# Patient Record
Sex: Female | Born: 1975 | Race: Black or African American | Hispanic: No | Smoking: Former smoker
Health system: Southern US, Community
[De-identification: ages and names within clinical notes are randomized; demographics above are authoritative.]

## PROBLEM LIST (undated history)

## (undated) DIAGNOSIS — D649 Anemia, unspecified: Secondary | ICD-10-CM

## (undated) DIAGNOSIS — F17200 Nicotine dependence, unspecified, uncomplicated: Secondary | ICD-10-CM

## (undated) DIAGNOSIS — E559 Vitamin D deficiency, unspecified: Principal | ICD-10-CM

## (undated) DIAGNOSIS — I10 Essential (primary) hypertension: Secondary | ICD-10-CM

## (undated) DIAGNOSIS — B009 Herpesviral infection, unspecified: Secondary | ICD-10-CM

## (undated) DIAGNOSIS — B379 Candidiasis, unspecified: Secondary | ICD-10-CM

## (undated) DIAGNOSIS — N898 Other specified noninflammatory disorders of vagina: Secondary | ICD-10-CM

## (undated) DIAGNOSIS — K429 Umbilical hernia without obstruction or gangrene: Secondary | ICD-10-CM

## (undated) DIAGNOSIS — Z716 Tobacco abuse counseling: Secondary | ICD-10-CM

## (undated) DIAGNOSIS — F32A Depression, unspecified: Secondary | ICD-10-CM

## (undated) DIAGNOSIS — N760 Acute vaginitis: Secondary | ICD-10-CM

## (undated) DIAGNOSIS — B9689 Other specified bacterial agents as the cause of diseases classified elsewhere: Secondary | ICD-10-CM

## (undated) HISTORY — DX: Herpesviral infection, unspecified: B00.9

## (undated) HISTORY — DX: Candidiasis, unspecified: B37.9

## (undated) HISTORY — DX: Nicotine dependence, unspecified, uncomplicated: F17.200

## (undated) HISTORY — DX: Vitamin D deficiency, unspecified: E55.9

## (undated) HISTORY — DX: Acute vaginitis: N76.0

## (undated) HISTORY — DX: Umbilical hernia without obstruction or gangrene: K42.9

## (undated) HISTORY — DX: Anemia, unspecified: D64.9

## (undated) HISTORY — DX: Tobacco abuse counseling: Z71.6

## (undated) HISTORY — PX: TUBAL LIGATION: SHX77

## (undated) HISTORY — DX: Other specified noninflammatory disorders of vagina: N89.8

## (undated) HISTORY — DX: Other specified bacterial agents as the cause of diseases classified elsewhere: B96.89

---

## 2000-08-24 ENCOUNTER — Emergency Department (HOSPITAL_COMMUNITY): Admission: EM | Admit: 2000-08-24 | Discharge: 2000-08-24 | Payer: Self-pay | Admitting: Emergency Medicine

## 2000-08-24 ENCOUNTER — Encounter: Payer: Self-pay | Admitting: *Deleted

## 2000-10-30 ENCOUNTER — Emergency Department (HOSPITAL_COMMUNITY): Admission: EM | Admit: 2000-10-30 | Discharge: 2000-10-30 | Payer: Self-pay | Admitting: *Deleted

## 2001-05-17 ENCOUNTER — Emergency Department (HOSPITAL_COMMUNITY): Admission: EM | Admit: 2001-05-17 | Discharge: 2001-05-17 | Payer: Self-pay | Admitting: *Deleted

## 2001-09-21 ENCOUNTER — Emergency Department (HOSPITAL_COMMUNITY): Admission: EM | Admit: 2001-09-21 | Discharge: 2001-09-21 | Payer: Self-pay | Admitting: Emergency Medicine

## 2002-02-15 ENCOUNTER — Encounter: Payer: Self-pay | Admitting: Internal Medicine

## 2002-02-15 ENCOUNTER — Ambulatory Visit (HOSPITAL_COMMUNITY): Admission: RE | Admit: 2002-02-15 | Discharge: 2002-02-15 | Payer: Self-pay | Admitting: Internal Medicine

## 2002-03-16 ENCOUNTER — Emergency Department (HOSPITAL_COMMUNITY): Admission: EM | Admit: 2002-03-16 | Discharge: 2002-03-16 | Payer: Self-pay | Admitting: Emergency Medicine

## 2002-03-16 ENCOUNTER — Encounter: Payer: Self-pay | Admitting: Emergency Medicine

## 2002-09-21 ENCOUNTER — Emergency Department (HOSPITAL_COMMUNITY): Admission: EM | Admit: 2002-09-21 | Discharge: 2002-09-21 | Payer: Self-pay | Admitting: Emergency Medicine

## 2002-10-05 ENCOUNTER — Emergency Department (HOSPITAL_COMMUNITY): Admission: EM | Admit: 2002-10-05 | Discharge: 2002-10-05 | Payer: Self-pay | Admitting: Emergency Medicine

## 2003-01-14 ENCOUNTER — Emergency Department (HOSPITAL_COMMUNITY): Admission: EM | Admit: 2003-01-14 | Discharge: 2003-01-14 | Payer: Self-pay | Admitting: Emergency Medicine

## 2003-11-15 ENCOUNTER — Inpatient Hospital Stay (HOSPITAL_COMMUNITY): Admission: AD | Admit: 2003-11-15 | Discharge: 2003-11-15 | Payer: Self-pay | Admitting: *Deleted

## 2004-04-26 ENCOUNTER — Emergency Department (HOSPITAL_COMMUNITY): Admission: EM | Admit: 2004-04-26 | Discharge: 2004-04-26 | Payer: Self-pay | Admitting: *Deleted

## 2004-05-24 ENCOUNTER — Emergency Department (HOSPITAL_COMMUNITY): Admission: EM | Admit: 2004-05-24 | Discharge: 2004-05-24 | Payer: Self-pay | Admitting: Emergency Medicine

## 2004-07-14 ENCOUNTER — Emergency Department (HOSPITAL_COMMUNITY): Admission: EM | Admit: 2004-07-14 | Discharge: 2004-07-14 | Payer: Self-pay | Admitting: Emergency Medicine

## 2004-11-20 ENCOUNTER — Emergency Department (HOSPITAL_COMMUNITY): Admission: EM | Admit: 2004-11-20 | Discharge: 2004-11-20 | Payer: Self-pay | Admitting: Emergency Medicine

## 2005-05-04 ENCOUNTER — Ambulatory Visit: Payer: Self-pay | Admitting: Obstetrics and Gynecology

## 2005-05-04 ENCOUNTER — Inpatient Hospital Stay (HOSPITAL_COMMUNITY): Admission: AD | Admit: 2005-05-04 | Discharge: 2005-05-08 | Payer: Self-pay | Admitting: Obstetrics and Gynecology

## 2005-05-05 ENCOUNTER — Encounter (INDEPENDENT_AMBULATORY_CARE_PROVIDER_SITE_OTHER): Payer: Self-pay | Admitting: *Deleted

## 2005-05-14 ENCOUNTER — Observation Stay (HOSPITAL_COMMUNITY): Admission: AD | Admit: 2005-05-14 | Discharge: 2005-05-15 | Payer: Self-pay | Admitting: Obstetrics and Gynecology

## 2006-01-16 ENCOUNTER — Emergency Department (HOSPITAL_COMMUNITY): Admission: EM | Admit: 2006-01-16 | Discharge: 2006-01-16 | Payer: Self-pay | Admitting: Emergency Medicine

## 2006-01-23 ENCOUNTER — Emergency Department (HOSPITAL_COMMUNITY): Admission: EM | Admit: 2006-01-23 | Discharge: 2006-01-23 | Payer: Self-pay | Admitting: Emergency Medicine

## 2006-09-16 ENCOUNTER — Emergency Department (HOSPITAL_COMMUNITY): Admission: EM | Admit: 2006-09-16 | Discharge: 2006-09-16 | Payer: Self-pay | Admitting: Emergency Medicine

## 2007-02-24 ENCOUNTER — Encounter (INDEPENDENT_AMBULATORY_CARE_PROVIDER_SITE_OTHER): Payer: Self-pay | Admitting: Unknown Physician Specialty

## 2007-02-24 ENCOUNTER — Other Ambulatory Visit: Admission: RE | Admit: 2007-02-24 | Discharge: 2007-02-24 | Payer: Self-pay | Admitting: Unknown Physician Specialty

## 2007-03-26 ENCOUNTER — Emergency Department (HOSPITAL_COMMUNITY): Admission: EM | Admit: 2007-03-26 | Discharge: 2007-03-26 | Payer: Self-pay | Admitting: Emergency Medicine

## 2008-06-27 ENCOUNTER — Emergency Department (HOSPITAL_COMMUNITY): Admission: EM | Admit: 2008-06-27 | Discharge: 2008-06-28 | Payer: Self-pay | Admitting: Emergency Medicine

## 2008-06-28 ENCOUNTER — Ambulatory Visit (HOSPITAL_COMMUNITY): Admission: RE | Admit: 2008-06-28 | Discharge: 2008-06-28 | Payer: Self-pay | Admitting: Emergency Medicine

## 2009-03-26 ENCOUNTER — Emergency Department (HOSPITAL_COMMUNITY): Admission: EM | Admit: 2009-03-26 | Discharge: 2009-03-26 | Payer: Self-pay | Admitting: Emergency Medicine

## 2009-08-29 ENCOUNTER — Emergency Department (HOSPITAL_COMMUNITY): Admission: EM | Admit: 2009-08-29 | Discharge: 2009-08-29 | Payer: Self-pay | Admitting: Emergency Medicine

## 2009-12-10 ENCOUNTER — Emergency Department (HOSPITAL_COMMUNITY)
Admission: EM | Admit: 2009-12-10 | Discharge: 2009-12-10 | Payer: Self-pay | Source: Home / Self Care | Admitting: Emergency Medicine

## 2010-01-15 ENCOUNTER — Other Ambulatory Visit
Admission: RE | Admit: 2010-01-15 | Discharge: 2010-01-15 | Payer: Self-pay | Source: Home / Self Care | Admitting: Obstetrics and Gynecology

## 2010-07-05 LAB — DIFFERENTIAL
Basophils Absolute: 0 10*3/uL (ref 0.0–0.1)
Eosinophils Absolute: 0.1 10*3/uL (ref 0.0–0.7)
Lymphs Abs: 2.2 10*3/uL (ref 0.7–4.0)
Monocytes Absolute: 0.6 10*3/uL (ref 0.1–1.0)
Monocytes Relative: 9 % (ref 3–12)

## 2010-07-05 LAB — CBC
MCV: 83.2 fL (ref 78.0–100.0)
Platelets: 172 10*3/uL (ref 150–400)
RDW: 13.9 % (ref 11.5–15.5)
WBC: 6.2 10*3/uL (ref 4.0–10.5)

## 2010-07-05 LAB — URINALYSIS, ROUTINE W REFLEX MICROSCOPIC
Glucose, UA: NEGATIVE mg/dL
Leukocytes, UA: NEGATIVE
Specific Gravity, Urine: >1.03 — ABNORMAL HIGH (ref 1.005–1.030)
pH: 6 (ref 5.0–8.0)

## 2010-07-05 LAB — BASIC METABOLIC PANEL
BUN: 9 mg/dL (ref 6–23)
Chloride: 103 mEq/L (ref 96–112)
Creatinine, Ser: 0.46 mg/dL (ref 0.4–1.2)

## 2010-07-05 LAB — URINE MICROSCOPIC-ADD ON

## 2010-07-10 LAB — URINE CULTURE

## 2010-07-10 LAB — URINALYSIS, ROUTINE W REFLEX MICROSCOPIC
Glucose, UA: NEGATIVE mg/dL
Specific Gravity, Urine: 1.015 (ref 1.005–1.030)
pH: 6.5 (ref 5.0–8.0)

## 2010-07-10 LAB — URINE MICROSCOPIC-ADD ON

## 2010-07-10 LAB — WET PREP, GENITAL: Yeast Wet Prep HPF POC: NONE SEEN

## 2010-07-26 ENCOUNTER — Encounter (HOSPITAL_COMMUNITY)
Admission: RE | Admit: 2010-07-26 | Discharge: 2010-07-26 | Disposition: A | Discharge status: Home / Self Care | Payer: Medicaid Other | Source: Ambulatory Visit | Attending: Obstetrics and Gynecology | Admitting: Obstetrics and Gynecology

## 2010-07-26 LAB — CBC
Hemoglobin: 11.1 g/dL — ABNORMAL LOW (ref 12.0–15.0)
MCH: 26.2 pg (ref 26.0–34.0)
MCV: 81.4 fL (ref 78.0–100.0)
RBC: 4.24 MIL/uL (ref 3.87–5.11)
WBC: 5.5 10*3/uL (ref 4.0–10.5)

## 2010-07-26 LAB — SURGICAL PCR SCREEN
MRSA, PCR: NEGATIVE
Staphylococcus aureus: NEGATIVE

## 2010-07-26 LAB — URINALYSIS, ROUTINE W REFLEX MICROSCOPIC
Bilirubin Urine: NEGATIVE
Glucose, UA: NEGATIVE mg/dL
Ketones, ur: NEGATIVE mg/dL
Specific Gravity, Urine: 1.015 (ref 1.005–1.030)
pH: 6 (ref 5.0–8.0)

## 2010-07-26 LAB — URINE MICROSCOPIC-ADD ON

## 2010-08-01 ENCOUNTER — Inpatient Hospital Stay (HOSPITAL_COMMUNITY)
Admission: RE | Admit: 2010-08-01 | Discharge: 2010-08-04 | DRG: 766 | Disposition: A | Discharge status: Home / Self Care | Payer: Medicaid Other | Source: Ambulatory Visit | Attending: Obstetrics and Gynecology | Admitting: Obstetrics and Gynecology

## 2010-08-01 DIAGNOSIS — Z01818 Encounter for other preprocedural examination: Secondary | ICD-10-CM

## 2010-08-01 DIAGNOSIS — Z01812 Encounter for preprocedural laboratory examination: Secondary | ICD-10-CM

## 2010-08-01 DIAGNOSIS — O34219 Maternal care for unspecified type scar from previous cesarean delivery: Principal | ICD-10-CM | POA: Diagnosis present

## 2010-08-01 DIAGNOSIS — Z302 Encounter for sterilization: Secondary | ICD-10-CM

## 2010-08-02 LAB — PREGNANCY, URINE: Preg Test, Ur: NEGATIVE

## 2010-08-02 LAB — URINALYSIS, ROUTINE W REFLEX MICROSCOPIC
Bilirubin Urine: NEGATIVE
Nitrite: NEGATIVE
Specific Gravity, Urine: 1.025 (ref 1.005–1.030)
Urobilinogen, UA: 0.2 mg/dL (ref 0.0–1.0)
pH: 6 (ref 5.0–8.0)

## 2010-08-02 LAB — CBC
MCH: 26.3 pg (ref 26.0–34.0)
MCV: 81.9 fL (ref 78.0–100.0)
Platelets: 155 10*3/uL (ref 150–400)
RBC: 4.03 MIL/uL (ref 3.87–5.11)
RDW: 15.5 % (ref 11.5–15.5)
WBC: 6.9 10*3/uL (ref 4.0–10.5)

## 2010-08-03 LAB — RH IMMUNE GLOB WKUP(>/=20WKS)(NOT WOMEN'S HOSP)

## 2010-08-03 NOTE — Op Note (Signed)
[name], [name]             ACCOUNT NO.:  [id number]  MEDICAL RECORD NO.:  [id number]           PATIENT TYPE:  I  LOCATION:  9107                          [facility]ILITY:  [facility]  PHYSICIAN:  Tilda Burrow, M.D. DATE OF BIRTH:  [birth year]  DATE OF PROCEDURE:  08/01/2010 DATE OF DISCHARGE:                              OPERATIVE REPORT   PREOPERATIVE DIAGNOSES:  Pregnancy atm39 weeks, prior cesarean section x1, desiring repeat cesarean section, elective permanent tubal sterilization.  POSTOPERATIVE DIAGNOSES:  Pregnancy atm39 weeks, prior cesarean section x1, desiring repeat cesarean section, elective permanent tubal sterilization.  PROCEDURE:  Repeat low-transverse cervical cesarean section, bilateral sterilization by Filshie clip placement.  SURGEON:  Tilda Burrow, MD, Orvan Falconer, MD  ANESTHESIA:  Spinal.  COMPLICATIONS:  None.  FINDINGS:  8 pounds 5 ounces female infant, Apgars 9 and 9.  INDICATIONS:  A 35 year old female with prior vaginal delivery of small infant and then a cesarean section for her second infant, scheduled for repeat C-section, tubal ligation.  The patient has a very short body habitus and protuberant abdominal wall, as I felt that vaginal delivery would be the technically challenging and not desired by the patient.  DETAILS OF PROCEDURE:  The patient was taken to the operating room, prepped and draped.  Spinal anesthesia introduced.  Time-out conducted, and procedure confirmed by all involved parties.  Ancef 1 g IV was administered preprocedure.  Transverse lower abdominal incision was repeated excising the old cicatrix.  The fascia was opened in the midline.  There was underlying rectus diastase present, and peritoneum was closed over the abdominal cavity.  The peritoneum was opened transversely to stay away from the bladder.  Fascia was dissected off the underlying rectus muscles to give a 12-cm opening to deliver the infant.  Uterine incision  was transverse, fetal vertex rotated into the incision, delivered with fundal pressure, and cord clamped and infant passed to waiting pediatrician.  Cord blood samples were obtained and placenta delivered in response to Crede uterine massage.  There was a marginal cord insertion.  Membranes were extracted and in toto.  Uterus was closed with single layer of running locking 0 Vicryl.  Abdomen was confirmed as being hemostatic.  Tubal sterilization was then performed.  Each tube was identified to its fimbriated end with a midsegment portion of tube captured in a Filshie clip with good positioning confirmed visually.  Anterior peritoneum was closed in a midline vertical position using 2-0 Vicryl.  The rectus muscles were then pulled into the midline using interrupted 0 chromic x2, then 0 Vicryl used to close the fascia in a running fashion.  Figure-of-eight suture of 0 Vicryl was placed in the midline due to a slight separation in the fascial incision.  This was thought due to the scarring encountered on way in.  Subcu tissues were released inferiorly so that 2-0 Vicryl sutures pulled the subcu fatty tissues into reasonable approximation and then subcuticular 4-0 Vicryl on the [place] needle was used to close the skin incision with good cosmetic results.  Pressure dressing will be applied. The patient will go to the recovery room in good condition.  Tilda Burrow, M.D.     JVF/MEDQ  D:  08/01/2010  T:  08/02/2010  Job:  [id number]  cc:   [facility]  [facility]  Electronically Signed by Christin Bach M.D. on 08/03/2010 02:42:04 PM

## 2010-08-03 NOTE — H&P (Signed)
  [name], [name]             ACCOUNT NO.:  [id number]  MEDICAL RECORD NO.:  [id number]           PATIENT TYPE:  LOCATION:                                 FACILITY:  PHYSICIAN:  Tilda Burrow, M.D. DATE OF BIRTH:  11/10/1975  DATE OF ADMISSION:  08/01/2010 DATE OF DISCHARGE:                             HISTORY & PHYSICAL   ADMITTING DIAGNOSES:  Pregnancy [redacted] weeks gestation, prior cesarean section x1 desiring repeat cesarean section, and tubal sterilization.  HISTORY OF PRESENT ILLNESS:  This 35 year old female gravida 3, para 2-0- 0-2, LMP unsure with ultrasound EDC of August 08, 2010 based on 7-week ultrasound and confirmed at 19 weeks 3 days and at 29 weeks is admitted at [redacted] weeks gestation for repeat cesarean section and tubal ligation. Tubal sterilization forms have been signed in our office, June 25, 2010 and confirmed again in prior discussions with the patient.  She declines consideration of trial of labor and will be scheduled for repeat C- section on 3:30 p.m. on August 01, 2010.  PAST MEDICAL HISTORY:  Benign.  PAST SURGICAL HISTORY:  Cesarean section for breech infant in 2007 at [facility] notable for postpartum hypertension.  In 2001, she had a vaginal delivery after 15-hour labor, delivering 6 pounds 3 ounces infant female by vaginal delivery.  ALLERGIES:  None known.  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  FAMILY HISTORY:  Hypertension.  PRENATAL LABORATORY DATA:  Blood type AB negative, receiving RhoGAM April 07, 2010, rubella immune present, hemoglobin 12, hematocrit 38, platelets 200,000 then rechecked at 171,000 thousand.  Hepatitis, HIV, RPR, GC and Chlamydia all negative.  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  MSAFP was declined and 2- hour glucose tolerance test normal at 75, 111, and 97.  She had a female infant, desires circumcision postpartum through [facility],  desires permanent sterilization.  PHYSICAL EXAMINATION:  GENERAL:  Healthy, short-statured African American female. VITAL SIGNS:  Height 4 feet 11 inches and weight 197.4. HEENT:  Pupils equal, round, and reactive. NECK:  Supple. CARDIOVASCULAR:  Unremarkable. ABDOMEN:  Gravid uterus, very protuberant anterior abdominal wall, 37 cm fundal height, and estimated fetal weight 6 pounds. Cervix:  Long, closed, high. EXTREMITIES:  2+ edema without cyanosis or clubbing.  PLAN:  Repeat C-section and tubal ligation, August 01, 2010 at 3:30 p.m.     Tilda Burrow, M.D.     JVF/MEDQ  D:  07/30/2010  T:  07/31/2010  Job:  [id number]  cc:   [facility]  Electronically Signed by Christin Bach M.D. on 08/03/2010 02:41:57 PM

## 2010-08-08 NOTE — Discharge Summary (Signed)
  [name], [name]             ACCOUNT NO.:  [id number]  MEDICAL RECORD NO.:  [id number]           PATIENT TYPE:  I  LOCATION:  9107                          [facility]  [facility]  PHYSICIAN:  Tilda Burrow, M.D. DATE OF BIRTH:  15-Mar-1976  DATE OF ADMISSION:  08/01/2010 DATE OF DISCHARGE:                              DISCHARGE SUMMARY   REASON FOR ADMISSION:  Repeat C-section and tubal ligation.  PROCEDURES PRENATAL:  None.  PROCEDURES INTRAPARTUM:  Low cervical transverse cesarean section, tubal ligation.  PROCEDURES POSTPARTUM:  None.  COMPLICATIONS:  Operative and postpartum none.  DISCHARGE DIAGNOSIS:  Term pregnancy, delivered.  DISCHARGE INFORMATION:  Discharge on August 04, 2010.  ACTIVITY:  No heavy lifting.  DIET:  Routine.  MEDICATIONS:  None.  STATUS:  Well.  INSTRUCTIONS:  Routine.  Discharge to home.  Follow up in 6 weeks in [facility].  C-section was without complications and delivered a viable female with Apgar as of 9 and 9 with a stable postoperative CBC, and stable vital signs.    ______________________________ Edd Arbour, MD   ______________________________ Tilda Burrow, M.D.   JO/MEDQ  D:  08/04/2010  T:  08/04/2010  Job:  [id number]  Electronically Signed by Edd Arbour MD on 08/07/2010 09:50:21 PM Electronically Signed by Christin Bach M.D. on 08/08/2010 04:31:51 PM

## 2010-09-07 NOTE — H&P (Signed)
NAME:  [name], AZIZI NO.:  [id number]   MEDICAL RECORD NO.:  [id number]          PATIENT TYPE:  INP   LOCATION:  A428                          FACILITY:  [facility]   PHYSICIAN:  Tilda Burrow, M.D. DATE OF BIRTH:  [birth year]   DATE OF ADMISSION:  DATE OF DISCHARGE:  LH                                HISTORY & PHYSICAL   ADMITTING DIAGNOSIS:  Pregnancy-induced hypertension.   HISTORY:  This 35 year old female, gravida 1, para 1, now 11 days status  post cesarean section to [facility] for breech presentation with  membrane rupture, is admitted at this time for management of postpartum  preeclampsia.  She presents to our office complaining of headache since she  woke up this morning.  Denies right upper quadrant pain.  She also had an  odor from the incision, which is actually why she came in.  On physical  exam, her weight is 158.  Blood pressure 190/110 (rechecked after sitting  for some time at 176/110 to 112).  Urine is bloody, but shows trace protein,  2+ blood, 2+ leukocytes.  Reflexes show 3+ reflexes without clonus.  She has  no right upper quadrant pain.   Given the absence of any idea of how her blood pressures have been recently,  we are admitting her for treatment of her presumed preeclampsia.   LABORATORY DATA:  Liver function tests and cath urinalysis will be  performed.   She will be begun on labetalol 100 mg b.i.d. with the addition of Lasix if  necessary.  I anticipate a brief hospitalization and conversion to  outpatient therapy with blood pressures improved.   PAST MEDICAL HISTORY:  Benign.  Specifically, blood pressure is not  mentioned as elevated at recent delivery.   SURGICAL HISTORY:  Cesarean section at [facility] 11 days ago.   PHYSICAL EXAMINATION:  VITAL SIGNS:  Weight 158.  Blood pressure 190/110  (rechecked at 176/112).  HEENT:  Pupils equally round and reactive.  Not particularly edematous.  NECK:  Supple.  CHEST:   Clear to auscultation.  ABDOMEN:  No right upper quadrant pain on abdominal exam.  Lower abdominal  laxity traps moisture over the incision, which has a large flap where the  skin edge is separated, and the superior edge of the incision has developed  approximately a 2 cm long flap protruding over the skin edge of the lower  portion of the incision.  PELVIC:  External genitalia is normal.  Pelvic exam specifically denied.  EXTREMITIES:  Not edematous past 1+, but have 4+ reflexes without clonus.   PLAN:  Admit.  Magnesium sulfate therapy overnight.  Convert to outpatient  management once labetalol has taken effect.   ADDENDUM:  The patient is bottle feeding.      Tilda Burrow, M.D.  Electronically Signed     JVF/MEDQ  D:  05/14/2005  T:  05/14/2005  Job:  [id number]

## 2010-09-07 NOTE — Discharge Summary (Signed)
[name], [name] NO.:  [id number]   MEDICAL RECORD NO.:  [id number]          PATIENT TYPE:  INP   LOCATION:  9125                          [facility]ILITY:  [facility]   PHYSICIAN:  Phil D. Okey Dupre, M.D.     DATE OF BIRTH:  11/09/1975   DATE OF ADMISSION:  05/04/2005  DATE OF DISCHARGE:  05/08/2005                                 DISCHARGE SUMMARY   DISCHARGE DIAGNOSES:  Intrauterine pregnancy at 33-2/7 weeks, delivered by  low transverse cesarean section due to spontaneous onset of labor in  footling breech positioning performed on May 05, 2005.   DISCHARGE MEDICATIONS:  1.  Colace 100 mg one tablet p.o. daily as stool softener.  2.  Ibuprofen 600 mg one tablet q.6h. p.r.n. pain.  3.  Percocet 5/325 one tablet p.r.n. q.6h. p.r.n. for severe pain.   BRIEF HOSPITAL COURSE:  [name] is a 35 year old gravida 2, para 1-0-  0-1 who presented with premature rupture of membranes on May 04, 2005 to  the MAU with a dilated cervix of 2 cm, ruptured membranes and with a  footling breech presentation.  It was decided to take the patient back for a  low transverse cesarean section to be performed by Dr. Okey Dupre and Dr.  Irving Burton.  Please see the dictated operative note on May 04, 2005.  Patient had estimated blood loss of 500 mL.  Baby weighed 5 pounds 6 ounces,  viable female in footling breech presentation.  Cesarean section was carried  out under spinal anesthesia.  The patient will be bottle feeding.  The  Apgars were 7 and 9.  Placenta was removed manually.  Patient was taken to  the PACU in stable condition and was then transferred out to mother/baby  floor.  Patient did well status post delivery without complications.  Blood  pressures were stable.  Blood type was AB-.  Antibody was negative.  Baby  had a positive antibody and was given RhoGAM status post discharge.  Patient's RPR was nonreactive.  She was rubella immune.  Her hepatitis B  surface antigen was  negative.  HIV was nonreactive.  Her postpartum  hemoglobin was 9.7 on May 06, 2005.  The patient will be using Depo  Provera 150 mg IM x1 prior to discharge.  Her staples will be removed prior  to discharge.  She will follow up with Dr. Emelda Fear at [facility]  outside of [facility] in [place] for her six-week postpartum  check.  Patient is stable now, ready for discharge.      Barth Kirks, M.D.    ______________________________  Javier Glazier. Okey Dupre, M.D.    MB/MEDQ  D:  05/08/2005  T:  05/08/2005  Job:  [id number]   cc:   Tilda Burrow, M.D.  Fax: [phone]

## 2010-09-07 NOTE — Discharge Summary (Signed)
NAME:  [name], FULTS NO.:  [id number]   MEDICAL RECORD NO.:  [id number]          PATIENT TYPE:  OIB   LOCATION:  A415                          FACILITY:  [facility]   PHYSICIAN:  Lazaro Arms, M.D.   DATE OF BIRTH:  [birth year]   DATE OF ADMISSION:  05/04/2005  DATE OF DISCHARGE:  LH                                 DISCHARGE SUMMARY   ADMISSION AND TRANSFER SUMMARY:  [name] is a 35 year old African-American  female gravida 2, para 1 with estimated date of delivery of June 20, 2005 by  a sure last menstrual period and confirmatory 6-week ultrasound currently at  32 and 2/[redacted] weeks gestation who presented to labor and delivery tonight  complaining of spontaneous rupture of membranes at 1900.  When she presented  to labor and delivery, she was brought up with a towel because she was  leaking so much.  She is Nitrazine positive.  A sterile speculum exam here  in labor and delivery confirms gross ruptured membranes.  An ultrasound also  reveals the baby to be in a breech presentation.  It appears to be  incomplete breech.  On cervical exam, she was not prolapsing the foot, but  small parts could be felt so I think she is incomplete with no leg prolapse.  She was not reassessed a second time to try to diminish cervical exams.  Group B strep culture is done of the vagina and perineum.  She was started  on ampicillin after the culture was obtained.  I called [facility] and  talked with Dr. Okey Dupre, and she is being transferred because of pediatric  possible need for respiratory support in a neonatal ICU.   PAST MEDICAL HISTORY:  Negative.   PAST SURGICAL HISTORY:  Negative.   ALLERGIES:  None.   MEDICATIONS:  Prenatal vitamins.   PAST OBSTETRIC HISTORY:  She had a vaginal delivery back in 2001 of 6 pound  13 ounce at 40 weeks.  No pregnancy complications.  Her blood type is AB  negative.  Antibody screen is negative.  UDS was negative.  Hepatitis B was  negative.   HIV was non reactive.  Serology was non reactive both at the  beginning of pregnancy and at 28 weeks.  Her Pap was normal.  GC and  chlamydia were negative.  She declined an AFP test.  Her Glucola was normal.   Abdominal exam reveals a rectus diastasis.  She is in __________ breech  presentation which is confirmed by ultrasound, and she is found to be in  incomplete breech with no prolapse of the foot through the cervix.   IMPRESSION:  1.  Intrauterine pregnancy at 40 and 2/[redacted] weeks gestation.  2.  Premature ruptured membranes.  3.  Mild uterine activity status post terbutaline times 1.   PLAN:  The patient is being readied for transfer to [facility].  She  has had a group B strep culture obtained.  Her ampicillin is hanging.  She  understands that she will require cesarean section for delivery because of  the presentation of the baby.  She also understands the reason for being  transferred to [facility] and agrees.      Lazaro Arms, M.D.  Electronically Signed     LHE/MEDQ  D:  05/04/2005  T:  05/04/2005  Job:  [id number]

## 2010-09-07 NOTE — Op Note (Signed)
NAMEJAMERIAH, TROTTI NO.:  [id number]   MEDICAL RECORD NO.:  [id number]          PATIENT TYPE:  INP   LOCATION:  9125                          [facility]ILITY:  [facility]   PHYSICIAN:  Phil D. Okey Dupre, M.D.     DATE OF BIRTH:  27-Dec-1975   DATE OF PROCEDURE:  05/05/2005  DATE OF DISCHARGE:                                 OPERATIVE REPORT   PROCEDURE:  Low transverse C-section.   PREOPERATIVE DIAGNOSIS:  33-2/7 weeks' gestation, breech presentation with  premature ruptured membranes.   SURGEON:  Dr. Okey Dupre   FIRST ASSISTANT:  Angeline Slim, M.D.   ANESTHESIA:  Was spinal.   ESTIMATED BLOOD LOSS:  500 mL.   SPECIMENS:  Placenta to pathology.   FINDINGS:  Pecola Leisure was female weighing 5 pounds 6 ounces.   DESCRIPTION OF PROCEDURE:  Under satisfactory spinal anesthesia with the  patient in dorsolithotomy position. A Foley catheter in urinary bladder, the  abdomen was entered by a transverse semilunar incision situated 3 cm above  the symphysis pubis extending for a total of 14 cm. Abdomen was entered by  layers. On entering the peritoneal cavity visceroperitoneum and the anterior  surface were opened transversely by sharp dissection. Bladder pushed away  the lower uterine segment which was entered by sharp and blunt dissection  and from a single footling presentation with a LSA, the baby was delivered  by breech extraction. The airway suctioned with bulb suction. Cord doubly  clamped and divided, baby handed pediatrician. Samples taken from the cord  for analysis. Placenta manually removed. The uterus explored and closed with  continuous running locked 0 Vicryl in an atraumatic needle. There was a  bleeder in the area of the left lateral angle of the incision which was  controlled with a figure-of-eight of the same material. Fascia was then  closed with continuous running locked 0 Vicryl on an atraumatic needle.  Subcutaneous bleeders controlled with hot cautery. Dry sterile  dressing was  applied. Each layer was irrigated on exit. The patient transferred to  recovery room in satisfactory condition with 500 mL blood loss. Tape,  instrument, sponge and needle count reported correct at the end of the  procedure.           ______________________________  Javier Glazier. Okey Dupre, M.D.     PDR/MEDQ  D:  05/05/2005  T:  05/06/2005  Job:  [id number]

## 2010-10-15 ENCOUNTER — Emergency Department (HOSPITAL_COMMUNITY)
Admission: EM | Admit: 2010-10-15 | Discharge: 2010-10-16 | Disposition: A | Discharge status: Home / Self Care | Payer: Medicaid Other | Attending: Emergency Medicine | Admitting: Emergency Medicine

## 2010-10-15 DIAGNOSIS — I10 Essential (primary) hypertension: Secondary | ICD-10-CM | POA: Insufficient documentation

## 2011-01-28 LAB — STREP A DNA PROBE

## 2011-01-28 LAB — RAPID STREP SCREEN (MED CTR MEBANE ONLY): Streptococcus, Group A Screen (Direct): NEGATIVE

## 2011-02-08 ENCOUNTER — Emergency Department (HOSPITAL_COMMUNITY)
Admission: EM | Admit: 2011-02-08 | Discharge: 2011-02-08 | Disposition: A | Discharge status: Home / Self Care | Payer: Medicaid Other | Attending: Emergency Medicine | Admitting: Emergency Medicine

## 2011-02-08 ENCOUNTER — Encounter: Payer: Self-pay | Admitting: Emergency Medicine

## 2011-02-08 DIAGNOSIS — M545 Low back pain, unspecified: Secondary | ICD-10-CM | POA: Insufficient documentation

## 2011-02-08 DIAGNOSIS — M549 Dorsalgia, unspecified: Secondary | ICD-10-CM

## 2011-02-08 DIAGNOSIS — I10 Essential (primary) hypertension: Secondary | ICD-10-CM | POA: Insufficient documentation

## 2011-02-08 DIAGNOSIS — M546 Pain in thoracic spine: Secondary | ICD-10-CM | POA: Insufficient documentation

## 2011-02-08 DIAGNOSIS — Y9269 Other specified industrial and construction area as the place of occurrence of the external cause: Secondary | ICD-10-CM | POA: Insufficient documentation

## 2011-02-08 DIAGNOSIS — X500XXA Overexertion from strenuous movement or load, initial encounter: Secondary | ICD-10-CM | POA: Insufficient documentation

## 2011-02-08 HISTORY — DX: Essential (primary) hypertension: I10

## 2011-02-08 LAB — URINALYSIS, ROUTINE W REFLEX MICROSCOPIC
Leukocytes, UA: NEGATIVE
Nitrite: NEGATIVE
Specific Gravity, Urine: 1.015 (ref 1.005–1.030)
pH: 7 (ref 5.0–8.0)

## 2011-02-08 LAB — POCT PREGNANCY, URINE: Preg Test, Ur: NEGATIVE

## 2011-02-08 MED ORDER — HYDROCODONE-ACETAMINOPHEN 5-325 MG PO TABS
1.0000 | ORAL_TABLET | Freq: Once | ORAL | Status: AC
  Administered 2011-02-08: 1 via ORAL
  Filled 2011-02-08: qty 1

## 2011-02-08 MED ORDER — CYCLOBENZAPRINE HCL 10 MG PO TABS: 10.0000 mg | ORAL_TABLET | Freq: Three times a day (TID) | ORAL | Status: AC | PRN

## 2011-02-08 MED ORDER — CYCLOBENZAPRINE HCL 10 MG PO TABS
10.0000 mg | ORAL_TABLET | Freq: Once | ORAL | Status: AC
  Administered 2011-02-08: 10 mg via ORAL
  Filled 2011-02-08 (×2): qty 1

## 2011-02-08 MED ORDER — HYDROCODONE-ACETAMINOPHEN 5-325 MG PO TABS: ORAL_TABLET | ORAL | Status: AC

## 2011-02-08 NOTE — ED Notes (Signed)
Pt c/o mid back pain since yesterday.

## 2011-02-08 NOTE — ED Provider Notes (Signed)
History     CSN: [id number] Arrival date & time: 02/08/2011  4:02 PM   First MD Initiated Contact with Patient 02/08/11 1617      Chief Complaint  Patient presents with  . Back Pain    (Consider location/radiation/quality/duration/timing/severity/associated sxs/prior treatment) HPI Comments: Patient c/o pain to her middle and lower back since yesterday.  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  Thinks she may have injured her back at work.  She denies pain or numbness to her legs, chest or abdomen.  Pain is reproduced with movement, bending over or twisting.  She also denies incontinence or dysuria.    Patient is a 35 y.o. female presenting with back pain. The history is provided by the patient.  Back Pain  This is a new problem. The current episode started yesterday. The problem occurs constantly. The problem has not changed since onset.The pain is associated with lifting heavy objects and twisting. The pain is present in the lumbar spine and thoracic spine. The quality of the pain is described as aching. The pain does not radiate. The pain is moderate. The symptoms are aggravated by bending, twisting and certain positions. The pain is the same all the time. Pertinent negatives include no chest pain, no fever, no numbness, no abdominal pain, no abdominal swelling, no bowel incontinence, no perianal numbness, no bladder incontinence, no dysuria, no pelvic pain, no leg pain, no paresis, no tingling and no weakness. She has tried nothing for the symptoms. The treatment provided no relief.    Past Medical History  Diagnosis Date  . Hypertension     Past Surgical History  Procedure Date  . Cesarean section     History reviewed. No pertinent family history.  History  Substance Use Topics  . Smoking status: Current Everyday Smoker  . Smokeless tobacco: Not on file  . Alcohol Use: No    OB History    Grav Para Term Preterm Abortions TAB SAB Ect Mult  Living                  Review of Systems  Constitutional: Negative for fever, chills and fatigue.  HENT: Negative for sore throat, trouble swallowing, neck pain and neck stiffness.   Respiratory: Negative for cough, shortness of breath and wheezing.   Cardiovascular: Negative for chest pain and palpitations.  Gastrointestinal: Negative for nausea, vomiting, abdominal pain, blood in stool and bowel incontinence.  Genitourinary: Negative for bladder incontinence, dysuria, hematuria, flank pain, vaginal bleeding, vaginal discharge, difficulty urinating, vaginal pain and pelvic pain.  Musculoskeletal: Positive for back pain. Negative for myalgias and arthralgias.  Skin: Negative for rash.  Neurological: Negative for dizziness, tingling, weakness and numbness.  Hematological: Does not bruise/bleed easily.    Allergies  Review of patient's allergies indicates no known allergies.  Home Medications   Current Outpatient Rx  Name Route Sig Dispense Refill  . ACYCLOVIR 400 MG PO TABS Oral Take 400 mg by mouth 3 (three) times daily.      Marland Kitchen HYDROCHLOROTHIAZIDE 12.5 MG PO CAPS Oral Take 12.5 mg by mouth daily.      . CYCLOBENZAPRINE HCL 10 MG PO TABS Oral Take 1 tablet (10 mg total) by mouth 3 (three) times daily as needed for muscle spasms. 21 tablet 0  . HYDROCODONE-ACETAMINOPHEN 5-325 MG PO TABS  Take one-two tabs po q 4-6 hrs prn pain 16 tablet 0    BP 118/69  Pulse 84  Temp(Src) 98.7  F (37.1 C) (Oral)  Resp 20  Ht 4\' 11"  (1.499 m)  Wt 140 lb (63.504 kg)  BMI 28.28 kg/m2  SpO2 100%  LMP 01/24/2011  Physical Exam  Nursing note and vitals reviewed. Constitutional: She is oriented to person, place, and time. She appears well-developed and well-nourished. No distress.  HENT:  Head: Normocephalic and atraumatic.  Mouth/Throat: Oropharynx is clear and moist.  Neck: Normal range of motion. Neck supple.  Cardiovascular: Normal rate, regular rhythm and normal heart sounds.     Pulmonary/Chest: Effort normal and breath sounds normal. No respiratory distress. She exhibits no tenderness.  Abdominal: Soft. She exhibits no distension. There is no tenderness.  Musculoskeletal: Normal range of motion. She exhibits no tenderness.       Lumbar back: She exhibits tenderness. She exhibits normal range of motion, no bony tenderness, no swelling, no edema, no deformity, no laceration, no spasm and normal pulse.       No CVA tenderness  Lymphadenopathy:    She has no cervical adenopathy.  Neurological: She is alert and oriented to person, place, and time. No cranial nerve deficit or sensory deficit. She exhibits normal muscle tone. Coordination and gait normal.  Reflex Scores:      Patellar reflexes are 2+ on the right side and 2+ on the left side.      Achilles reflexes are 2+ on the right side and 2+ on the left side. Skin: Skin is warm and dry.    ED Course  Procedures (including critical care time)  Results for orders placed during the hospital encounter of 02/08/11  URINALYSIS, ROUTINE W REFLEX MICROSCOPIC      Component Value Range   Color, Urine YELLOW  YELLOW    Appearance CLEAR  CLEAR    Specific Gravity, Urine 1.015  1.005 - 1.030    pH 7.0  5.0 - 8.0    Glucose, UA NEGATIVE  NEGATIVE (mg/dL)   Hgb urine dipstick NEGATIVE  NEGATIVE    Bilirubin Urine NEGATIVE  NEGATIVE    Ketones, ur NEGATIVE  NEGATIVE (mg/dL)   Protein, ur NEGATIVE  NEGATIVE (mg/dL)   Urobilinogen, UA 1.0  0.0 - 1.0 (mg/dL)   Nitrite NEGATIVE  NEGATIVE    Leukocytes, UA NEGATIVE  NEGATIVE   POCT PREGNANCY, URINE      Component Value Range   Preg Test, Ur NEGATIVE        1. Back pain    Patient is ambulatory, no focal neuro deficits on exam.  Non-toxic appearing.  ttp of the paraspinal muscles of the thoracolumbar region.     MDM      Pt feels improved after observation and/or treatment in ED.   Patient / Family / Caregiver understand and agree with initial ED impression and  plan with expectations set for ED visit.  I have advised pt to return to ED for any worsening sx's.  She agrees to care plan and verbalized understanding    Felis Quillin L. [place], [place] 02/08/11 1829

## 2011-02-09 NOTE — ED Notes (Signed)
Pt returned to er today, stating that her employer did not have light restrictions and that she felt fine to go back to work on her normal duty, spoke with Dr. Doylene Canard who advised that if pt felt fine to go back to work to write new work note allowing pt to return to work with normal work duties. New work note written and given to pt

## 2011-02-09 NOTE — ED Provider Notes (Signed)
Medical screening examination/treatment/procedure(s) were performed by non-physician practitioner and as supervising physician I was immediately available for consultation/collaboration.  Graysen Depaula, MD 02/09/11 0021 

## 2011-10-11 ENCOUNTER — Emergency Department (HOSPITAL_COMMUNITY)
Admission: EM | Admit: 2011-10-11 | Discharge: 2011-10-12 | Disposition: A | Discharge status: Home / Self Care | Payer: Medicaid Other | Attending: Emergency Medicine | Admitting: Emergency Medicine

## 2011-10-11 ENCOUNTER — Encounter (HOSPITAL_COMMUNITY): Payer: Self-pay | Admitting: *Deleted

## 2011-10-11 DIAGNOSIS — I10 Essential (primary) hypertension: Secondary | ICD-10-CM | POA: Insufficient documentation

## 2011-10-11 DIAGNOSIS — L723 Sebaceous cyst: Secondary | ICD-10-CM | POA: Insufficient documentation

## 2011-10-11 DIAGNOSIS — F17200 Nicotine dependence, unspecified, uncomplicated: Secondary | ICD-10-CM | POA: Insufficient documentation

## 2011-10-11 DIAGNOSIS — L729 Follicular cyst of the skin and subcutaneous tissue, unspecified: Secondary | ICD-10-CM

## 2011-10-11 NOTE — ED Notes (Signed)
Pt reporting pain in right groin area.  States it is non-painful.  Pedal pulse palpable.

## 2011-10-12 NOTE — ED Notes (Signed)
Pt alert & oriented x4, stable gait. Pt given discharge instructions, paperwork. Patient instructed to stop at the registration desk to finish any additional paperwork. pt verbalized understanding. Pt left department w/ no further questions.  

## 2011-10-12 NOTE — ED Provider Notes (Signed)
Medical screening examination/treatment/procedure(s) were performed by non-physician practitioner and as supervising physician I was immediately available for consultation/collaboration.   Belva Koziel M Conlin Brahm, DO 10/12/11 0249 

## 2011-10-12 NOTE — ED Provider Notes (Signed)
History     CSN: [id number]  Arrival date & time 10/11/11  2329   First MD Initiated Contact with Patient 10/12/11 0003      No chief complaint on file.   (Consider location/radiation/quality/duration/timing/severity/associated sxs/prior treatment) HPI Comments: Pt states she noted a "knot" on the right side tonight. No previous problem. No pain or drainage. No fever. Pt states she needed to have this checked out tonight because it was causing a great deal of fear.  The history is provided by the patient.    Past Medical History  Diagnosis Date  . Hypertension     Past Surgical History  Procedure Date  . Cesarean section     History reviewed. No pertinent family history.  History  Substance Use Topics  . Smoking status: Current Everyday Smoker -- 0.5 packs/day  . Smokeless tobacco: Not on file  . Alcohol Use: No    OB History    Grav Para Term Preterm Abortions TAB SAB Ect Mult Living                  Review of Systems  Constitutional: Negative for activity change.       All ROS Neg except as noted in HPI  HENT: Negative for nosebleeds and neck pain.   Eyes: Negative for photophobia and discharge.  Respiratory: Negative for cough, shortness of breath and wheezing.   Cardiovascular: Negative for chest pain and palpitations.  Gastrointestinal: Negative for abdominal pain and blood in stool.  Genitourinary: Negative for dysuria, frequency and hematuria.  Musculoskeletal: Negative for back pain and arthralgias.  Skin: Negative.   Neurological: Negative for dizziness, seizures and speech difficulty.  Psychiatric/Behavioral: Negative for hallucinations and confusion.    Allergies  Review of patient's allergies indicates no known allergies.  Home Medications   Current Outpatient Rx  Name Route Sig Dispense Refill  . ACYCLOVIR 400 MG PO TABS Oral Take 400 mg by mouth 3 (three) times daily.      Marland Kitchen HYDROCHLOROTHIAZIDE 12.5 MG PO CAPS Oral Take 12.5 mg by mouth  daily.        BP 120/84  Pulse 83  Temp 98.3 F (36.8 C) (Oral)  Resp 18  Ht 4\' 11"  (1.499 m)  Wt 130 lb (58.968 kg)  BMI 26.26 kg/m2  SpO2 100%  LMP 10/04/2011  Physical Exam  Nursing note and vitals reviewed. Constitutional: She is oriented to person, place, and time. She appears well-developed and well-nourished.  Non-toxic appearance.  HENT:  Head: Normocephalic.  Right Ear: Tympanic membrane and external ear normal.  Left Ear: Tympanic membrane and external ear normal.  Eyes: EOM and lids are normal. Pupils are equal, round, and reactive to light.  Neck: Normal range of motion. Neck supple. Carotid bruit is not present.  Cardiovascular: Normal rate, regular rhythm, normal heart sounds, intact distal pulses and normal pulses.   Pulmonary/Chest: Breath sounds normal. No respiratory distress.  Abdominal: Soft. Bowel sounds are normal. There is no tenderness. There is no guarding.  Genitourinary:       Less than 1cm cyst of the right inguinal area. Freely movable and non tender. No other cyst noted. Chaperone present during exam.  Musculoskeletal: Normal range of motion.  Lymphadenopathy:       Head (right side): No submandibular adenopathy present.       Head (left side): No submandibular adenopathy present.    She has no cervical adenopathy.  Neurological: She is alert and oriented to person, place, and time.  She has normal strength. No cranial nerve deficit or sensory deficit.  Skin: Skin is warm and dry.  Psychiatric: Her speech is normal. Her mood appears anxious.    ED Course  Procedures (including critical care time)  Labs Reviewed - No data to display No results found.   1. Cyst of skin       MDM  I have reviewed nursing notes, vital signs, and all appropriate lab and imaging results for this patient. Pt reassured that she has a cyst of the soft tissue. She is to see a surgeon if the cyst becomes larger or causes pain.       Kathie Dike,  [place] 10/12/11 [phone]

## 2011-10-12 NOTE — Discharge Instructions (Signed)
You have a cyst under the skin. No need for surgical intervention at this time. See Dr Lovell Sheehan (surgeon) if the cyst becomes larger, or causes pain.

## 2011-10-12 NOTE — ED Notes (Signed)
Pt reports knot to right groin. States she felt it today.

## 2012-07-10 ENCOUNTER — Ambulatory Visit: Payer: Self-pay | Admitting: Obstetrics & Gynecology

## 2012-07-14 ENCOUNTER — Ambulatory Visit: Payer: Self-pay | Admitting: Obstetrics & Gynecology

## 2012-09-03 ENCOUNTER — Other Ambulatory Visit: Payer: Self-pay | Admitting: Obstetrics and Gynecology

## 2012-09-15 ENCOUNTER — Encounter: Payer: Self-pay | Admitting: *Deleted

## 2012-09-16 ENCOUNTER — Other Ambulatory Visit: Payer: Self-pay | Admitting: Obstetrics and Gynecology

## 2012-09-28 ENCOUNTER — Other Ambulatory Visit (HOSPITAL_COMMUNITY)
Admission: RE | Admit: 2012-09-28 | Discharge: 2012-09-28 | Disposition: A | Discharge status: Home / Self Care | Payer: Medicaid Other | Source: Ambulatory Visit | Attending: Obstetrics and Gynecology | Admitting: Obstetrics and Gynecology

## 2012-09-28 ENCOUNTER — Ambulatory Visit (INDEPENDENT_AMBULATORY_CARE_PROVIDER_SITE_OTHER): Payer: PRIVATE HEALTH INSURANCE | Admitting: Obstetrics and Gynecology

## 2012-09-28 ENCOUNTER — Encounter: Payer: Self-pay | Admitting: Obstetrics and Gynecology

## 2012-09-28 VITALS — BP 122/78 | Ht 59.0 in | Wt 128.0 lb

## 2012-09-28 DIAGNOSIS — Z01419 Encounter for gynecological examination (general) (routine) without abnormal findings: Secondary | ICD-10-CM

## 2012-09-28 DIAGNOSIS — Z1151 Encounter for screening for human papillomavirus (HPV): Secondary | ICD-10-CM | POA: Insufficient documentation

## 2012-09-28 DIAGNOSIS — B009 Herpesviral infection, unspecified: Secondary | ICD-10-CM | POA: Insufficient documentation

## 2012-09-28 MED ORDER — ACYCLOVIR 400 MG PO TABS: 400.0000 mg | ORAL_TABLET | Freq: Every day | ORAL | Status: DC

## 2012-09-28 NOTE — Progress Notes (Signed)
Patient ID: [name], female   DOB: [birth year], 37 y.o.   MRN: [id number] Patient here today for her yearly exam, LMP 09/18/2012 Assessment:  Normal Gyn Exam  hx HSV -II infection remote Plan:  1. Mammogram 2. pap smear done, next pap due 5 yr 3. return annually or prn 4,   {t desires to treat episodically for HSV-II not chronic supression Subjective:  [name] is a 37 y.o. female G2P1100 who presents for annual exam.  The patient has complaints today of [place] -- ice   The following portions of the patient's history were reviewed and updated as appropriate: allergies, current medications, past family history, past medical history, past social history, past surgical history and problem list.  Review of Systems Pertinent items are noted in HPI.  Objective:  BP 122/78  Ht 4\' 11"  (1.499 m)  Wt 128 lb (58.06 kg)  BMI 25.84 kg/m2  LMP 09/18/2012  BMI: Body mass index is 25.84 kg/(m^2). General Appearance: Alert, appropriate appearance for age. No acute distress HEENT: Grossly normal Neck / Thyroid:  Cardiovascular: RRR; normal S1, S2, no murmur Lungs: CTA bilaterally Back: No CVAT Breast Exam: No dimpling, nipple retraction or discharge. No masses or nodes. and No masses or nodes.No dimpling, nipple retraction or discharge. Gastrointestinal: Soft, non-tender, no masses or organomegaly Pelvic Exam: Vulva and vagina appear normal. Bimanual exam reveals normal uterus and adnexa. Rectovaginal: not indicated Lymphatic Exam: Non-palpable nodes in neck, clavicular, axillary, or inguinal regions Skin: no rash or abnormalities Neurologic: Normal gait and speech, no tremor  Psychiatric: Alert and oriented, appropriate affect.  Urinalysis:normal  Christin Bach. MD Pgr [phone] 3:49 PM

## 2012-09-28 NOTE — Patient Instructions (Signed)
Herpes Simplex Herpes simplex is generally classified as Type 1 or Type 2. Type 1 is generally the type that is responsible for cold sores. Type 2 is generally associated with sexually transmitted diseases. We now know that most of the thoughts on these viruses are inaccurate. We find that HSV1 is also present genitally and HSV2 can be present orally, but this will vary in different locations of the world. Herpes simplex is usually detected by doing a culture. Blood tests are also available for this virus; however, the accuracy is often not as good.  PREPARATION FOR TEST No preparation or fasting is necessary. NORMAL FINDINGS  No virus present  No HSV antigens or antibodies present Ranges for normal findings may vary among different laboratories and hospitals. You should always check with your doctor after having lab work or other tests done to discuss the meaning of your test results and whether your values are considered within normal limits. MEANING OF TEST  Your caregiver will go over the test results with you and discuss the importance and meaning of your results, as well as treatment options and the need for additional tests if necessary. OBTAINING THE TEST RESULTS  It is your responsibility to obtain your test results. Ask the lab or department performing the test when and how you will get your results. Document Released: 05/11/2004 Document Revised: 07/01/2011 Document Reviewed: 03/19/2008 [employer] Patient Information 2014 [employer].  

## 2012-10-09 ENCOUNTER — Telehealth: Payer: Self-pay | Admitting: *Deleted

## 2012-10-09 NOTE — Telephone Encounter (Signed)
Spoke with pt. Pap results are normal and HPV was not detected. Pt aware. JSY

## 2012-12-29 ENCOUNTER — Telehealth: Payer: Self-pay | Admitting: Adult Health

## 2012-12-29 MED ORDER — FLUCONAZOLE 150 MG PO TABS: 150.0000 mg | ORAL_TABLET | Freq: Once | ORAL | Status: DC

## 2012-12-29 NOTE — Telephone Encounter (Signed)
Spoke with pt. Has a yeast infection. Symptoms started yest. Can you call in Diflucan? Uses [facility] in [place]. Thanks!!!!

## 2012-12-29 NOTE — Telephone Encounter (Signed)
Pt aware Diflucan e prescribed. JSY

## 2013-02-25 ENCOUNTER — Other Ambulatory Visit: Payer: Self-pay

## 2013-07-09 ENCOUNTER — Telehealth: Payer: Self-pay | Admitting: *Deleted

## 2013-07-09 ENCOUNTER — Telehealth: Payer: Self-pay

## 2013-07-09 MED ORDER — FLUCONAZOLE 150 MG PO TABS: 150.0000 mg | ORAL_TABLET | Freq: Once | ORAL | Status: DC

## 2013-07-09 NOTE — Telephone Encounter (Signed)
Left message letting pt know Diflucan Rx has been sent to pharmacy. JSY

## 2013-07-09 NOTE — Telephone Encounter (Signed)
Pt having symptoms of a yeast infection. Can you order med? Uses [facility] in [place]. Thanks!!

## 2013-07-09 NOTE — Telephone Encounter (Signed)
Left message [facility] pt [facility] [place] JSY

## 2013-11-04 ENCOUNTER — Other Ambulatory Visit: Payer: Medicaid Other | Admitting: Obstetrics and Gynecology

## 2013-11-15 ENCOUNTER — Ambulatory Visit (INDEPENDENT_AMBULATORY_CARE_PROVIDER_SITE_OTHER): Payer: BC Managed Care – PPO | Admitting: Obstetrics and Gynecology

## 2013-11-15 ENCOUNTER — Encounter: Payer: Self-pay | Admitting: Obstetrics and Gynecology

## 2013-11-15 VITALS — BP 120/90 | Ht 59.0 in | Wt 130.0 lb

## 2013-11-15 DIAGNOSIS — Z01419 Encounter for gynecological examination (general) (routine) without abnormal findings: Secondary | ICD-10-CM | POA: Insufficient documentation

## 2013-11-15 LAB — CBC
HEMATOCRIT: 28.3 % — AB (ref 36.0–46.0)
HEMOGLOBIN: 8.2 g/dL — AB (ref 12.0–15.0)
MCH: 18.9 pg — AB (ref 26.0–34.0)
MCHC: 29 g/dL — AB (ref 30.0–36.0)
MCV: 65.1 fL — AB (ref 78.0–100.0)
Platelets: 291 10*3/uL (ref 150–400)
RBC: 4.35 MIL/uL (ref 3.87–5.11)
RDW: 22 % — AB (ref 11.5–15.5)
WBC: 6.3 10*3/uL (ref 4.0–10.5)

## 2013-11-15 NOTE — Progress Notes (Signed)
This chart was scribed by Leone PayorSonum Patel, Medical Scribe, for Dr. Christin BachJohn Vernon Maish on 11/15/13 at 11:35 AM. This chart was reviewed by Dr. Christin BachJohn Jaelynne [name] for accuracy.  Assessment:  Annual Gyn Exam   Plan:  1. pap smear  NOT done, next pap due 2 years  2. return annually or prn 3    Annual mammogram advised Subjective:  [name] is a 38 y.o. female G2P1100 who presents for annual exam. Patient's last menstrual period was 11/01/2013. The patient has no complaints today.   The following portions of the patient's history were reviewed and updated as appropriate: allergies, current medications, past family history, past medical history, past social history, past surgical history and problem list. Past Medical History  Diagnosis Date  . Hypertension   . HSV (herpes simplex virus) infection     Past Surgical History  Procedure Laterality Date  . Cesarean section      Current outpatient prescriptions:acyclovir (ZOVIRAX) 400 MG tablet, Take 1 tablet (400 mg total) by mouth 5 (five) times daily., Disp: 25 tablet, Rfl: 3;  fluconazole (DIFLUCAN) 150 MG tablet, Take 1 tablet (150 mg total) by mouth once. Take the second tablet 3 days after the first one., Disp: 2 tablet, Rfl: 0 fluconazole (DIFLUCAN) 150 MG tablet, Take 1 tablet (150 mg total) by mouth once. Take the second tablet 3 days after the first one., Disp: 2 tablet, Rfl: 0  Review of Systems Constitutional: negative Gastrointestinal: negative Genitourinary: negative   Objective:  BP 120/90  Ht 4\' 11"  (1.499 m)  Wt 130 lb (58.968 kg)  BMI 26.24 kg/m2  LMP 11/01/2013   BMI: Body mass index is 26.24 kg/(m^2).  General Appearance: Alert, appropriate appearance for age. No acute distress HEENT: Grossly normal Neck / Thyroid:  Cardiovascular: RRR; normal S1, S2, no murmur Lungs: CTA bilaterally Back: No CVAT Breast Exam: No dimpling, nipple retraction or discharge. No masses or nodes., Normal to inspection, Normal breast tissue  bilaterally and No masses or nodes.No dimpling, nipple retraction or discharge. Gastrointestinal: Soft, non-tender, no masses or organomegaly Pelvic Exam: External genitalia: normal general appearance Vaginal: normal mucosa without prolapse or lesions and normal without tenderness, induration or masses Cervix: normal appearance Adnexa: normal bimanual exam Uterus: normal single, nontender Rectovaginal: not indicated Lymphatic Exam: Non-palpable nodes in neck, clavicular, axillary, or inguinal regions Skin: no rash or abnormalities Neurologic: Normal gait and speech, no tremor  Psychiatric: Alert and oriented, appropriate affect.  Urinalysis:Not done  Christin BachJohn Alissa Pharr. MD Pgr [phone] 11:34 AM

## 2013-11-15 NOTE — Patient Instructions (Signed)
Please sign up for my chart so you can review your labs

## 2013-11-16 LAB — BASIC METABOLIC PANEL
BUN: 9 mg/dL (ref 6–23)
CALCIUM: 8.9 mg/dL (ref 8.4–10.5)
CHLORIDE: 104 meq/L (ref 96–112)
CO2: 25 meq/L (ref 19–32)
CREATININE: 0.61 mg/dL (ref 0.50–1.10)
Glucose, Bld: 85 mg/dL (ref 70–99)
Potassium: 4 mEq/L (ref 3.5–5.3)
SODIUM: 136 meq/L (ref 135–145)

## 2013-11-16 LAB — LIPID PANEL
Cholesterol: 139 mg/dL (ref 0–200)
HDL: 58 mg/dL (ref >39)
LDL CALC: 70 mg/dL (ref 0–99)
TRIGLYCERIDES: 53 mg/dL (ref <150)
Total CHOL/HDL Ratio: 2.4 Ratio
VLDL: 11 mg/dL (ref 0–40)

## 2013-12-18 ENCOUNTER — Other Ambulatory Visit: Payer: Self-pay | Admitting: Obstetrics & Gynecology

## 2013-12-20 ENCOUNTER — Other Ambulatory Visit: Payer: Self-pay | Admitting: Obstetrics & Gynecology

## 2014-02-10 ENCOUNTER — Ambulatory Visit: Payer: BC Managed Care – PPO | Admitting: Adult Health

## 2014-02-21 ENCOUNTER — Encounter: Payer: Self-pay | Admitting: Obstetrics and Gynecology

## 2014-04-18 ENCOUNTER — Telehealth: Payer: Self-pay | Admitting: *Deleted

## 2014-04-18 NOTE — Telephone Encounter (Signed)
Pt c/o vaginal itching, requesting Diflucan to be e-scribed.

## 2014-04-20 NOTE — Telephone Encounter (Signed)
Offered appt.  Was just tx'd for BV and did not have yest identified at the time of that exam.

## 2014-05-23 ENCOUNTER — Ambulatory Visit: Payer: Self-pay | Admitting: Adult Health

## 2014-05-30 ENCOUNTER — Encounter: Payer: Self-pay | Admitting: Adult Health

## 2014-05-30 ENCOUNTER — Ambulatory Visit (INDEPENDENT_AMBULATORY_CARE_PROVIDER_SITE_OTHER): Payer: BC Managed Care – PPO | Admitting: Adult Health

## 2014-05-30 VITALS — BP 120/70 | Ht 59.0 in | Wt 134.5 lb

## 2014-05-30 DIAGNOSIS — Z139 Encounter for screening, unspecified: Secondary | ICD-10-CM

## 2014-05-30 DIAGNOSIS — N898 Other specified noninflammatory disorders of vagina: Secondary | ICD-10-CM | POA: Insufficient documentation

## 2014-05-30 DIAGNOSIS — B379 Candidiasis, unspecified: Secondary | ICD-10-CM | POA: Insufficient documentation

## 2014-05-30 HISTORY — DX: Candidiasis, unspecified: B37.9

## 2014-05-30 HISTORY — DX: Other specified noninflammatory disorders of vagina: N89.8

## 2014-05-30 LAB — POCT WET PREP (WET MOUNT)

## 2014-05-30 MED ORDER — FLUCONAZOLE 150 MG PO TABS: ORAL_TABLET | ORAL | Status: DC

## 2014-05-30 NOTE — Patient Instructions (Signed)
Monilial Vaginitis Vaginitis in a soreness, swelling and redness (inflammation) of the vagina and vulva. Monilial vaginitis is not a sexually transmitted infection. CAUSES  Yeast vaginitis is caused by yeast (candida) that is normally found in your vagina. With a yeast infection, the candida has overgrown in number to a point that upsets the chemical balance. SYMPTOMS   White, thick vaginal discharge.  Swelling, itching, redness and irritation of the vagina and possibly the lips of the vagina (vulva).  Burning or painful urination.  Painful intercourse. DIAGNOSIS  Things that may contribute to monilial vaginitis are:  Postmenopausal and virginal states.  Pregnancy.  Infections.  Being tired, sick or stressed, especially if you had monilial vaginitis in the past.  Diabetes. Good control will help lower the chance.  Birth control pills.  Tight fitting garments.  Using bubble bath, feminine sprays, douches or deodorant tampons.  Taking certain medications that kill germs (antibiotics).  Sporadic recurrence can occur if you become ill. TREATMENT  Your caregiver will give you medication.  There are several kinds of anti monilial vaginal creams and suppositories specific for monilial vaginitis. For recurrent yeast infections, use a suppository or cream in the vagina 2 times a week, or as directed.  Anti-monilial or steroid cream for the itching or irritation of the vulva may also be used. Get your caregiver's permission.  Painting the vagina with methylene blue solution may help if the monilial cream does not work.  Eating yogurt may help prevent monilial vaginitis. HOME CARE INSTRUCTIONS   Finish all medication as prescribed.  Do not have sex until treatment is completed or after your caregiver tells you it is okay.  Take warm sitz baths.  Do not douche.  Do not use tampons, especially scented ones.  Wear cotton underwear.  Avoid tight pants and panty  hose.  Tell your sexual partner that you have a yeast infection. They should go to their caregiver if they have symptoms such as mild rash or itching.  Your sexual partner should be treated as well if your infection is difficult to eliminate.  Practice safer sex. Use condoms.  Some vaginal medications cause latex condoms to fail. Vaginal medications that harm condoms are:  Cleocin cream.  Butoconazole (Femstat).  Terconazole (Terazol) vaginal suppository.  Miconazole (Monistat) (may be purchased over the counter). SEEK MEDICAL CARE IF:   You have a temperature by mouth above 102 F (38.9 C).  The infection is getting worse after 2 days of treatment.  The infection is not getting better after 3 days of treatment.  You develop blisters in or around your vagina.  You develop vaginal bleeding, and it is not your menstrual period.  You have pain when you urinate.  You develop intestinal problems.  You have pain with sexual intercourse. Document Released: 01/16/2005 Document Revised: 07/01/2011 Document Reviewed: 09/30/2008 [facility] Patient Information 2015 [place] [place]. This information is not intended to replace advice given to you by your health care provider. Make sure you discuss any questions you have with your health care provider.will call when labs back  Try diflucan

## 2014-05-30 NOTE — Progress Notes (Signed)
Subjective:     Patient ID: [name], female   DOB: [birth year], 39 y.o.   MRN: [id number]  HPI Chales Abrahamsorilynne is a 39 year old black female in complaining of while milky vaginal discharge and itches before period, no new soaps, uses Dove, no new sex partners, does not wear thongs or douche.She says she is craving ice.  Review of Systems  Patient denies any headaches, hearing loss, fatigue, blurred vision, shortness of breath, chest pain, abdominal pain, problems with bowel movements, urination, or intercourse. No joint pain or mood swings.See HPI for positives.  Reviewed past medical,surgical, social and family history. Reviewed medications and allergies.     Objective:   Physical Exam BP 120/70 mmHg  Ht 4\' 11"  (1.499 m)  Wt 134 lb 8 oz (61.009 kg)  BMI 27.15 kg/m2  LMP 05/19/2014 Skin warm and dry.Pelvic: external genitalia is normal in appearance no lesions, vagina: white creamy discharge without odor,urethra has no lesions or masses noted, cervix:smooth and bulbous, uterus: normal size, shape and contour, non tender, no masses felt, adnexa: no masses or tenderness noted. Bladder is non tender and no masses felt. Wet prep: + for yeast Discussed checking labs and she agrees, to make sure blood sugar normal and that she is not anemic.    Assessment:     Vaginal discharge Yeast     Plan:     Rx diflucan 150 mg #2 take 1 now and 1 in 3 days and 1 before period, 6 refills Check CBC,CMP,TSH and lipids Follow up prn Review handout on yeast

## 2014-05-31 ENCOUNTER — Encounter: Payer: Self-pay | Admitting: Adult Health

## 2014-05-31 ENCOUNTER — Telehealth: Payer: Self-pay | Admitting: Adult Health

## 2014-05-31 DIAGNOSIS — D649 Anemia, unspecified: Secondary | ICD-10-CM | POA: Insufficient documentation

## 2014-05-31 DIAGNOSIS — D509 Iron deficiency anemia, unspecified: Secondary | ICD-10-CM

## 2014-05-31 HISTORY — DX: Anemia, unspecified: D64.9

## 2014-05-31 LAB — COMPREHENSIVE METABOLIC PANEL
A/G RATIO: 1.7 (ref 1.1–2.5)
ALK PHOS: 48 IU/L (ref 39–117)
ALT: 15 IU/L (ref 0–32)
AST: 18 IU/L (ref 0–40)
Albumin: 4.3 g/dL (ref 3.5–5.5)
BUN / CREAT RATIO: 13 (ref 8–20)
BUN: 8 mg/dL (ref 6–20)
Bilirubin Total: 0.2 mg/dL (ref 0.0–1.2)
CALCIUM: 9 mg/dL (ref 8.7–10.2)
CO2: 22 mmol/L (ref 18–29)
CREATININE: 0.64 mg/dL (ref 0.57–1.00)
Chloride: 104 mmol/L (ref 97–108)
GFR calc Af Amer: 132 mL/min/{1.73_m2} (ref >59)
GFR, EST NON AFRICAN AMERICAN: 114 mL/min/{1.73_m2} (ref >59)
GLOBULIN, TOTAL: 2.5 g/dL (ref 1.5–4.5)
Glucose: 85 mg/dL (ref 65–99)
POTASSIUM: 3.9 mmol/L (ref 3.5–5.2)
SODIUM: 143 mmol/L (ref 134–144)
Total Protein: 6.8 g/dL (ref 6.0–8.5)

## 2014-05-31 LAB — LIPID PANEL
CHOLESTEROL TOTAL: 149 mg/dL (ref 100–199)
Chol/HDL Ratio: 2.5 ratio units (ref 0.0–4.4)
HDL: 59 mg/dL (ref >39)
LDL CALC: 79 mg/dL (ref 0–99)
TRIGLYCERIDES: 54 mg/dL (ref 0–149)
VLDL Cholesterol Cal: 11 mg/dL (ref 5–40)

## 2014-05-31 LAB — CBC
HEMATOCRIT: 29.5 % — AB (ref 34.0–46.6)
Hemoglobin: 8.6 g/dL — ABNORMAL LOW (ref 11.1–15.9)
MCH: 20.1 pg — ABNORMAL LOW (ref 26.6–33.0)
MCHC: 29.2 g/dL — AB (ref 31.5–35.7)
MCV: 69 fL — ABNORMAL LOW (ref 79–97)
PLATELETS: 331 10*3/uL (ref 150–379)
RBC: 4.28 x10E6/uL (ref 3.77–5.28)
RDW: 20.1 % — AB (ref 12.3–15.4)
WBC: 5.1 10*3/uL (ref 3.4–10.8)

## 2014-05-31 LAB — TSH: TSH: 0.659 u[IU]/mL (ref 0.450–4.500)

## 2014-05-31 MED ORDER — MEGESTROL ACETATE 40 MG PO TABS: 40.0000 mg | ORAL_TABLET | Freq: Every day | ORAL | Status: DC

## 2014-05-31 MED ORDER — FERRAPLUS 90 90-1 MG PO TABS: ORAL_TABLET | ORAL | Status: DC

## 2014-05-31 NOTE — Telephone Encounter (Signed)
Pt aware of labs and that HGB 8.6 with MCV 69, will Rx ferraplus 90 1 bid and megace 40 mg 1 daily to stop period, she says periods are 4-5 days and first 2 days heavy, has had tubal, will return 3/9 at 9:30 am and will recheck CBC then, she is aware that this is why she is craving ice, she is anemic.Discussed with Dr Despina HiddenEure.

## 2014-06-29 ENCOUNTER — Ambulatory Visit: Payer: BC Managed Care – PPO | Admitting: Adult Health

## 2014-06-29 ENCOUNTER — Telehealth: Payer: Self-pay | Admitting: *Deleted

## 2014-06-29 NOTE — Telephone Encounter (Signed)
keep taking megace till after gets CBC next week

## 2014-06-29 NOTE — Telephone Encounter (Signed)
Spoke with pt. Pt is on Megace 1 daily. She has 3 pills left. She is not having any bleeding on med. Pt wonders does she continue med? She had to reschedule appt to Tuesday. Please advise. Thanks!! JSY

## 2014-07-05 ENCOUNTER — Ambulatory Visit (INDEPENDENT_AMBULATORY_CARE_PROVIDER_SITE_OTHER): Payer: BLUE CROSS/BLUE SHIELD | Admitting: Adult Health

## 2014-07-05 ENCOUNTER — Encounter: Payer: Self-pay | Admitting: Adult Health

## 2014-07-05 VITALS — BP 128/80 | HR 64 | Ht 59.0 in | Wt 137.8 lb

## 2014-07-05 DIAGNOSIS — D509 Iron deficiency anemia, unspecified: Secondary | ICD-10-CM | POA: Diagnosis not present

## 2014-07-05 NOTE — Progress Notes (Signed)
Subjective:     Patient ID: [name], female   DOB: 03/19/1976, 39 y.o.   MRN: [id number]  HPI [name] is a 39 year old black female in for follow up of starting megace and iron supplement.She says she has more energy and feels better with no ice cravings now.  Review of Systems  - fatigue, - ice cravings, +feels better with more energy,   Reviewed past medical,surgical, social and family history. Reviewed medications and allergies.     Objective:   Physical Exam BP 128/80 mmHg  Pulse 64  Ht 4\' 11"  (1.499 m)  Wt 137 lb 12 oz (62.483 kg)  BMI 27.81 kg/m2  LMP 06/06/2014  Skin warm and dry.  Lungs: clear to ausculation bilaterally. Cardiovascular: regular rate and rhythm.Discussed ablation and IUD and depo as options to control periods.    Assessment:     Anemia     Plan:    Check CBC Continue iron supplement and megace Review handout on ablation   Review handout on anemia Follow up in 6 weeks for ROS and CBC

## 2014-07-05 NOTE — Patient Instructions (Signed)
Iron Deficiency Anemia Anemia is a condition in which there are less red blood cells or hemoglobin in the blood than normal. Hemoglobin is the part of red blood cells that carries oxygen. Iron deficiency anemia is anemia caused by too little iron. It is the most common type of anemia. It may leave you tired and short of breath. CAUSES   Lack of iron in the diet.  Poor absorption of iron, as seen with intestinal disorders.  Intestinal bleeding.  Heavy periods. SIGNS AND SYMPTOMS  Mild anemia may not be noticeable. Symptoms may include:  Fatigue.  Headache.  Pale skin.  Weakness.  Tiredness.  Shortness of breath.  Dizziness.  Cold hands and feet.  Fast or irregular heartbeat. DIAGNOSIS  Diagnosis requires a thorough evaluation and physical exam by your health care provider. Blood tests are generally used to confirm iron deficiency anemia. Additional tests may be done to find the underlying cause of your anemia. These may include:  Testing for blood in the stool (fecal occult blood test).  A procedure to see inside the colon and rectum (colonoscopy).  A procedure to see inside the esophagus and stomach (endoscopy). TREATMENT  Iron deficiency anemia is treated by correcting the cause of the deficiency. Treatment may involve:  Adding iron-rich foods to your diet.  Taking iron supplements. Pregnant or breastfeeding women need to take extra iron because their normal diet usually does not provide the required amount.  Taking vitamins. Vitamin C improves the absorption of iron. Your health care provider may recommend that you take your iron tablets with a glass of orange juice or vitamin C supplement.  Medicines to make heavy menstrual flow lighter.  Surgery. HOME CARE INSTRUCTIONS   Take iron as directed by your health care provider.  If you cannot tolerate taking iron supplements by mouth, talk to your health care provider about taking them through a vein  (intravenously) or an injection into a muscle.  For the best iron absorption, iron supplements should be taken on an empty stomach. If you cannot tolerate them on an empty stomach, you may need to take them with food.  Do not drink milk or take antacids at the same time as your iron supplements. Milk and antacids may interfere with the absorption of iron.  Iron supplements can cause constipation. Make sure to include fiber in your diet to prevent constipation. A stool softener may also be recommended.  Take vitamins as directed by your health care provider.  Eat a diet rich in iron. Foods high in iron include liver, lean beef, whole-grain bread, eggs, dried fruit, and dark green leafy vegetables. SEEK IMMEDIATE MEDICAL CARE IF:   You faint. If this happens, do not drive. Call your local emergency services (911 in [place] if no other help is available.  You have chest pain.  You feel nauseous or vomit.  You have severe or increased shortness of breath with activity.  You feel weak.  You have a rapid heartbeat.  You have unexplained sweating.  You become light-headed when getting up from a chair or bed. MAKE SURE YOU:   Understand these instructions.  Will watch your condition.  Will get help right away if you are not doing well or get worse. Document Released: 04/05/2000 Document Revised: 04/13/2013 Document Reviewed: 12/14/2012 [facility] Patient Information 2015 [place], [place]. This information is not intended to replace advice given to you by your health care provider. Make sure you discuss any questions you have with your health care provider.  Take iron and megace  Follow up in 6 weeks

## 2014-07-06 ENCOUNTER — Telehealth: Payer: Self-pay | Admitting: Adult Health

## 2014-07-06 LAB — CBC
HCT: 40 % (ref 34.0–46.6)
Hemoglobin: 12.2 g/dL (ref 11.1–15.9)
MCH: 23.4 pg — AB (ref 26.6–33.0)
MCHC: 30.5 g/dL — ABNORMAL LOW (ref 31.5–35.7)
MCV: 77 fL — AB (ref 79–97)
PLATELETS: 204 10*3/uL (ref 150–379)
RBC: 5.21 x10E6/uL (ref 3.77–5.28)
RDW: 26.7 % — AB (ref 12.3–15.4)
WBC: 5.1 10*3/uL (ref 3.4–10.8)

## 2014-07-06 NOTE — Telephone Encounter (Signed)
Left message to call.

## 2014-07-06 NOTE — Telephone Encounter (Signed)
Pt aware HGB 12.2 and HCT 40 which is much better was 8.6/29.5, keep taking meds and follow up as scheduled.

## 2014-07-19 ENCOUNTER — Telehealth: Payer: Self-pay | Admitting: Adult Health

## 2014-07-19 NOTE — Telephone Encounter (Signed)
Left message to try 3 megace daily for 5 days then go back to 1 daily

## 2014-07-19 NOTE — Telephone Encounter (Signed)
Spoke with pt. Pt has been on Megace x 2 months. It has controlled the bleeding up until recently. She started spotting 1 week ago. Pt wants to talk to you about this. Please call [phone] 1st. If she don't answer, pt asks that you try [phone]. Thanks!! JSY

## 2014-07-27 ENCOUNTER — Telehealth: Payer: Self-pay | Admitting: Adult Health

## 2014-07-27 NOTE — Telephone Encounter (Signed)
Spoke with pt. Pt has been spotting. She is on Megace. JAG advised to take 3 tabs x 5 days. She has done that and is still spotting. What do you advise? Thanks!! JSY

## 2014-07-27 NOTE — Telephone Encounter (Signed)
Spoke with pt letting her know she could try Depo or see JAG sooner. Pt states she don't have insurance now and [facility]  I advised she could call them and let them know whats going on and see if they can see her sooner. Pt voiced understanding. JSY

## 2014-07-27 NOTE — Telephone Encounter (Signed)
I spoke with pt. She smokes about half a pack a day. Thanks!! JSY

## 2014-08-16 ENCOUNTER — Ambulatory Visit: Payer: BLUE CROSS/BLUE SHIELD | Admitting: Adult Health

## 2014-08-16 ENCOUNTER — Encounter: Payer: Self-pay | Admitting: Adult Health

## 2014-08-22 ENCOUNTER — Telehealth: Payer: Self-pay | Admitting: Adult Health

## 2014-08-22 NOTE — Telephone Encounter (Signed)
Has no insurance and is bleeding, was seen at health dept and placed on OCs, has appt with them tomorrow, keep that  appt

## 2015-02-06 ENCOUNTER — Encounter (HOSPITAL_COMMUNITY): Payer: Self-pay | Admitting: Emergency Medicine

## 2015-02-06 ENCOUNTER — Emergency Department (HOSPITAL_COMMUNITY)
Admission: EM | Admit: 2015-02-06 | Discharge: 2015-02-06 | Disposition: A | Discharge status: Home / Self Care | Payer: Self-pay | Attending: Emergency Medicine | Admitting: Emergency Medicine

## 2015-02-06 DIAGNOSIS — D649 Anemia, unspecified: Secondary | ICD-10-CM | POA: Insufficient documentation

## 2015-02-06 DIAGNOSIS — I10 Essential (primary) hypertension: Secondary | ICD-10-CM | POA: Insufficient documentation

## 2015-02-06 DIAGNOSIS — Z3202 Encounter for pregnancy test, result negative: Secondary | ICD-10-CM | POA: Insufficient documentation

## 2015-02-06 DIAGNOSIS — N760 Acute vaginitis: Secondary | ICD-10-CM | POA: Insufficient documentation

## 2015-02-06 DIAGNOSIS — Z720 Tobacco use: Secondary | ICD-10-CM | POA: Insufficient documentation

## 2015-02-06 DIAGNOSIS — Z79899 Other long term (current) drug therapy: Secondary | ICD-10-CM | POA: Insufficient documentation

## 2015-02-06 DIAGNOSIS — Z8619 Personal history of other infectious and parasitic diseases: Secondary | ICD-10-CM | POA: Insufficient documentation

## 2015-02-06 DIAGNOSIS — B9689 Other specified bacterial agents as the cause of diseases classified elsewhere: Secondary | ICD-10-CM

## 2015-02-06 LAB — WET PREP, GENITAL
Trich, Wet Prep: NONE SEEN
YEAST WET PREP: NONE SEEN

## 2015-02-06 LAB — URINALYSIS, ROUTINE W REFLEX MICROSCOPIC
Bilirubin Urine: NEGATIVE
Glucose, UA: NEGATIVE mg/dL
Hgb urine dipstick: NEGATIVE
Ketones, ur: NEGATIVE mg/dL
LEUKOCYTES UA: NEGATIVE
Nitrite: NEGATIVE
PROTEIN: NEGATIVE mg/dL
SPECIFIC GRAVITY, URINE: 1.02 (ref 1.005–1.030)
UROBILINOGEN UA: 0.2 mg/dL (ref 0.0–1.0)
pH: 6 (ref 5.0–8.0)

## 2015-02-06 LAB — HEMOGLOBIN AND HEMATOCRIT, BLOOD
HCT: 37.5 % (ref 36.0–46.0)
HEMOGLOBIN: 12.1 g/dL (ref 12.0–15.0)

## 2015-02-06 LAB — POC URINE PREG, ED: PREG TEST UR: NEGATIVE

## 2015-02-06 MED ORDER — METRONIDAZOLE 500 MG PO TABS: 500.0000 mg | ORAL_TABLET | Freq: Two times a day (BID) | ORAL | Status: DC

## 2015-02-06 NOTE — ED Notes (Signed)
Patient reports wanting her Iron level checked while here.

## 2015-02-06 NOTE — Discharge Instructions (Signed)
Bacterial Vaginosis Bacterial vaginosis is a vaginal infection that occurs when the normal balance of bacteria in the vagina is disrupted. It results from an overgrowth of certain bacteria. This is the most common vaginal infection in women of childbearing age. Treatment is important to prevent complications, especially in pregnant women, as it can cause a premature delivery. CAUSES  Bacterial vaginosis is caused by an increase in harmful bacteria that are normally present in smaller amounts in the vagina. Several different kinds of bacteria can cause bacterial vaginosis. However, the reason that the condition develops is not fully understood. RISK FACTORS Certain activities or behaviors can put you at an increased risk of developing bacterial vaginosis, including:  Having a new sex partner or multiple sex partners.  Douching.  Using an intrauterine device (IUD) for contraception. Women do not get bacterial vaginosis from toilet seats, bedding, swimming pools, or contact with objects around them. SIGNS AND SYMPTOMS  Some women with bacterial vaginosis have no signs or symptoms. Common symptoms include:  Grey vaginal discharge.  A fishlike odor with discharge, especially after sexual intercourse.  Itching or burning of the vagina and vulva.  Burning or pain with urination. DIAGNOSIS  Your health care provider will take a medical history and examine the vagina for signs of bacterial vaginosis. A sample of vaginal fluid may be taken. Your health care provider will look at this sample under a microscope to check for bacteria and abnormal cells. A vaginal pH test may also be done.  TREATMENT  Bacterial vaginosis may be treated with antibiotic medicines. These may be given in the form of a pill or a vaginal cream. A second round of antibiotics may be prescribed if the condition comes back after treatment. Because bacterial vaginosis increases your risk for sexually transmitted diseases, getting  treated can help reduce your risk for chlamydia, gonorrhea, HIV, and herpes. HOME CARE INSTRUCTIONS   Only take over-the-counter or prescription medicines as directed by your health care provider.  If antibiotic medicine was prescribed, take it as directed. Make sure you finish it even if you start to feel better.  Tell all sexual partners that you have a vaginal infection. They should see their health care provider and be treated if they have problems, such as a mild rash or itching.  During treatment, it is important that you follow these instructions:  Avoid sexual activity or use condoms correctly.  Do not douche.  Avoid alcohol as directed by your health care provider.  Avoid breastfeeding as directed by your health care provider. SEEK MEDICAL CARE IF:   Your symptoms are not improving after 3 days of treatment.  You have increased discharge or pain.  You have a fever. MAKE SURE YOU:   Understand these instructions.  Will watch your condition.  Will get help right away if you are not doing well or get worse. FOR MORE INFORMATION  Centers for Disease Control and Prevention, Division of STD Prevention: SolutionApps.co.zawww.cdc.gov/std American Sexual Health Association (ASHA): www.ashastd.org    This information is not intended to replace advice given to you by your health care provider. Make sure you discuss any questions you have with your health care provider.   Document Released: 04/08/2005 Document Revised: 04/29/2014 Document Reviewed: 11/18/2012 Elsevier Interactive Patient Education 2016 [employer]    Your hemoglobin today is normal at 12.1.  It was 12.2 when last checked so this is stable.

## 2015-02-06 NOTE — ED Notes (Signed)
Pt c/o of vaginal itching and inflammation. Pt also states she has noticed a change in discharge.

## 2015-02-07 LAB — GC/CHLAMYDIA PROBE AMP (~~LOC~~) NOT AT ARMC
CHLAMYDIA, DNA PROBE: NEGATIVE
NEISSERIA GONORRHEA: NEGATIVE

## 2015-02-09 NOTE — ED Provider Notes (Signed)
CSN: [id number]     Arrival date & time 02/06/15  [id number] History   First MD Initiated Contact with Patient 02/06/15 0902     Chief Complaint  Patient presents with  . Vaginal Itching     (Consider location/radiation/quality/duration/timing/severity/associated sxs/prior Treatment) Patient is a 39 y.o. female presenting with vaginal itching. The history is provided by the patient.  Vaginal Itching This is a new problem. The current episode started yesterday. The problem occurs constantly. The problem has been gradually worsening. Pertinent negatives include no abdominal pain, anorexia, change in bowel habit, chills, fever, nausea, rash, urinary symptoms or weakness. Nothing aggravates the symptoms. She has tried nothing for the symptoms.   Pt who is in a monogamous relationship presenting with one day history of thin, white vaginal dc along with itching.  Denies vaginal pain.  She also reports history of severe iron deficiency anemia associated with heavy periods, taking iron supplementation. Her periods have been better, LMP 9/20, bled 4 days and only heavy on day one.  Requesting anemia check.  Denies weakness, fatigue or lightheadedness.   Past Medical History  Diagnosis Date  . Hypertension   . HSV (herpes simplex virus) infection   . Vaginal discharge 05/30/2014  . Yeast infection 05/30/2014  . Anemia 05/31/2014   Past Surgical History  Procedure Laterality Date  . Cesarean section    . Tubal ligation     Family History  Problem Relation Age of Onset  . Hypertension Mother   . Hypertension Brother   . Sickle cell anemia Brother   . Kidney disease Father   . Alzheimer's disease Maternal Grandmother   . Cancer Maternal Grandfather   . Other Brother     born with fluid on brain  . Asthma Son    Social History  Substance Use Topics  . Smoking status: Current Every Day Smoker -- 0.50 packs/day    Types: Cigarettes  . Smokeless tobacco: Never Used  . Alcohol Use: No   OB  History    Gravida Para Term Preterm AB TAB SAB Ectopic Multiple Living   2 2 1 1            Review of Systems  Constitutional: Negative for fever and chills.  Gastrointestinal: Negative for nausea, abdominal pain, anorexia and change in bowel habit.  Genitourinary: Positive for vaginal discharge. Negative for dysuria, urgency, frequency, hematuria, flank pain and pelvic pain.  Skin: Negative for rash.  Neurological: Negative for weakness.      Allergies  Review of patient's allergies indicates no known allergies.  Home Medications   Prior to Admission medications   Medication Sig Start Date End Date Taking? Authorizing Provider  Ascorbic Acid (VITAMIN C PO) Take 1 tablet by mouth daily.   Yes Historical Provider, MD  IRON PO Take 1 tablet by mouth daily.   Yes Historical Provider, MD  Iron-Folic Acid-B12-C-Docusate (FERRAPLUS 90) 90-1 MG TABS Take 1 bid Patient not taking: Reported on 02/06/2015 05/31/14   Adline PotterJennifer A Griffin, NP  metroNIDAZOLE (FLAGYL) 500 MG tablet Take 1 tablet (500 mg total) by mouth 2 (two) times daily. 02/06/15   Burgess AmorJulie Kayelee Herbig, PA-C   BP 126/78 mmHg  Pulse 60  Temp(Src) 98 F (36.7 C) (Oral)  Resp 16  Ht 4\' 11"  (1.499 m)  Wt 148 lb (67.132 kg)  BMI 29.88 kg/m2  SpO2 100%  LMP 01/10/2015 Physical Exam  Constitutional: She appears well-developed and well-nourished.  HENT:  Head: Normocephalic and atraumatic.  Eyes: Conjunctivae are normal.  Cardiovascular: Normal rate, regular rhythm and normal heart sounds.   Pulmonary/Chest: Effort normal and breath sounds normal.  Abdominal: Soft. Bowel sounds are normal. There is no tenderness.  Genitourinary: Uterus is not tender. Cervix exhibits no motion tenderness. Vaginal discharge found.  Musculoskeletal: Normal range of motion.  Neurological: She is alert.  Skin: Skin is warm and dry.  Psychiatric: She has a normal mood and affect.  Nursing note and vitals reviewed.   ED Course  Procedures (including  critical care time) Labs Review Labs Reviewed  WET PREP, GENITAL - Abnormal; Notable for the following:    Clue Cells Wet Prep HPF POC MANY (*)    WBC, Wet Prep HPF POC FEW (*)    All other components within normal limits  HEMOGLOBIN AND HEMATOCRIT, BLOOD  URINALYSIS, ROUTINE W REFLEX MICROSCOPIC (NOT AT [facility])  POC URINE PREG, ED  GC/CHLAMYDIA PROBE AMP ([place]) NOT AT [facility]    Imaging Review No results found. I have personally reviewed and evaluated these images and lab results as part of my medical decision-making.   EKG Interpretation None      MDM   Final diagnoses:  Bacterial vaginosis    Patients  labs reviewed.   Results were also discussed with patient.   Pt placed on flagyl.  Advised prn f/u with pcp if sx persist.    Burgess Amor, PA-C 02/09/15 1129  Leta Baptist, MD 02/09/15 [phone]

## 2015-06-05 ENCOUNTER — Other Ambulatory Visit: Payer: Managed Care, Other (non HMO) | Admitting: Adult Health

## 2015-06-16 ENCOUNTER — Ambulatory Visit (INDEPENDENT_AMBULATORY_CARE_PROVIDER_SITE_OTHER): Payer: Managed Care, Other (non HMO) | Admitting: Adult Health

## 2015-06-16 ENCOUNTER — Other Ambulatory Visit (HOSPITAL_COMMUNITY)
Admission: RE | Admit: 2015-06-16 | Discharge: 2015-06-16 | Disposition: A | Discharge status: Home / Self Care | Payer: Managed Care, Other (non HMO) | Source: Ambulatory Visit | Attending: Adult Health | Admitting: Adult Health

## 2015-06-16 ENCOUNTER — Encounter: Payer: Self-pay | Admitting: Adult Health

## 2015-06-16 VITALS — BP 138/90 | HR 76 | Ht 60.0 in | Wt 148.0 lb

## 2015-06-16 DIAGNOSIS — K429 Umbilical hernia without obstruction or gangrene: Secondary | ICD-10-CM | POA: Diagnosis not present

## 2015-06-16 DIAGNOSIS — B9689 Other specified bacterial agents as the cause of diseases classified elsewhere: Secondary | ICD-10-CM

## 2015-06-16 DIAGNOSIS — Z113 Encounter for screening for infections with a predominantly sexual mode of transmission: Secondary | ICD-10-CM | POA: Diagnosis present

## 2015-06-16 DIAGNOSIS — Z30430 Encounter for insertion of intrauterine contraceptive device: Secondary | ICD-10-CM | POA: Insufficient documentation

## 2015-06-16 DIAGNOSIS — Z01411 Encounter for gynecological examination (general) (routine) with abnormal findings: Secondary | ICD-10-CM | POA: Diagnosis not present

## 2015-06-16 DIAGNOSIS — A499 Bacterial infection, unspecified: Secondary | ICD-10-CM

## 2015-06-16 DIAGNOSIS — N760 Acute vaginitis: Secondary | ICD-10-CM | POA: Diagnosis not present

## 2015-06-16 DIAGNOSIS — N898 Other specified noninflammatory disorders of vagina: Secondary | ICD-10-CM | POA: Diagnosis not present

## 2015-06-16 DIAGNOSIS — Z01419 Encounter for gynecological examination (general) (routine) without abnormal findings: Secondary | ICD-10-CM | POA: Insufficient documentation

## 2015-06-16 DIAGNOSIS — Z1151 Encounter for screening for human papillomavirus (HPV): Secondary | ICD-10-CM | POA: Diagnosis not present

## 2015-06-16 DIAGNOSIS — Z716 Tobacco abuse counseling: Secondary | ICD-10-CM | POA: Diagnosis not present

## 2015-06-16 DIAGNOSIS — F17200 Nicotine dependence, unspecified, uncomplicated: Secondary | ICD-10-CM

## 2015-06-16 DIAGNOSIS — Z720 Tobacco use: Secondary | ICD-10-CM | POA: Diagnosis not present

## 2015-06-16 DIAGNOSIS — F17210 Nicotine dependence, cigarettes, uncomplicated: Secondary | ICD-10-CM

## 2015-06-16 HISTORY — DX: Tobacco abuse counseling: Z71.6

## 2015-06-16 HISTORY — DX: Umbilical hernia without obstruction or gangrene: K42.9

## 2015-06-16 HISTORY — DX: Other specified bacterial agents as the cause of diseases classified elsewhere: N76.0

## 2015-06-16 HISTORY — DX: Nicotine dependence, unspecified, uncomplicated: F17.200

## 2015-06-16 HISTORY — DX: Other specified bacterial agents as the cause of diseases classified elsewhere: B96.89

## 2015-06-16 LAB — POCT WET PREP (WET MOUNT)
Clue Cells Wet Prep Whiff POC: NEGATIVE
WBC WET PREP: POSITIVE

## 2015-06-16 MED ORDER — FLUCONAZOLE 150 MG PO TABS: 150.0000 mg | ORAL_TABLET | Freq: Once | ORAL | Status: DC

## 2015-06-16 MED ORDER — METRONIDAZOLE 500 MG PO TABS: 500.0000 mg | ORAL_TABLET | Freq: Two times a day (BID) | ORAL | Status: DC

## 2015-06-16 MED ORDER — BUPROPION HCL ER (SR) 150 MG PO TB12: 150.0000 mg | ORAL_TABLET | Freq: Every day | ORAL | Status: DC

## 2015-06-16 NOTE — Progress Notes (Signed)
Patient ID: [name], female   DOB: 08/17/1975, 40 y.o.   MRN: [id number] History of Present Illness: [name] is a 40 year old black female in for well woman gyn exam and pap, and she complains of a vaginal discharge and itching and she wants to quit smoking.She got her flu shot in October at work. PCP is Health dept.  Current Medications, Allergies, Past Medical History, Past Surgical History, Family History and Social History were reviewed in [name] record.     Review of Systems: Patient denies any headaches, hearing loss, fatigue, blurred vision, shortness of breath, chest pain, abdominal pain, problems with bowel movements, urination, or intercourse. No joint pain or mood swings.See HPI for positives.    Physical Exam:  BP 138/90 mmHg  Pulse 76  Ht 5' (1.524 m)  Wt 148 lb (67.132 kg)  BMI 28.90 kg/m2  LMP 05/24/2015 (Approximate) General:  Well developed, well nourished, no acute distress Skin:  Warm and dry Neck:  Midline trachea, normal thyroid, good ROM, no lymphadenopathy Lungs; Clear to auscultation bilaterally Breast:  No dominant palpable mass, retraction, or nipple discharge Cardiovascular: Regular rate and rhythm Abdomen:  Soft, non tender, no hepatosplenomegaly, has small reducible umbilical hernia  Pelvic:  External genitalia is normal in appearance, no lesions.  The vagina has copious white discharge with no odor, side walls red. Urethra has no lesions or masses. The cervix is bulbous. Pap with HPV and GC/CHL performed. Uterus is felt to be normal size, shape, and contour.  No adnexal masses or tenderness noted.Bladder is non tender, no masses felt. Wet Prep is +WBC and + bacteria with few clues. Extremities/musculoskeletal:  No swelling or varicosities noted, no clubbing or cyanosis Psych:  No mood changes, alert and cooperative,seems happy Discussed smoking cessation and will Rx Wellbutrin.Set quit date, try increasing water and  walking. If has pain at navel, go to ER.  Impression: Well woman gyn exam and pap Vaginal discharge BV Smoker Umbilical hernia Smoking cessation counseling    Plan: Rx flagyl 500 mg 1 bid x 7 days, no alcohol, review handout on BV   Rx Wellbutrin 150 mg SR #30 take 1 daily with 2 refills Check CBC,CMP,TSH and lipids, vitamin D Physical in 1 year, pap in 3 if normal Mammogram at 41  Review handout on smoking cessation and Wellbutrin

## 2015-06-16 NOTE — Patient Instructions (Signed)
Steps to Quit Smoking  Smoking tobacco can be harmful to your health and can affect almost every organ in your body. Smoking puts you, and those around you, at risk for developing many serious chronic diseases. Quitting smoking is difficult, but it is one of the best things that you can do for your health. It is never too late to quit. WHAT ARE THE BENEFITS OF QUITTING SMOKING? When you quit smoking, you lower your risk of developing serious diseases and conditions, such as:  Lung cancer or lung disease, such as COPD.  Heart disease.  Stroke.  Heart attack.  Infertility.  Osteoporosis and bone fractures. Additionally, symptoms such as coughing, wheezing, and shortness of breath may get better when you quit. You may also find that you get sick less often because your body is stronger at fighting off colds and infections. If you are pregnant, quitting smoking can help to reduce your chances of having a baby of low birth weight. HOW DO I GET READY TO QUIT? When you decide to quit smoking, create a plan to make sure that you are successful. Before you quit:  Pick a date to quit. Set a date within the next two weeks to give you time to prepare.  Write down the reasons why you are quitting. Keep this list in places where you will see it often, such as on your bathroom mirror or in your car or wallet.  Identify the people, places, things, and activities that make you want to smoke (triggers) and avoid them. Make sure to take these actions:  Throw away all cigarettes at home, at work, and in your car.  Throw away smoking accessories, such as ashtrays and lighters.  Clean your car and make sure to empty the ashtray.  Clean your home, including curtains and carpets.  Tell your family, friends, and coworkers that you are quitting. Support from your loved ones can make quitting easier.  Talk with your health care provider about your options for quitting smoking.  Find out what treatment  options are covered by your health insurance. WHAT STRATEGIES CAN I USE TO QUIT SMOKING?  Talk with your healthcare provider about different strategies to quit smoking. Some strategies include:  Quitting smoking altogether instead of gradually lessening how much you smoke over a period of time. Research shows that quitting "cold turkey" is more successful than gradually quitting.  Attending in-person counseling to help you build problem-solving skills. You are more likely to have success in quitting if you attend several counseling sessions. Even short sessions of 10 minutes can be effective.  Finding resources and support systems that can help you to quit smoking and remain smoke-free after you quit. These resources are most helpful when you use them often. They can include:  Online chats with a counselor.  Telephone quitlines.  Printed self-help materials.  Support groups or group counseling.  Text messaging programs.  Mobile phone applications.  Taking medicines to help you quit smoking. (If you are pregnant or breastfeeding, talk with your health care provider first.) Some medicines contain nicotine and some do not. Both types of medicines help with cravings, but the medicines that include nicotine help to relieve withdrawal symptoms. Your health care provider may recommend:  Nicotine patches, gum, or lozenges.  Nicotine inhalers or sprays.  Non-nicotine medicine that is taken by mouth. Talk with your health care provider about combining strategies, such as taking medicines while you are also receiving in-person counseling. Using these two strategies together makes   you more likely to succeed in quitting than if you used either strategy on its own. If you are pregnant or breastfeeding, talk with your health care provider about finding counseling or other support strategies to quit smoking. Do not take medicine to help you quit smoking unless told to do so by your health care  provider. WHAT THINGS CAN I DO TO MAKE IT EASIER TO QUIT? Quitting smoking might feel overwhelming at first, but there is a lot that you can do to make it easier. Take these important actions:  Reach out to your family and friends and ask that they support and encourage you during this time. Call telephone quitlines, reach out to support groups, or work with a counselor for support.  Ask people who smoke to avoid smoking around you.  Avoid places that trigger you to smoke, such as bars, parties, or smoke-break areas at work.  Spend time around people who do not smoke.  Lessen stress in your life, because stress can be a smoking trigger for some people. To lessen stress, try:  Exercising regularly.  Deep-breathing exercises.  Yoga.  Meditating.  Performing a body scan. This involves closing your eyes, scanning your body from head to toe, and noticing which parts of your body are particularly tense. Purposefully relax the muscles in those areas.  Download or purchase mobile phone or tablet apps (applications) that can help you stick to your quit plan by providing reminders, tips, and encouragement. There are many free apps, such as QuitGuide from the [employer] Office manager for Disease Control and Prevention). You can find other support for quitting smoking (smoking cessation) through smokefree.gov and other websites. HOW WILL I FEEL WHEN I QUIT SMOKING? Within the first 24 hours of quitting smoking, you may start to feel some withdrawal symptoms. These symptoms are usually most noticeable 2-3 days after quitting, but they usually do not last beyond 2-3 weeks. Changes or symptoms that you might experience include:  Mood swings.  Restlessness, anxiety, or irritation.  Difficulty concentrating.  Dizziness.  Strong cravings for sugary foods in addition to nicotine.  Mild weight gain.  Constipation.  Nausea.  Coughing or a sore throat.  Changes in how your medicines work in your  body.  A depressed mood.  Difficulty sleeping (insomnia). After the first 2-3 weeks of quitting, you may start to notice more positive results, such as:  Improved sense of smell and taste.  Decreased coughing and sore throat.  Slower heart rate.  Lower blood pressure.  Clearer skin.  The ability to breathe more easily.  Fewer sick days. Quitting smoking is very challenging for most people. Do not get discouraged if you are not successful the first time. Some people need to make many attempts to quit before they achieve long-term success. Do your best to stick to your quit plan, and talk with your health care provider if you have any questions or concerns.   This information is not intended to replace advice given to you by your health care provider. Make sure you discuss any questions you have with your health care provider.   Document Released: 04/02/2001 Document Revised: 08/23/2014 Document Reviewed: 08/23/2014 Elsevier Interactive Patient Education 2016 [employer]. Bupropion sustained-release tablets (smoking cessation) What is this medicine? BUPROPION (byoo PROE pee on) is used to help people quit smoking. This medicine may be used for other purposes; ask your health care provider or pharmacist if you have questions. What should I tell my health care provider before I take  this medicine? They need to know if you have any of these conditions: -an eating disorder, such as anorexia or bulimia -bipolar disorder or psychosis -diabetes or high blood sugar, treated with medication -glaucoma -head injury or brain tumor -heart disease, previous heart attack, or irregular heart beat -high blood pressure -kidney or liver disease -seizures -suicidal thoughts or a previous suicide attempt -Tourette's syndrome -weight loss -an unusual or allergic reaction to bupropion, other medicines, foods, dyes, or preservatives -breast-feeding -pregnant or trying to become pregnant How  should I use this medicine? Take this medicine by mouth with a glass of water. Follow the directions on the prescription label. You can take it with or without food. If it upsets your stomach, take it with food. Do not cut, crush or chew this medicine. Take your medicine at regular intervals. If you take this medicine more than once a day, take your second dose at least 8 hours after you take your first dose. To limit difficulty in sleeping, avoid taking this medicine at bedtime. Do not take your medicine more often than directed. Do not stop taking this medicine suddenly except upon the advice of your doctor. Stopping this medicine too quickly may cause serious side effects. A special MedGuide will be given to you by the pharmacist with each prescription and refill. Be sure to read this information carefully each time. Talk to your pediatrician regarding the use of this medicine in children. Special care may be needed. Overdosage: If you think you have taken too much of this medicine contact a poison control center or emergency room at once. NOTE: This medicine is only for you. Do not share this medicine with others. What if I miss a dose? If you miss a dose, skip the missed dose and take your next tablet at the regular time. There should be at least 8 hours between doses. Do not take double or extra doses. What may interact with this medicine? Do not take this medicine with any of the following medications: -linezolid -MAOIs like Azilect, Carbex, Eldepryl, Marplan, Nardil, and Parnate -methylene blue (injected into a vein) -other medicines that contain bupropion like Wellbutrin This medicine may also interact with the following medications: -alcohol -certain medicines for anxiety or sleep -certain medicines for blood pressure like metoprolol, propranolol -certain medicines for depression or psychotic disturbances -certain medicines for HIV or AIDS like efavirenz, lopinavir, nelfinavir,  ritonavir -certain medicines for irregular heart beat like propafenone, flecainide -certain medicines for Parkinson's disease like amantadine, levodopa -certain medicines for seizures like carbamazepine, phenytoin, phenobarbital -cimetidine -clopidogrel -cyclophosphamide -furazolidone -isoniazid -nicotine -orphenadrine -procarbazine -steroid medicines like prednisone or cortisone -stimulant medicines for attention disorders, weight loss, or to stay awake -tamoxifen -theophylline -thiotepa -ticlopidine -tramadol -warfarin This list may not describe all possible interactions. Give your health care provider a list of all the medicines, herbs, non-prescription drugs, or dietary supplements you use. Also tell them if you smoke, drink alcohol, or use illegal drugs. Some items may interact with your medicine. What should I watch for while using this medicine? Visit your doctor or health care professional for regular checks on your progress. This medicine should be used together with a patient support program. It is important to participate in a behavioral program, counseling, or other support program that is recommended by your health care professional. Patients and their families should watch out for new or worsening thoughts of suicide or depression. Also watch out for sudden changes in feelings such as feeling anxious, agitated, panicky, irritable, hostile,  aggressive, impulsive, severely restless, overly excited and hyperactive, or not being able to sleep. If this happens, especially at the beginning of treatment or after a change in dose, call your health care professional. Avoid alcoholic drinks while taking this medicine. Drinking excessive alcoholic beverages, using sleeping or anxiety medicines, or quickly stopping the use of these agents while taking this medicine may increase your risk for a seizure. Do not drive or use heavy machinery until you know how this medicine affects you. This  medicine can impair your ability to perform these tasks. Do not take this medicine close to bedtime. It may prevent you from sleeping. Your mouth may get dry. Chewing sugarless gum or sucking hard candy, and drinking plenty of water may help. Contact your doctor if the problem does not go away or is severe. Do not use nicotine patches or chewing gum without the advice of your doctor or health care professional while taking this medicine. You may need to have your blood pressure taken regularly if your doctor recommends that you use both nicotine and this medicine together. What side effects may I notice from receiving this medicine? Side effects that you should report to your doctor or health care professional as soon as possible: -allergic reactions like skin rash, itching or hives, swelling of the face, lips, or tongue -breathing problems -changes in vision -confusion -fast or irregular heartbeat -hallucinations -increased blood pressure -redness, blistering, peeling or loosening of the skin, including inside the mouth -seizures -suicidal thoughts or other mood changes -unusually weak or tired -vomiting Side effects that usually do not require medical attention (report to your doctor or health care professional if they continue or are bothersome): -change in sex drive or performance -constipation -headache -loss of appetite -nausea -tremors -weight loss This list may not describe all possible side effects. Call your doctor for medical advice about side effects. You may report side effects to FDA at 1-800-FDA-1088. Where should I keep my medicine? Keep out of the reach of children. Store at room temperature between 20 and 25 degrees C (68 and 77 degrees F). Protect from light. Keep container tightly closed. Throw away any unused medicine after the expiration date. NOTE: This sheet is a summary. It may not cover all possible information. If you have questions about this medicine, talk  to your doctor, pharmacist, or health care provider.    2016, Elsevier/Gold Standard. (2012-12-04 10:55:10) Bacterial Vaginosis Bacterial vaginosis is a vaginal infection that occurs when the normal balance of bacteria in the vagina is disrupted. It results from an overgrowth of certain bacteria. This is the most common vaginal infection in women of childbearing age. Treatment is important to prevent complications, especially in pregnant women, as it can cause a premature delivery. CAUSES  Bacterial vaginosis is caused by an increase in harmful bacteria that are normally present in smaller amounts in the vagina. Several different kinds of bacteria can cause bacterial vaginosis. However, the reason that the condition develops is not fully understood. RISK FACTORS Certain activities or behaviors can put you at an increased risk of developing bacterial vaginosis, including:  Having a new sex partner or multiple sex partners.  Douching.  Using an intrauterine device (IUD) for contraception. Women do not get bacterial vaginosis from toilet seats, bedding, swimming pools, or contact with objects around them. SIGNS AND SYMPTOMS  Some women with bacterial vaginosis have no signs or symptoms. Common symptoms include:  Grey vaginal discharge.  A fishlike odor with discharge, especially after sexual intercourse.  Itching or burning of the vagina and vulva.  Burning or pain with urination. DIAGNOSIS  Your health care provider will take a medical history and examine the vagina for signs of bacterial vaginosis. A sample of vaginal fluid may be taken. Your health care provider will look at this sample under a microscope to check for bacteria and abnormal cells. A vaginal pH test may also be done.  TREATMENT  Bacterial vaginosis may be treated with antibiotic medicines. These may be given in the form of a pill or a vaginal cream. A second round of antibiotics may be prescribed if the condition comes  back after treatment. Because bacterial vaginosis increases your risk for sexually transmitted diseases, getting treated can help reduce your risk for chlamydia, gonorrhea, HIV, and herpes. HOME CARE INSTRUCTIONS   Only take over-the-counter or prescription medicines as directed by your health care provider.  If antibiotic medicine was prescribed, take it as directed. Make sure you finish it even if you start to feel better.  Tell all sexual partners that you have a vaginal infection. They should see their health care provider and be treated if they have problems, such as a mild rash or itching.  During treatment, it is important that you follow these instructions:  Avoid sexual activity or use condoms correctly.  Do not douche.  Avoid alcohol as directed by your health care provider.  Avoid breastfeeding as directed by your health care provider. SEEK MEDICAL CARE IF:   Your symptoms are not improving after 3 days of treatment.  You have increased discharge or pain.  You have a fever. MAKE SURE YOU:   Understand these instructions.  Will watch your condition.  Will get help right away if you are not doing well or get worse. FOR MORE INFORMATION  Centers for Disease Control and Prevention, Division of STD Prevention: SolutionApps.co.za American Sexual Health Association (ASHA): www.ashastd.org    This information is not intended to replace advice given to you by your health care provider. Make sure you discuss any questions you have with your health care provider.   Document Released: 04/08/2005 Document Revised: 04/29/2014 Document Reviewed: 11/18/2012 Elsevier Interactive Patient Education 2016 [employer]. No alochol

## 2015-06-17 LAB — COMPREHENSIVE METABOLIC PANEL
A/G RATIO: 1.5 (ref 1.1–2.5)
ALBUMIN: 4.4 g/dL (ref 3.5–5.5)
ALT: 11 IU/L (ref 0–32)
AST: 16 IU/L (ref 0–40)
Alkaline Phosphatase: 45 IU/L (ref 39–117)
BUN / CREAT RATIO: 12 (ref 8–20)
BUN: 8 mg/dL (ref 6–20)
Bilirubin Total: 0.5 mg/dL (ref 0.0–1.2)
CALCIUM: 9.3 mg/dL (ref 8.7–10.2)
CO2: 23 mmol/L (ref 18–29)
CREATININE: 0.66 mg/dL (ref 0.57–1.00)
Chloride: 104 mmol/L (ref 96–106)
GFR, EST AFRICAN AMERICAN: 130 mL/min/{1.73_m2} (ref >59)
GFR, EST NON AFRICAN AMERICAN: 112 mL/min/{1.73_m2} (ref >59)
GLOBULIN, TOTAL: 2.9 g/dL (ref 1.5–4.5)
Glucose: 88 mg/dL (ref 65–99)
POTASSIUM: 3.7 mmol/L (ref 3.5–5.2)
SODIUM: 141 mmol/L (ref 134–144)
TOTAL PROTEIN: 7.3 g/dL (ref 6.0–8.5)

## 2015-06-17 LAB — CBC
HEMATOCRIT: 35.8 % (ref 34.0–46.6)
HEMOGLOBIN: 11.4 g/dL (ref 11.1–15.9)
MCH: 25.1 pg — ABNORMAL LOW (ref 26.6–33.0)
MCHC: 31.8 g/dL (ref 31.5–35.7)
MCV: 79 fL (ref 79–97)
Platelets: 252 10*3/uL (ref 150–379)
RBC: 4.55 x10E6/uL (ref 3.77–5.28)
RDW: 15.6 % — AB (ref 12.3–15.4)
WBC: 6.2 10*3/uL (ref 3.4–10.8)

## 2015-06-17 LAB — TSH: TSH: 0.748 u[IU]/mL (ref 0.450–4.500)

## 2015-06-17 LAB — LIPID PANEL
CHOL/HDL RATIO: 3.2 ratio (ref 0.0–4.4)
Cholesterol, Total: 164 mg/dL (ref 100–199)
HDL: 51 mg/dL (ref >39)
LDL CALC: 100 mg/dL — AB (ref 0–99)
Triglycerides: 66 mg/dL (ref 0–149)
VLDL CHOLESTEROL CAL: 13 mg/dL (ref 5–40)

## 2015-06-17 LAB — VITAMIN D 25 HYDROXY (VIT D DEFICIENCY, FRACTURES): Vit D, 25-Hydroxy: 9.7 ng/mL — ABNORMAL LOW (ref 30.0–100.0)

## 2015-06-19 ENCOUNTER — Telehealth: Payer: Self-pay | Admitting: Adult Health

## 2015-06-19 ENCOUNTER — Encounter: Payer: Self-pay | Admitting: Adult Health

## 2015-06-19 DIAGNOSIS — E559 Vitamin D deficiency, unspecified: Secondary | ICD-10-CM | POA: Insufficient documentation

## 2015-06-19 HISTORY — DX: Vitamin D deficiency, unspecified: E55.9

## 2015-06-19 LAB — CYTOLOGY - PAP

## 2015-06-19 MED ORDER — CHOLECALCIFEROL 125 MCG (5000 UT) PO CAPS: 5000.0000 [IU] | ORAL_CAPSULE | Freq: Every day | ORAL | Status: DC

## 2015-06-19 NOTE — Telephone Encounter (Signed)
Pt aware of labs and need for vitamin D 5000 IU per day

## 2015-06-19 NOTE — Telephone Encounter (Signed)
Pt aware pap normal and +yeast, has a diflucan will take it

## 2016-05-06 ENCOUNTER — Encounter (HOSPITAL_COMMUNITY): Payer: Self-pay | Admitting: *Deleted

## 2016-05-06 ENCOUNTER — Emergency Department (HOSPITAL_COMMUNITY)
Admission: EM | Admit: 2016-05-06 | Discharge: 2016-05-06 | Disposition: A | Discharge status: Home / Self Care | Payer: Managed Care, Other (non HMO) | Attending: Emergency Medicine | Admitting: Emergency Medicine

## 2016-05-06 DIAGNOSIS — Z79899 Other long term (current) drug therapy: Secondary | ICD-10-CM | POA: Insufficient documentation

## 2016-05-06 DIAGNOSIS — F17210 Nicotine dependence, cigarettes, uncomplicated: Secondary | ICD-10-CM | POA: Insufficient documentation

## 2016-05-06 DIAGNOSIS — I10 Essential (primary) hypertension: Secondary | ICD-10-CM | POA: Insufficient documentation

## 2016-05-06 DIAGNOSIS — N390 Urinary tract infection, site not specified: Secondary | ICD-10-CM

## 2016-05-06 LAB — WET PREP, GENITAL
Clue Cells Wet Prep HPF POC: NONE SEEN
SPERM: NONE SEEN
TRICH WET PREP: NONE SEEN
Yeast Wet Prep HPF POC: NONE SEEN

## 2016-05-06 LAB — POC URINE PREG, ED: PREG TEST UR: NEGATIVE

## 2016-05-06 LAB — URINALYSIS, ROUTINE W REFLEX MICROSCOPIC
BILIRUBIN URINE: NEGATIVE
GLUCOSE, UA: NEGATIVE mg/dL
Ketones, ur: NEGATIVE mg/dL
NITRITE: NEGATIVE
PH: 6 (ref 5.0–8.0)
Protein, ur: NEGATIVE mg/dL
SPECIFIC GRAVITY, URINE: 1.016 (ref 1.005–1.030)

## 2016-05-06 MED ORDER — CEPHALEXIN 500 MG PO CAPS: 500.0000 mg | ORAL_CAPSULE | Freq: Four times a day (QID) | ORAL | 0 refills | Status: DC

## 2016-05-06 MED ORDER — CEPHALEXIN 500 MG PO CAPS
500.0000 mg | ORAL_CAPSULE | Freq: Once | ORAL | Status: AC
  Administered 2016-05-06: 500 mg via ORAL
  Filled 2016-05-06: qty 1

## 2016-05-06 NOTE — ED Triage Notes (Signed)
Pt has had vaginal itching and discharge starting last Thursday. Denies any urinary symptoms. NAD noted

## 2016-05-06 NOTE — Discharge Instructions (Signed)
Drink plenty of water and cranberry juice.  Take the antibiotic as directed until its finished.  Follow-up with one of the clinics listed or return here for any worsening symptoms

## 2016-05-06 NOTE — ED Provider Notes (Signed)
[facility]EM[facility]PT Provider Note   CSN: [id number] Arrival date & time: 05/06/16  4098     History   Chief Complaint Chief Complaint  Patient presents with  . Vaginal Itching    HPI [name] is a 41 y.o. female.  HPI   [name] is a 41 y.o. female who presents to the Emergency Department complaining of vaginal itching and discharge for 4 days.  She also reports having urinary frequency and dark colored urine for several days.  She denies vaginal bleeding, new sexual partners, genital lesions, abdominal pain, back pain, fever or vomiting. Nothing makes the pain better or worse.     Past Medical History:  Diagnosis Date  . Anemia 05/31/2014  . BV (bacterial vaginosis) 06/16/2015  . Encounter for smoking cessation counseling 06/16/2015  . HSV (herpes simplex virus) infection   . Hypertension   . Smoker 06/16/2015  . Umbilical hernia 06/16/2015  . Vaginal discharge 05/30/2014  . Vitamin D deficiency 06/19/2015  . Yeast infection 05/30/2014    Patient Active Problem List   Diagnosis Date Noted  . Vitamin D deficiency 06/19/2015  . BV (bacterial vaginosis) 06/16/2015  . Smoker 06/16/2015  . Umbilical hernia 06/16/2015  . Encounter for smoking cessation counseling 06/16/2015  . Anemia 05/31/2014  . Vaginal discharge 05/30/2014  . Yeast infection 05/30/2014  . Routine gynecological examination 11/15/2013  . 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 CESAREAN SECTION    . TUBAL LIGATION      OB History    Gravida Para Term Preterm AB Living   3 2 1 1   3    SAB TAB Ectopic Multiple Live Births                   Home Medications    Prior to Admission medications   Medication Sig Start Date End Date Taking? Authorizing Provider  Ascorbic Acid (VITAMIN C PO) Take 1 tablet by mouth daily.    Historical Provider, MD  buPROPion (WELLBUTRIN SR) 150 MG 12 hr tablet Take 1 tablet (150 mg total) by mouth  daily. 06/16/15   [name], NP  Cholecalciferol 5000 units capsule Take 1 capsule (5,000 Units total) by mouth daily. 06/19/15   [name], NP  fluconazole (DIFLUCAN) 150 MG tablet Take 1 tablet (150 mg total) by mouth once. 06/16/15   [name], NP  IRON PO Take 1 tablet by mouth daily.    Historical Provider, MD  metroNIDAZOLE (FLAGYL) 500 MG tablet Take 1 tablet (500 mg total) by mouth 2 (two) times daily. 06/16/15   [name], NP    Family History Family History  Problem Relation Age of Onset  . Hypertension Mother   . Hypertension Brother   . Sickle cell anemia Brother   . Kidney disease Father   . Alzheimer's disease Maternal Grandmother   . Cancer Maternal Grandfather   . Other Brother     born with fluid on brain  . Asthma Son     Social History Social History  Substance Use Topics  . Smoking status: Current Every Day Smoker    Packs/day: 0.50    Years: 18.00    Types: Cigarettes  . Smokeless tobacco: Never Used  . Alcohol use No     Allergies   Patient has no known allergies.   Review of Systems Review of Systems  Constitutional: Negative for appetite change, chills  and fever.  Respiratory: Negative for cough and shortness of breath.   Cardiovascular: Negative for chest pain.  Gastrointestinal: Negative for abdominal pain, nausea and vomiting.  Genitourinary: Positive for dysuria, frequency and vaginal discharge (vaginal itching). Negative for decreased urine volume, difficulty urinating, flank pain, hematuria and vaginal bleeding.  Musculoskeletal: Negative for back pain.  Neurological: Negative for headaches.     Physical Exam Updated Vital Signs BP 131/78 (BP Location: Left Arm)   Pulse 71   Temp 98.3 F (36.8 C) (Oral)   Resp 15   Ht 4\' 11"  (1.499 m)   Wt 66.2 kg   LMP 04/13/2016   SpO2 100%   BMI 29.49 kg/m   Physical Exam  Constitutional: She is oriented to person, place, and time. She appears  well-developed and well-nourished. No distress.  HENT:  Head: Atraumatic.  Mouth/Throat: Oropharynx is clear and moist.  Cardiovascular: Normal rate, regular rhythm and normal heart sounds.   Pulmonary/Chest: Effort normal. No respiratory distress.  Abdominal: Soft. She exhibits no distension and no mass. There is no tenderness. There is no guarding.  Genitourinary: Uterus normal. There is no rash on the right labia. There is no rash on the left labia. Cervix exhibits no motion tenderness. Right adnexum displays no mass, no tenderness and no fullness. Left adnexum displays no mass, no tenderness and no fullness. No bleeding in the vagina. No foreign body in the vagina. Vaginal discharge found.  Genitourinary Comments: Exam chaperoned by nursing.  Mild, white vaginal d/c.  No adnexal masses or tenderness. No CMT.   Nml appearing external gentalia  Musculoskeletal: Normal range of motion.  Neurological: She is alert and oriented to person, place, and time.  Skin: Skin is warm. No rash noted.  Psychiatric: She has a normal mood and affect.  Nursing note and vitals reviewed.    ED Treatments / Results  Labs (all labs ordered are listed, but only abnormal results are displayed) Labs Reviewed  WET PREP, GENITAL  URINALYSIS, ROUTINE W REFLEX MICROSCOPIC  POC URINE PREG, ED  GC/CHLAMYDIA PROBE AMP ([facility]    EKG  EKG Interpretation None       Radiology No results found.  Procedures Procedures (including critical care time)  Medications Ordered in ED Medications - No data to display   Initial Impression / Assessment and Plan / ED Course  I have reviewed the triage vital signs and the nursing notes.  Pertinent labs & imaging results that were available during my care of the patient were reviewed by me and considered in my medical decision making (see chart for details).  Clinical Course     Pt well appearing.  Vitals stable.  Abd remains soft, NT.  No CVA  tenderness, fever or vomiting to suggest pyelonephritis.  Dysuria sx's likely related to UTI.  Cultures pending.  I will start keflex, suggested to give IM rocephin and zithromax, but pt prefers to wait for culture results.  Agrees to keflex and ER return if not improving.  Appears stable for d/c  Final Clinical Impressions(s) / ED Diagnoses   Final diagnoses:  Urinary tract infection in female    New Prescriptions New Prescriptions   No medications on file     Pauline Ausammy Shelbe Haglund, PA-C 05/06/16 1057    Lavera Guiseana Duo Liu, MD 05/06/16 2009

## 2016-05-07 LAB — GC/CHLAMYDIA PROBE AMP (~~LOC~~) NOT AT ARMC
Chlamydia: NEGATIVE
NEISSERIA GONORRHEA: NEGATIVE

## 2016-05-08 LAB — URINE CULTURE

## 2016-06-17 ENCOUNTER — Other Ambulatory Visit: Payer: Managed Care, Other (non HMO) | Admitting: Adult Health

## 2016-08-12 ENCOUNTER — Encounter: Payer: Self-pay | Admitting: Adult Health

## 2016-08-12 ENCOUNTER — Ambulatory Visit (INDEPENDENT_AMBULATORY_CARE_PROVIDER_SITE_OTHER): Payer: Commercial Managed Care - PPO | Admitting: Adult Health

## 2016-08-12 VITALS — BP 122/80 | HR 66 | Ht 59.75 in | Wt 148.5 lb

## 2016-08-12 DIAGNOSIS — F17200 Nicotine dependence, unspecified, uncomplicated: Secondary | ICD-10-CM

## 2016-08-12 DIAGNOSIS — Z01419 Encounter for gynecological examination (general) (routine) without abnormal findings: Secondary | ICD-10-CM

## 2016-08-12 DIAGNOSIS — K429 Umbilical hernia without obstruction or gangrene: Secondary | ICD-10-CM

## 2016-08-12 DIAGNOSIS — Z13220 Encounter for screening for lipoid disorders: Secondary | ICD-10-CM

## 2016-08-12 DIAGNOSIS — R300 Dysuria: Secondary | ICD-10-CM | POA: Diagnosis not present

## 2016-08-12 DIAGNOSIS — E559 Vitamin D deficiency, unspecified: Secondary | ICD-10-CM

## 2016-08-12 LAB — POCT URINALYSIS DIPSTICK
Glucose, UA: NEGATIVE
Ketones, UA: NEGATIVE
Leukocytes, UA: NEGATIVE
Nitrite, UA: NEGATIVE
PROTEIN UA: NEGATIVE
RBC UA: NEGATIVE

## 2016-08-12 MED ORDER — FLUCONAZOLE 150 MG PO TABS: ORAL_TABLET | ORAL | 2 refills | Status: DC

## 2016-08-12 NOTE — Patient Instructions (Signed)
Mammogram at 41 Physical in 1 year Pap in 2020 Increase water in take

## 2016-08-12 NOTE — Progress Notes (Signed)
Patient ID: [name], female   DOB: 24-Nov-1975, 41 y.o.   MRN: [id number] History of Present Illness: [name] is a 41 year old black female in for well woman gyn exam.Last pap 06/16/15 was normal with negative HPV.Works at [employer] in [place].    Current Medications, Allergies, Past Medical History, Past Surgical History, Family History and Social History were reviewed in [name] record.     Review of Systems: Patient denies any headaches, hearing loss, fatigue, blurred vision, shortness of breath, chest pain, abdominal pain, problems with bowel movements,  or intercourse. No joint pain or mood swings.+burning with urination and occasional itch, gassy when eats certain foods    Physical Exam:BP 122/80 (BP Location: Left Arm, Patient Position: Sitting, Cuff Size: Normal)   Pulse 66   Ht 4' 11.75" (1.518 m)   Wt 148 lb 8 oz (67.4 kg)   LMP 07/26/2016 (Approximate)   BMI 29.25 kg/m  urine dipstick is negative. General:  Well developed, well nourished, no acute distress Skin:  Warm and dry Neck:  Midline trachea, normal thyroid, good ROM, no lymphadenopathy Lungs; Clear to auscultation bilaterally Breast:  No dominant palpable mass, retraction, or nipple discharge Cardiovascular: Regular rate and rhythm Abdomen:  Soft, non tender, no hepatosplenomegaly Pelvic:  External genitalia is normal in appearance, no lesions.  The vagina is normal in appearance. Urethra has no lesions or masses. The cervix is bulbous.  Uterus is felt to be normal size, shape, and contour.  No adnexal masses or tenderness noted.Bladder is non tender, no masses felt. Extremities/musculoskeletal:  No swelling or varicosities noted, no clubbing or cyanosis Psych:  No mood changes, alert and cooperative,seems happy PHQ 2 score 0.  Impression:  1. Well woman exam with routine gynecological exam   2. Burning with urination   3. Smoker   4. Umbilical hernia without obstruction and  without gangrene   5. Vitamin D deficiency   6. Screening cholesterol level      Plan: Check CBC,CMP,TSH and lipids.vitamin D Continue vitamin D Increase water Refilled diflucan 150 mg #1 take 1 now and repeat 1 in 3 days if needed with 2 refills Physical in 1 year Pap in 2020 Mammogram at 41

## 2016-08-13 LAB — CBC
Hematocrit: 37.4 % (ref 34.0–46.6)
Hemoglobin: 11.4 g/dL (ref 11.1–15.9)
MCH: 24.7 pg — ABNORMAL LOW (ref 26.6–33.0)
MCHC: 30.5 g/dL — AB (ref 31.5–35.7)
MCV: 81 fL (ref 79–97)
PLATELETS: 208 10*3/uL (ref 150–379)
RBC: 4.62 x10E6/uL (ref 3.77–5.28)
RDW: 16.9 % — AB (ref 12.3–15.4)
WBC: 4.9 10*3/uL (ref 3.4–10.8)

## 2016-08-13 LAB — COMPREHENSIVE METABOLIC PANEL
ALT: 10 IU/L (ref 0–32)
AST: 17 IU/L (ref 0–40)
Albumin/Globulin Ratio: 1.4 (ref 1.2–2.2)
Albumin: 4.3 g/dL (ref 3.5–5.5)
Alkaline Phosphatase: 43 IU/L (ref 39–117)
BUN/Creatinine Ratio: 16 (ref 9–23)
BUN: 10 mg/dL (ref 6–20)
Bilirubin Total: 0.3 mg/dL (ref 0.0–1.2)
CO2: 24 mmol/L (ref 18–29)
Calcium: 9.1 mg/dL (ref 8.7–10.2)
Chloride: 104 mmol/L (ref 96–106)
Creatinine, Ser: 0.61 mg/dL (ref 0.57–1.00)
GFR calc Af Amer: 132 mL/min/{1.73_m2} (ref >59)
GFR calc non Af Amer: 115 mL/min/{1.73_m2} (ref >59)
Globulin, Total: 3 g/dL (ref 1.5–4.5)
Glucose: 81 mg/dL (ref 65–99)
Potassium: 4 mmol/L (ref 3.5–5.2)
Sodium: 140 mmol/L (ref 134–144)
Total Protein: 7.3 g/dL (ref 6.0–8.5)

## 2016-08-13 LAB — LIPID PANEL
Chol/HDL Ratio: 2.9 ratio (ref 0.0–4.4)
Cholesterol, Total: 161 mg/dL (ref 100–199)
HDL: 55 mg/dL (ref >39)
LDL Calculated: 96 mg/dL (ref 0–99)
Triglycerides: 52 mg/dL (ref 0–149)
VLDL Cholesterol Cal: 10 mg/dL (ref 5–40)

## 2016-08-13 LAB — TSH: TSH: 0.72 u[IU]/mL (ref 0.450–4.500)

## 2016-08-13 LAB — VITAMIN D 25 HYDROXY (VIT D DEFICIENCY, FRACTURES): Vit D, 25-Hydroxy: 28.1 ng/mL — ABNORMAL LOW (ref 30.0–100.0)

## 2016-08-20 ENCOUNTER — Telehealth: Payer: Self-pay | Admitting: *Deleted

## 2016-08-20 NOTE — Telephone Encounter (Signed)
Informed patient that per Jennifer's note cholesterol is great, thyroid is normal,kidney and liver function are normal and so is blood sugar, vitamin D is much better at 28.1 so keep taking the vitamin D daily, and take OTC iron. Pt verbalized understanding and gratitude

## 2016-09-08 ENCOUNTER — Emergency Department (HOSPITAL_COMMUNITY)
Admission: EM | Admit: 2016-09-08 | Discharge: 2016-09-09 | Disposition: A | Discharge status: Home / Self Care | Payer: Commercial Managed Care - PPO | Attending: Emergency Medicine | Admitting: Emergency Medicine

## 2016-09-08 ENCOUNTER — Encounter (HOSPITAL_COMMUNITY): Payer: Self-pay | Admitting: *Deleted

## 2016-09-08 DIAGNOSIS — F17210 Nicotine dependence, cigarettes, uncomplicated: Secondary | ICD-10-CM | POA: Insufficient documentation

## 2016-09-08 DIAGNOSIS — I10 Essential (primary) hypertension: Secondary | ICD-10-CM | POA: Insufficient documentation

## 2016-09-08 DIAGNOSIS — Z79899 Other long term (current) drug therapy: Secondary | ICD-10-CM | POA: Insufficient documentation

## 2016-09-08 DIAGNOSIS — N760 Acute vaginitis: Secondary | ICD-10-CM | POA: Diagnosis not present

## 2016-09-08 DIAGNOSIS — B9689 Other specified bacterial agents as the cause of diseases classified elsewhere: Secondary | ICD-10-CM

## 2016-09-08 DIAGNOSIS — N898 Other specified noninflammatory disorders of vagina: Secondary | ICD-10-CM | POA: Diagnosis present

## 2016-09-08 NOTE — ED Triage Notes (Signed)
Pt reports vaginal itching since yesterday. Pt denies any other symptoms.

## 2016-09-08 NOTE — ED Provider Notes (Signed)
[facility]-EMERGENCY DEPT Provider Note   CSN: [id number] Arrival date & time: 09/08/16  2226   By signing my name below, I, [name], attest that this documentation has been prepared under the direction and in the presence of Mikayla Loveless, MD. Electronically signed, [name], ED Scribe. 09/09/16. 12:01 AM.   History   Chief Complaint Chief Complaint  Patient presents with  . Vaginal Itching   The history is provided by the patient and medical records. No language interpreter was used.    [name] is a 41 y.o. female with h/o herpes, yeast infection, BV and vaginal discharge who presents to the Emergency Department with concern for progressive vaginal itching onset ~24 hours ago. Associated white discharge noted. She states this is consistent with h/o yeast infections. No relief noted with Vagisil cream. No vaginal pain or abdominal pain. No dysuria, difficulty urinating or hematuria. No STD suspicion or recent unprotected sexual encounters noted. She denies h/o STD's. No other complaints at this time. LNMP 08/09/2016.  Past Medical History:  Diagnosis Date  . Anemia 05/31/2014  . BV (bacterial vaginosis) 06/16/2015  . Encounter for smoking cessation counseling 06/16/2015  . HSV (herpes simplex virus) infection   . Hypertension   . Smoker 06/16/2015  . Umbilical hernia 06/16/2015  . Vaginal discharge 05/30/2014  . Vitamin D deficiency 06/19/2015  . Yeast infection 05/30/2014    Patient Active Problem List   Diagnosis Date Noted  . Burning with urination 08/12/2016  . Vitamin D deficiency 06/19/2015  . BV (bacterial vaginosis) 06/16/2015  . Smoker 06/16/2015  . Umbilical hernia without obstruction and without gangrene 06/16/2015  . Encounter for smoking cessation counseling 06/16/2015  . Anemia 05/31/2014  . Vaginal discharge 05/30/2014  . Yeast infection 05/30/2014  . Routine gynecological examination 11/15/2013  . 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 CESAREAN SECTION    . TUBAL LIGATION      OB History    Gravida Para Term Preterm AB Living   3 3 2 1   3    SAB TAB Ectopic Multiple Live Births           3       Home Medications    Prior to Admission medications   Medication Sig Start Date End Date Taking? Authorizing Provider  Ascorbic Acid (VITAMIN C PO) Take 1 tablet by mouth daily.    [provider]  Cholecalciferol 5000 units capsule Take 1 capsule (5,000 Units total) by mouth daily. 06/19/15   [name], NP  fluconazole (DIFLUCAN) 150 MG tablet Take 1 now and 1 in 3 days if needed 08/12/16   [name], NP  ibuprofen (ADVIL,MOTRIN) 200 MG tablet Take 800 mg by mouth every 6 (six) hours as needed for moderate pain.    [provider]  IRON PO Take 1 tablet by mouth daily.    [provider]  metroNIDAZOLE (FLAGYL) 500 MG tablet Take 1 tablet (500 mg total) by mouth 2 (two) times daily. One po bid x 7 days 09/09/16   Mikayla Loveless, MD    Family History Family History  Problem Relation Age of Onset  . Hypertension Mother   . Hypertension Brother   . Sickle cell anemia Brother   . Kidney disease Father   . Alzheimer's disease Maternal Grandmother   . Cancer Maternal Grandfather   . Other Brother        born  with fluid on brain  . Asthma Son     Social History Social History  Substance Use Topics  . Smoking status: Current Every Day Smoker    Packs/day: 0.00    Years: 18.00    Types: Cigarettes  . Smokeless tobacco: Never Used     Comment: smokes 4-5 cig daily  . Alcohol use No     Allergies   Patient has no known allergies.   Review of Systems Review of Systems  Constitutional: Negative for fever.  Gastrointestinal: Negative for abdominal pain, nausea and vomiting.  Genitourinary: Positive for vaginal discharge. Negative for difficulty urinating, dysuria, frequency, hematuria, pelvic pain, vaginal  bleeding and vaginal pain.       +vaginal itching  All other systems reviewed and are negative.    Physical Exam Updated Vital Signs BP 129/75 (BP Location: Right Arm)   Pulse 65   Temp 98.8 F (37.1 C) (Temporal)   Resp 16   Ht 4\' 11"  (1.499 m)   Wt 147 lb (66.7 kg)   LMP 08/11/2016   SpO2 100%   BMI 29.69 kg/m   Physical Exam  Constitutional: She is oriented to person, place, and time. She appears well-developed and well-nourished.  HENT:  Head: Normocephalic and atraumatic.  Right Ear: External ear normal.  Left Ear: External ear normal.  Nose: Nose normal.  Eyes: Right eye exhibits no discharge. Left eye exhibits no discharge.  Cardiovascular: Normal rate, regular rhythm and normal heart sounds.   Pulmonary/Chest: Effort normal and breath sounds normal.  Abdominal: Soft. There is no tenderness.  Genitourinary:  Genitourinary Comments: copious white/yellow discharge on vaginal exam No CMT or uterine/adnexal tenderness  Neurological: She is alert and oriented to person, place, and time.  Skin: Skin is warm and dry.  Nursing note and vitals reviewed.    ED Treatments / Results  DIAGNOSTIC STUDIES: Oxygen Saturation is 100% on RA, NL by my interpretation.    COORDINATION OF CARE: 11:59 PM-Discussed next steps with pt. Pt verbalized understanding and is agreeable with the plan. Will order labs; pt prepared for female exam.   Labs (all labs ordered are listed, but only abnormal results are displayed) Labs Reviewed  WET PREP, GENITAL - Abnormal; Notable for the following:       Result Value   WBC, Wet Prep HPF POC MODERATE (*)    All other components within normal limits  POC URINE PREG, ED  GC/CHLAMYDIA PROBE AMP ([place]) NOT AT [facility]    EKG  EKG Interpretation None       Radiology No results found.  Procedures Procedures (including critical care time)  Medications Ordered in ED Medications  metroNIDAZOLE (FLAGYL) tablet 500 mg (500 mg Oral  Given 09/09/16 0207)     Initial Impression / Assessment and Plan / ED Course  I have reviewed the triage vital signs and the nursing notes.  Pertinent labs & imaging results that were available during my care of the patient were reviewed by me and considered in my medical decision making (see chart for details).     Presentation is most c/w BV. Discussed testing for gc/chlamydia but she does not want treatment at this time unless these come back positive. No yeast on wet prep. F/u with OB/GYN  Final Clinical Impressions(s) / ED Diagnoses   Final diagnoses:  Bacterial vaginitis    New Prescriptions Discharge Medication List as of 09/09/2016  1:39 AM    START taking these medications   Details  metroNIDAZOLE (FLAGYL) 500 MG tablet Take 1 tablet (500 mg total) by mouth 2 (two) times daily. One po bid x 7 days, Starting Mon 09/09/2016, Print       I personally performed the services described in this documentation, which was scribed in my presence. The recorded information has been reviewed and is accurate.    Mikayla LovelessGoldston, Roxy Filler, MD 09/09/16 [phone]

## 2016-09-09 LAB — WET PREP, GENITAL
Sperm: NONE SEEN
TRICH WET PREP: NONE SEEN
Yeast Wet Prep HPF POC: NONE SEEN

## 2016-09-09 LAB — POC URINE PREG, ED: PREG TEST UR: NEGATIVE

## 2016-09-09 MED ORDER — METRONIDAZOLE 500 MG PO TABS: 500.0000 mg | ORAL_TABLET | Freq: Two times a day (BID) | ORAL | 0 refills | Status: DC

## 2016-09-09 MED ORDER — METRONIDAZOLE 500 MG PO TABS
500.0000 mg | ORAL_TABLET | Freq: Once | ORAL | Status: AC
  Administered 2016-09-09: 500 mg via ORAL
  Filled 2016-09-09: qty 1

## 2016-09-10 LAB — GC/CHLAMYDIA PROBE AMP (~~LOC~~) NOT AT ARMC
Chlamydia: NEGATIVE
Neisseria Gonorrhea: NEGATIVE

## 2017-03-18 ENCOUNTER — Other Ambulatory Visit: Payer: Self-pay | Admitting: Obstetrics and Gynecology

## 2017-03-18 DIAGNOSIS — Z1231 Encounter for screening mammogram for malignant neoplasm of breast: Secondary | ICD-10-CM

## 2017-03-27 ENCOUNTER — Ambulatory Visit (HOSPITAL_COMMUNITY)
Admission: RE | Admit: 2017-03-27 | Discharge: 2017-03-27 | Disposition: A | Discharge status: Home / Self Care | Payer: Commercial Managed Care - PPO | Source: Ambulatory Visit | Attending: Obstetrics and Gynecology | Admitting: Obstetrics and Gynecology

## 2017-03-27 ENCOUNTER — Encounter (HOSPITAL_COMMUNITY): Payer: Self-pay

## 2017-03-27 DIAGNOSIS — Z1231 Encounter for screening mammogram for malignant neoplasm of breast: Secondary | ICD-10-CM | POA: Diagnosis present

## 2017-04-01 ENCOUNTER — Telehealth: Payer: Self-pay | Admitting: *Deleted

## 2017-04-01 NOTE — Telephone Encounter (Signed)
Informed patient mammogram was normal.

## 2017-05-08 ENCOUNTER — Other Ambulatory Visit: Payer: Self-pay

## 2017-05-08 ENCOUNTER — Emergency Department (HOSPITAL_COMMUNITY)
Admission: EM | Admit: 2017-05-08 | Discharge: 2017-05-08 | Disposition: A | Discharge status: Home / Self Care | Payer: Managed Care, Other (non HMO) | Attending: Emergency Medicine | Admitting: Emergency Medicine

## 2017-05-08 ENCOUNTER — Encounter (HOSPITAL_COMMUNITY): Payer: Self-pay | Admitting: Emergency Medicine

## 2017-05-08 DIAGNOSIS — N898 Other specified noninflammatory disorders of vagina: Secondary | ICD-10-CM | POA: Diagnosis present

## 2017-05-08 DIAGNOSIS — I10 Essential (primary) hypertension: Secondary | ICD-10-CM | POA: Diagnosis not present

## 2017-05-08 DIAGNOSIS — B9689 Other specified bacterial agents as the cause of diseases classified elsewhere: Secondary | ICD-10-CM | POA: Insufficient documentation

## 2017-05-08 DIAGNOSIS — N760 Acute vaginitis: Secondary | ICD-10-CM | POA: Diagnosis not present

## 2017-05-08 DIAGNOSIS — Z79899 Other long term (current) drug therapy: Secondary | ICD-10-CM | POA: Insufficient documentation

## 2017-05-08 DIAGNOSIS — B373 Candidiasis of vulva and vagina: Secondary | ICD-10-CM | POA: Diagnosis not present

## 2017-05-08 DIAGNOSIS — B3731 Acute candidiasis of vulva and vagina: Secondary | ICD-10-CM

## 2017-05-08 DIAGNOSIS — M7521 Bicipital tendinitis, right shoulder: Secondary | ICD-10-CM | POA: Diagnosis not present

## 2017-05-08 DIAGNOSIS — F17210 Nicotine dependence, cigarettes, uncomplicated: Secondary | ICD-10-CM | POA: Insufficient documentation

## 2017-05-08 LAB — URINALYSIS, ROUTINE W REFLEX MICROSCOPIC
Bilirubin Urine: NEGATIVE
GLUCOSE, UA: NEGATIVE mg/dL
Hgb urine dipstick: NEGATIVE
KETONES UR: NEGATIVE mg/dL
NITRITE: NEGATIVE
Protein, ur: NEGATIVE mg/dL
Specific Gravity, Urine: 1.016 (ref 1.005–1.030)
pH: 5 (ref 5.0–8.0)

## 2017-05-08 LAB — WET PREP, GENITAL
Sperm: NONE SEEN
Trich, Wet Prep: NONE SEEN

## 2017-05-08 LAB — POC URINE PREG, ED: PREG TEST UR: NEGATIVE

## 2017-05-08 MED ORDER — FLUCONAZOLE 150 MG PO TABS
150.0000 mg | ORAL_TABLET | Freq: Once | ORAL | Status: AC
  Administered 2017-05-08: 150 mg via ORAL
  Filled 2017-05-08: qty 1

## 2017-05-08 MED ORDER — METRONIDAZOLE 500 MG PO TABS: 500.0000 mg | ORAL_TABLET | Freq: Two times a day (BID) | ORAL | 0 refills | Status: DC

## 2017-05-08 MED ORDER — NAPROXEN 500 MG PO TABS: 500.0000 mg | ORAL_TABLET | Freq: Two times a day (BID) | ORAL | 0 refills | Status: DC

## 2017-05-08 NOTE — Discharge Instructions (Signed)
Apply a heating pad to your bicep muscle as discussed for 20 minutes several times daily.  Avoid activity that stresses your muscle (lifting, pulling, etc).  Call Dr Romeo AppleHarrison for further evaluation of your shoulder pain if this is not improving over the next 1-2 weeks.  You have been prescribed medicine to help with your vaginal symptoms.  Plan to see your gynecologist if your symptoms persist.

## 2017-05-08 NOTE — ED Triage Notes (Signed)
Patient complains of right arm pain x 2 weeks states pain with movement. Also complains of vaginal discharge and itching 3 days.

## 2017-05-09 LAB — GC/CHLAMYDIA PROBE AMP (~~LOC~~) NOT AT ARMC
Chlamydia: NEGATIVE
Neisseria Gonorrhea: NEGATIVE

## 2017-05-09 NOTE — ED Provider Notes (Signed)
[facility] EMERGENCY DEPARTMENT Provider Note   CSN: [id number] Arrival date & time: 05/08/17  4098     History   Chief Complaint Chief Complaint  Patient presents with  . Arm Pain  . Vaginitis    HPI Heidi Maclin is a 42 y.o. female with past medical history as outlined below presenting with 2 complaints, the first being vaginal itching with white thin vaginal discharge which she suspects may be a yeast infection which she has had in the past.  She denies pelvic pain, n/v/ fever or risk factors for stds.  Secondly, has right shoulder pain which has been present for several weeks.  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  She denies radiation of pain which is sharp and triggered by movement, resolves at rest.  She denies cp, sob, weakness or numbness in her hand.  She has had no treatment prior to arrival.  The history is provided by the patient.    Past Medical History:  Diagnosis Date  . Anemia 05/31/2014  . BV (bacterial vaginosis) 06/16/2015  . Encounter for smoking cessation counseling 06/16/2015  . HSV (herpes simplex virus) infection   . Hypertension   . Smoker 06/16/2015  . Umbilical hernia 06/16/2015  . Vaginal discharge 05/30/2014  . Vitamin D deficiency 06/19/2015  . Yeast infection 05/30/2014    Patient Active Problem List   Diagnosis Date Noted  . Burning with urination 08/12/2016  . Vitamin D deficiency 06/19/2015  . BV (bacterial vaginosis) 06/16/2015  . Smoker 06/16/2015  . Umbilical hernia without obstruction and without gangrene 06/16/2015  . Encounter for smoking cessation counseling 06/16/2015  . Anemia 05/31/2014  . Vaginal discharge 05/30/2014  . Yeast infection 05/30/2014  . Routine gynecological examination 11/15/2013  . 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 CESAREAN SECTION    . TUBAL  LIGATION      OB History    Gravida Para Term Preterm AB Living   3 3 2 1   3    SAB TAB Ectopic Multiple Live Births           3       Home Medications    Prior to Admission medications   Medication Sig Start Date End Date Taking? Authorizing Provider  Ascorbic Acid (VITAMIN C PO) Take 1 tablet by mouth daily.    [provider]  Cholecalciferol 5000 units capsule Take 1 capsule (5,000 Units total) by mouth daily. 06/19/15   [name], NP  fluconazole (DIFLUCAN) 150 MG tablet Take 1 now and 1 in 3 days if needed 08/12/16   [name], NP  ibuprofen (ADVIL,MOTRIN) 200 MG tablet Take 800 mg by mouth every 6 (six) hours as needed for moderate pain.    [provider]  IRON PO Take 1 tablet by mouth daily.    [provider]  metroNIDAZOLE (FLAGYL) 500 MG tablet Take 1 tablet (500 mg total) by mouth 2 (two) times daily. 05/08/17   Burgess Amor, PA-C  naproxen (NAPROSYN) 500 MG tablet Take 1 tablet (500 mg total) by mouth 2 (two) times daily. 05/08/17   Burgess Amor, PA-C    Family History Family History  Problem Relation Age of Onset  . Hypertension Mother   . Hypertension Brother   . Sickle cell anemia Brother   .  Kidney disease Father   . Alzheimer's disease Maternal Grandmother   . Cancer Maternal Grandfather   . Other Brother        born with fluid on brain  . Asthma Son     Social History Social History   Tobacco Use  . Smoking status: Current Every Day Smoker    Packs/day: 0.00    Years: 18.00    Pack years: 0.00    Types: Cigarettes  . Smokeless tobacco: Never Used  . Tobacco comment: smokes 4-5 cig daily  Substance Use Topics  . Alcohol use: No  . Drug use: No     Allergies   Patient has no known allergies.   Review of Systems Review of Systems  Constitutional: Negative for chills and fever.  HENT: Negative for congestion and sore throat.   Eyes: Negative.   Respiratory: Negative for chest tightness and  shortness of breath.   Cardiovascular: Negative for chest pain.  Gastrointestinal: Negative for abdominal pain, nausea and vomiting.  Genitourinary: Positive for vaginal discharge. Negative for dysuria, flank pain and pelvic pain.  Musculoskeletal: Positive for arthralgias. Negative for joint swelling and neck pain.  Skin: Negative.  Negative for rash and wound.  Neurological: Negative for dizziness, weakness, light-headedness, numbness and headaches.  Psychiatric/Behavioral: Negative.      Physical Exam Updated Vital Signs BP 123/88 (BP Location: Left Arm)   Pulse 65   Temp 98.7 F (37.1 C) (Oral)   Resp 14   Ht 4\' 11"  (1.499 m)   Wt 63.5 kg (140 lb)   LMP 04/24/2017   SpO2 100%   BMI 28.28 kg/m   Physical Exam  Constitutional: She appears well-developed and well-nourished.  HENT:  Head: Normocephalic and atraumatic.  Eyes: Conjunctivae are normal.  Neck: Normal range of motion.  Cardiovascular: Normal rate.  Pulmonary/Chest: Effort normal and breath sounds normal.  Abdominal: Soft. Bowel sounds are normal. There is no tenderness.  Genitourinary: Uterus is not tender. Cervix exhibits no motion tenderness and no discharge. Right adnexum displays no mass, no tenderness and no fullness. Left adnexum displays no mass, no tenderness and no fullness. Vaginal discharge found.  Genitourinary Comments: Thin white dc in vagina. No cervical dc.  Musculoskeletal: Normal range of motion.       Right upper arm: She exhibits tenderness. She exhibits no bony tenderness, no edema and no deformity.  Pt point tender over proximal right biceps and bicep tendon without edema, deformity, erythema.  5/5 strength with forearm  active and passive flexion but with eliciting pain at the proximal bicep heads.  Pt has FROM of right shoulder and elbow.   Neurological: She is alert.  Skin: Skin is warm and dry.  Psychiatric: She has a normal mood and affect.  Nursing note and vitals reviewed.    ED  Treatments / Results  Labs (all labs ordered are listed, but only abnormal results are displayed) Labs Reviewed  WET PREP, GENITAL - Abnormal; Notable for the following components:      Result Value   Yeast Wet Prep HPF POC PRESENT (*)    Clue Cells Wet Prep HPF POC PRESENT (*)    WBC, Wet Prep HPF POC MANY (*)    All other components within normal limits  URINALYSIS, ROUTINE W REFLEX MICROSCOPIC - Abnormal; Notable for the following components:   APPearance HAZY (*)    Leukocytes, UA SMALL (*)    Bacteria, UA RARE (*)    Squamous Epithelial / LPF 6-30 (*)  All other components within normal limits  POC URINE PREG, ED  GC/CHLAMYDIA PROBE AMP ([place]) NOT AT [facility]    EKG  EKG Interpretation None       Radiology No results found.  Procedures Procedures (including critical care time)  Medications Ordered in ED Medications  fluconazole (DIFLUCAN) tablet 150 mg (150 mg Oral Given 05/08/17 1314)     Initial Impression / Assessment and Plan / ED Course  I have reviewed the triage vital signs and the nursing notes.  Pertinent labs & imaging results that were available during my care of the patient were reviewed by me and considered in my medical decision making (see chart for details).     Pt placed on naproxen, discussed ice and heat tx, resting the arm, limiting lifting for the next week.  Wet prep positive for yeast and bv.  She was given a diflucan tab here, prescribed flagyl, cautioned re etoh consumption. She is aware other cx pending.  Advised f/u with ortho for a recheck of suspected bicep tendinitis if sx persist despite todays tx.    Final Clinical Impressions(s) / ED Diagnoses   Final diagnoses:  Biceps tendinitis of right shoulder  Yeast vaginitis  Bacterial vaginitis    ED Discharge Orders        Ordered    naproxen (NAPROSYN) 500 MG tablet  2 times daily     05/08/17 1234    metroNIDAZOLE (FLAGYL) 500 MG tablet  2 times daily     05/08/17 1234         Burgess Amor, PA-C 05/09/17 1819    Mancel Bale, MD 05/13/17 [phone]

## 2017-05-16 ENCOUNTER — Telehealth: Payer: Self-pay | Admitting: Family Medicine

## 2017-05-16 NOTE — Telephone Encounter (Signed)
LVM on [phone] -ok for New patient per Dr Delton SeeNelson

## 2017-06-13 ENCOUNTER — Other Ambulatory Visit (HOSPITAL_COMMUNITY)
Admission: RE | Admit: 2017-06-13 | Discharge: 2017-06-13 | Disposition: A | Discharge status: Home / Self Care | Payer: Managed Care, Other (non HMO) | Source: Ambulatory Visit | Attending: Family Medicine | Admitting: Family Medicine

## 2017-06-13 ENCOUNTER — Ambulatory Visit (INDEPENDENT_AMBULATORY_CARE_PROVIDER_SITE_OTHER): Payer: Managed Care, Other (non HMO) | Admitting: Family Medicine

## 2017-06-13 ENCOUNTER — Encounter: Payer: Self-pay | Admitting: Family Medicine

## 2017-06-13 VITALS — BP 120/64 | HR 72 | Temp 98.4°F | Resp 16 | Ht 59.0 in

## 2017-06-13 DIAGNOSIS — R350 Frequency of micturition: Secondary | ICD-10-CM

## 2017-06-13 DIAGNOSIS — R5383 Other fatigue: Secondary | ICD-10-CM | POA: Diagnosis present

## 2017-06-13 DIAGNOSIS — Z720 Tobacco use: Secondary | ICD-10-CM

## 2017-06-13 DIAGNOSIS — Z23 Encounter for immunization: Secondary | ICD-10-CM

## 2017-06-13 LAB — POCT URINALYSIS DIPSTICK
Bilirubin, UA: NEGATIVE
GLUCOSE UA: NEGATIVE
Ketones, UA: NEGATIVE
LEUKOCYTES UA: NEGATIVE
Nitrite, UA: NEGATIVE
PROTEIN UA: 7
Spec Grav, UA: 1.02 (ref 1.010–1.025)
Urobilinogen, UA: 1 E.U./dL
pH, UA: 7 (ref 5.0–8.0)

## 2017-06-13 MED ORDER — VARENICLINE TARTRATE 0.5 MG X 11 & 1 MG X 42 PO MISC: ORAL | 0 refills | Status: DC

## 2017-06-13 NOTE — Progress Notes (Signed)
Patient ID: [name], female    DOB: 12-14-1975, 42 y.o.   MRN: [id number]  Chief Complaint  Patient presents with  . Dysuria  . Flank Pain  . Urinary Pain    Allergies Patient has no known allergies.  Subjective:   [name] is a 42 y.o. female who presents to [facility] today.  HPI [name] presents as a new patient visit to establish care.  She reports that when she initially called and made this appointment that she was having some cramping and frequency with urination.  She reports that she was not sure if her symptoms were due to her menses or if it was due to a urinary tract infection.  She reports that since that time she is not having any burning with urination or any pelvic cramping.  She reports that she did finish her menses approximately 1 day ago.  She reports that she still occasionally has some urinary frequency and would like to be checked for urinary tract infection.  She denies any fever, chills, nausea, vomiting, back pain, vaginal discharge.  She denies any history of recurrent urinary tract infections.  She is sexually active with her husband.  She does want to be checked for gonorrhea, chlamydia, and Trichomonas.  She does not want to be checked for hepatitis, HIV, and syphilis.  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  She is married and has 3 children.  She lives in [place].  She reports that she needed to establish care with a primary care physician.  She is interested in quitting smoking.  She reports that she smokes 1 pack/day and has smoked for over 20 years.  She reports she did quit smoking during her pregnancy.  She reports that she feels like she smokes because she gets anxious and stressed with everything going on in her life.  She reports that she lives in a bad neighborhood and is trying to find a house that she can rent to get out of this neighborhood.  She denies any alcohol or drug  use.  She does have a family history significant for hypertension in renal disease.  She reports that she has never had high blood pressure in the past or with her pregnancies.  She has had a tubal ligation.  She reports that her menses are regular each month.  She reports they are heavy and last 4-5 days.  She reports she has been experiencing some fatigue but believes this is due to the fact that she has been very busy with work and taking care of everything around her house.  She reports that her kids and her husband do not help her as much as they should.  She denies feeling down, depressed, or hopeless.  She reports that her appetite is good.  She has no prior history of depression.  She denies any suicidal or homicidal ideation.  Denies any auditory or visual hallucination.  She would like to quit smoking and would be willing to take medication for this.  She is trying to get her daughter to work on her diet because she reports her daughter is overweight and this concerns her.  She believes that if she would quit smoking would help her daughter to make better choices regarding her diet.     Past Medical History:  Diagnosis Date  . Anemia 05/31/2014  . BV (bacterial vaginosis) 06/16/2015  . Encounter for smoking cessation counseling 06/16/2015  .  HSV (herpes simplex virus) infection   . Hypertension   . Smoker 06/16/2015  . Umbilical hernia 06/16/2015  . Vaginal discharge 05/30/2014  . Vitamin D deficiency 06/19/2015  . Yeast infection 05/30/2014    Past Surgical History:  Procedure Laterality Date  . CESAREAN SECTION    . TUBAL LIGATION      Family History  Problem Relation Age of Onset  . Hypertension Mother   . Hypertension Brother   . Sickle cell anemia Brother   . Kidney disease Father   . Alzheimer's disease Maternal Grandmother   . Cancer Maternal Grandfather   . Other Brother        born with fluid on brain  . Asthma Son      Social History   Socioeconomic History  .  Marital status: Legally Separated    Spouse name: None  . Number of children: None  . Years of education: None  . Highest education level: None  Social Needs  . Financial resource strain: None  . Food insecurity - worry: None  . Food insecurity - inability: None  . Transportation needs - medical: None  . Transportation needs - non-medical: None  Occupational History  . None  Tobacco Use  . Smoking status: Current Every Day Smoker    Packs/day: 0.00    Years: 18.00    Pack years: 0.00    Types: Cigarettes  . Smokeless tobacco: Never Used  . Tobacco comment: smokes 4-5 cig daily  Substance and Sexual Activity  . Alcohol use: No  . Drug use: No  . Sexual activity: Yes    Birth control/protection: Surgical    Comment: tubal  Other Topics Concern  . None  Social History Narrative   Works at [employer].    Married, has 3 children, 17, 12, 6.     Review of Systems  Constitutional: Positive for fatigue. Negative for activity change, appetite change, fever and unexpected weight change.  Eyes: Negative for visual disturbance.  Respiratory: Negative for cough, chest tightness and shortness of breath.   Cardiovascular: Negative for chest pain, palpitations and leg swelling.  Gastrointestinal: Negative for abdominal pain, nausea and vomiting.  Genitourinary: Positive for frequency. Negative for difficulty urinating, dyspareunia, dysuria, hematuria, urgency, vaginal discharge and vaginal pain.  Musculoskeletal: Negative for arthralgias.  Skin: Negative for rash.  Neurological: Negative for dizziness, syncope and light-headedness.  Hematological: Negative for adenopathy.  Psychiatric/Behavioral: Negative for agitation, behavioral problems, decreased concentration, dysphoric mood, self-injury, sleep disturbance and suicidal ideas. The patient is not nervous/anxious.      Objective:   BP 120/64 (BP Location: Left Arm, Patient Position: Sitting, Cuff Size: Normal)   Pulse 72   Temp 98.4  F (36.9 C) (Temporal)   Resp 16   Ht 4\' 11"  (1.499 m)   SpO2 99%   BMI 28.28 kg/m   Physical Exam Vital signs reviewed.  Pleasant African-American female in no acute distress.  Appearance consistent with stated age.  Heart regular rate and rhythm.  Lungs clear to auscultation bilaterally.  Neck supple with full range of motion.  Thyroid not palpable.  No JVD noted.  Psychiatric: Mood euthymic.  Affect congruent with mood.  No suicidal or homicidal ideations.  No auditory or visual hallucinations.  Thought process is goal-directed without delusions, phobias, obsessions, or compulsions.  Cranial nerves II through XII grossly intact.  Skin: No rashes or lesions noted on exposed areas.  Normocephalic atraumatic.  Pupils equally round and reactive to light.  Extraocular  muscles intact.  No lesions in oral cavity.  Abdomen: Soft, nondistended nontender.  Positive bowel sounds.  No suprapubic tenderness to palpation.  No hepatosplenomegaly present.  No CVA tenderness to palpation.  Assessment and Plan  1. Fatigue, unspecified type Did discuss with patient that I believe her fatigue is most likely secondary to her lifestyle.  We did discuss lifestyle modifications such as dietary changes and exercise which could help her energy levels.  We did discuss that she could have some underlying anxiety.  We will discuss this at a further visit and not initiate any medications at this point.  Will check baseline labs today.  Patient will follow-up in 1 month for complete physical exam. - CBC with Differential/Platelet - TSH - VITAMIN D 25 Hydroxy (Vit-D Deficiency, Fractures) - COMPLETE METABOLIC PANEL WITH GFR - Vitamin B12  2. Urinary frequency Patient reports some sporadic urinary frequency which can be worsened around her menses or with caffeine intake.  Point-of-care urinalysis did not show evidence of a urinary tract infection.  Will check culture.  UA today did show some blood.  Patient is just finishing  up her menses at this time.  We will plan to recheck her UA at follow-up.  She does request check for sexually transmitted infections today. - Urine Culture - Urine cytology ancillary only - POCT Urinalysis Dipstick  3. Immunization due Exudation given - Tdap vaccine greater than or equal to 7yo IM  4. Tobacco abuse The 5 A's Model for treating Tobacco Use and Dependence was used today. I have identified and documented tobacco use status for this patient. I have urged the patient to quit tobacco use. At this time, the patient is willing and ready to attempt to quit. We have discussed medication options to aid in smoking cessation including nicotine replacement therapy (NRT), zyban, and chantix. We have also discussed behavioral modifications, lifestyle changes, and patient support options to aid in tobacco cessation success. Patient was congratulated on their desire to make this positive change for their health. Patient instructed to keep their follow up to ensure success.   We discussed chantix for smoking cessation. Discussed that serious neuropsychiatric adverse events have been reported in patients treated with this medication, including changes in mood, psychosis, hallucinations, paranoia, delusion, homicidal ideation, aggression, hostility, agitation, anxiety, suicide ideation, suicide attempt, and completed suicide. Advised patient that if taking this medication and any symptoms occur that the patient should STOP taking chantix and contact a healthcare provider via the ED or our office. Discussed that other common adverse reactions can occur with this medication such as nausea, abnormal dreams, constipation, gas, and vomiting. Patient told that upon starting medication that they should use caution when driving or operating machinery or engaging in hazardous activity until they know how this medication will affect them. Patient understands the risk of this medication and voiced understanding.  Agrees to keep follow up appointments and follow the treatment plan.   - varenicline (CHANTIX STARTING MONTH PAK) 0.5 MG X 11 & 1 MG X 42 tablet; Take one 0.5 mg tablet by mouth once daily for 3 days, then increase to one 0.5 mg tablet twice daily for 4 days, then increase to one 1 mg tablet twice daily.  Dispense: 53 tablet; Refill: 0 OV was greater than 30 minutes.  Greater than 50% of office visit spent counseling and coordinating care. Return in about 4 weeks (around 07/11/2017) for follow up. Aliene Beamsachel Corrissa Martello, MD 06/13/2017

## 2017-06-13 NOTE — Patient Instructions (Signed)
Steps to Quit Smoking Smoking tobacco can be bad for your health. It can also affect almost every organ in your body. Smoking puts you and people around you at risk for many serious long-lasting (chronic) diseases. Quitting smoking is hard, but it is one of the best things that you can do for your health. It is never too late to quit. What are the benefits of quitting smoking? When you quit smoking, you lower your risk for getting serious diseases and conditions. They can include:  Lung cancer or lung disease.  Heart disease.  Stroke.  Heart attack.  Not being able to have children (infertility).  Weak bones (osteoporosis) and broken bones (fractures).  If you have coughing, wheezing, and shortness of breath, those symptoms may get better when you quit. You may also get sick less often. If you are pregnant, quitting smoking can help to lower your chances of having a baby of low birth weight. What can I do to help me quit smoking? Talk with your doctor about what can help you quit smoking. Some things you can do (strategies) include:  Quitting smoking totally, instead of slowly cutting back how much you smoke over a period of time.  Going to in-person counseling. You are more likely to quit if you go to many counseling sessions.  Using resources and support systems, such as: ? Online chats with a counselor. ? Phone quitlines. ? Printed self-help materials. ? Support groups or group counseling. ? Text messaging programs. ? Mobile phone apps or applications.  Taking medicines. Some of these medicines may have nicotine in them. If you are pregnant or breastfeeding, do not take any medicines to quit smoking unless your doctor says it is okay. Talk with your doctor about counseling or other things that can help you.  Talk with your doctor about using more than one strategy at the same time, such as taking medicines while you are also going to in-person counseling. This can help make  quitting easier. What things can I do to make it easier to quit? Quitting smoking might feel very hard at first, but there is a lot that you can do to make it easier. Take these steps:  Talk to your family and friends. Ask them to support and encourage you.  Call phone quitlines, reach out to support groups, or work with a counselor.  Ask people who smoke to not smoke around you.  Avoid places that make you want (trigger) to smoke, such as: ? Bars. ? Parties. ? Smoke-break areas at work.  Spend time with people who do not smoke.  Lower the stress in your life. Stress can make you want to smoke. Try these things to help your stress: ? Getting regular exercise. ? Deep-breathing exercises. ? Yoga. ? Meditating. ? Doing a body scan. To do this, close your eyes, focus on one area of your body at a time from head to toe, and notice which parts of your body are tense. Try to relax the muscles in those areas.  Download or buy apps on your mobile phone or tablet that can help you stick to your quit plan. There are many free apps, such as QuitGuide from the CDC (Centers for Disease Control and Prevention). You can find more support from smokefree.gov and other websites.  This information is not intended to replace advice given to you by your health care provider. Make sure you discuss any questions you have with your health care provider. Document Released: 02/02/2009 Document   Revised: 12/05/2015 Document Reviewed: 08/23/2014 Elsevier Interactive Patient Education  2018 Elsevier Inc.  

## 2017-06-14 LAB — CBC WITH DIFFERENTIAL/PLATELET
BASOS ABS: 21 {cells}/uL (ref 0–200)
Basophils Relative: 0.4 %
EOS ABS: 52 {cells}/uL (ref 15–500)
EOS PCT: 1 %
HEMATOCRIT: 33.1 % — AB (ref 35.0–45.0)
HEMOGLOBIN: 10.3 g/dL — AB (ref 11.7–15.5)
LYMPHS ABS: 1950 {cells}/uL (ref 850–3900)
MCH: 24.5 pg — AB (ref 27.0–33.0)
MCHC: 31.1 g/dL — ABNORMAL LOW (ref 32.0–36.0)
MCV: 78.8 fL — ABNORMAL LOW (ref 80.0–100.0)
MPV: 12.1 fL (ref 7.5–12.5)
Monocytes Relative: 7.4 %
NEUTROS ABS: 2792 {cells}/uL (ref 1500–7800)
Neutrophils Relative %: 53.7 %
Platelets: 208 10*3/uL (ref 140–400)
RBC: 4.2 10*6/uL (ref 3.80–5.10)
RDW: 16.3 % — ABNORMAL HIGH (ref 11.0–15.0)
Total Lymphocyte: 37.5 %
WBC mixed population: 385 cells/uL (ref 200–950)
WBC: 5.2 10*3/uL (ref 3.8–10.8)

## 2017-06-14 LAB — URINE CULTURE
MICRO NUMBER: 90236323
SPECIMEN QUALITY:: ADEQUATE

## 2017-06-14 LAB — COMPLETE METABOLIC PANEL WITH GFR
AG Ratio: 1.4 (calc) (ref 1.0–2.5)
ALBUMIN MSPROF: 4.2 g/dL (ref 3.6–5.1)
ALT: 11 U/L (ref 6–29)
AST: 14 U/L (ref 10–30)
Alkaline phosphatase (APISO): 39 U/L (ref 33–115)
BUN: 13 mg/dL (ref 7–25)
CALCIUM: 9.1 mg/dL (ref 8.6–10.2)
CO2: 25 mmol/L (ref 20–32)
Chloride: 107 mmol/L (ref 98–110)
Creat: 0.63 mg/dL (ref 0.50–1.10)
GFR, EST AFRICAN AMERICAN: 130 mL/min/{1.73_m2} (ref >=60)
GFR, EST NON AFRICAN AMERICAN: 112 mL/min/{1.73_m2} (ref >=60)
GLUCOSE: 84 mg/dL (ref 65–99)
Globulin: 2.9 g/dL (calc) (ref 1.9–3.7)
Potassium: 4.1 mmol/L (ref 3.5–5.3)
Sodium: 142 mmol/L (ref 135–146)
TOTAL PROTEIN: 7.1 g/dL (ref 6.1–8.1)
Total Bilirubin: 0.3 mg/dL (ref 0.2–1.2)

## 2017-06-14 LAB — VITAMIN B12: VITAMIN B 12: 489 pg/mL (ref 200–1100)

## 2017-06-14 LAB — VITAMIN D 25 HYDROXY (VIT D DEFICIENCY, FRACTURES): Vit D, 25-Hydroxy: 33 ng/mL (ref 30–100)

## 2017-06-14 LAB — TSH: TSH: 0.8 mIU/L

## 2017-06-16 LAB — URINE CYTOLOGY ANCILLARY ONLY
CHLAMYDIA, DNA PROBE: NEGATIVE
Neisseria Gonorrhea: NEGATIVE
Trichomonas: NEGATIVE

## 2017-06-18 ENCOUNTER — Encounter: Payer: Self-pay | Admitting: Family Medicine

## 2017-06-18 ENCOUNTER — Telehealth: Payer: Self-pay | Admitting: Family Medicine

## 2017-06-18 DIAGNOSIS — D649 Anemia, unspecified: Secondary | ICD-10-CM

## 2017-06-18 LAB — URINE CYTOLOGY ANCILLARY ONLY
BACTERIAL VAGINITIS: POSITIVE — AB
CANDIDA VAGINITIS: NEGATIVE

## 2017-06-18 MED ORDER — METRONIDAZOLE 500 MG PO TABS: 500.0000 mg | ORAL_TABLET | Freq: Two times a day (BID) | ORAL | 0 refills | Status: DC

## 2017-06-18 NOTE — Telephone Encounter (Signed)
Called patient regarding message below. No answer, unable to leave message.  

## 2017-06-18 NOTE — Telephone Encounter (Signed)
Please call and advised that the testing for gonorrhea, chlamydia, and Trichomonas were negative.  She did have a bacterial vaginosis infection.  She should be treated with Flagyl 500 mg, 1 p.o. twice a day.  She should take this for 7 days.  She should not drink alcohol while taking this medication or drink alcohol within 3 days of stopping the medication.  In addition, advise her that she is very anemic.  I would like for her to come in for some additional iron studies and then if indicated I will call in iron pills for her.  Please advise her to make sure she keeps her scheduled follow-up appointment.  I hope she is ready for her quit date to quit smoking on March 1. Lab orders placed. She does not need to be fasting. Janine Limboachel H. Tracie HarrierHagler, MD

## 2017-06-19 ENCOUNTER — Telehealth: Payer: Self-pay | Admitting: Family Medicine

## 2017-06-19 NOTE — Telephone Encounter (Signed)
Pt is calling stating that [facility] faxed over a form for us to complete regarding Stop Smoking RX. [facility] in [place].. Please call the pt, regarding.[phone]

## 2017-06-19 NOTE — Telephone Encounter (Signed)
Called patient regarding message below. No answer, unable to leave message.  

## 2017-06-20 NOTE — Telephone Encounter (Signed)
Called patient regarding message below. No answer, unable to leave message.  

## 2017-06-23 NOTE — Telephone Encounter (Signed)
I have been unable to reach this patient by phone.  A letter is being sent.

## 2017-06-23 NOTE — Telephone Encounter (Signed)
I received a fax from the pharmacy saying this medication as not covered by insurance. I will attempt a PA.

## 2017-07-02 NOTE — Telephone Encounter (Signed)
Patient informed of message below, verbalized understanding.  

## 2017-07-02 NOTE — Telephone Encounter (Signed)
Please call and advise patient of the diagnosis of bacterial vaginosis.  Please see note that I sent her in my chart and read to her.  I am getting a notification that she has not reviewed her my chart message.Janine Limboachel H. Tracie HarrierHagler, MD

## 2017-07-02 NOTE — Telephone Encounter (Signed)
Called patient regarding message below. No answer, left generic message for patient to return call.   

## 2017-07-07 LAB — IRON,TIBC AND FERRITIN PANEL
%SAT: 13 % (calc) (ref 11–50)
FERRITIN: 23 ng/mL (ref 10–232)
IRON: 49 ug/dL (ref 40–190)
TIBC: 368 ug/dL (ref 250–450)

## 2017-07-07 LAB — HEMOGLOBINOPATHY EVALUATION
Fetal Hemoglobin Testing: <1 % (ref 0.0–1.9)
HCT: 32.6 % — ABNORMAL LOW (ref 35.0–45.0)
Hemoglobin A2 - HGBRFX: 2.2 % (ref 1.8–3.5)
Hemoglobin: 10.1 g/dL — ABNORMAL LOW (ref 11.7–15.5)
Hgb A: 96.8 % (ref >96.0)
MCH: 24.9 pg — ABNORMAL LOW (ref 27.0–33.0)
MCV: 80.3 fL (ref 80.0–100.0)
RBC: 4.06 10*6/uL (ref 3.80–5.10)
RDW: 17.1 % — ABNORMAL HIGH (ref 11.0–15.0)

## 2017-07-08 ENCOUNTER — Telehealth: Payer: Self-pay | Admitting: Family Medicine

## 2017-07-08 MED ORDER — FERROUS SULFATE 325 (65 FE) MG PO TABS: 325.0000 mg | ORAL_TABLET | Freq: Two times a day (BID) | ORAL | 3 refills | Status: DC

## 2017-07-08 NOTE — Telephone Encounter (Signed)
Please call patient and advise that she has normal hemoglobin type.  She is anemic with a hemoglobin of 10.1.  Her iron studies are indicative of iron deficiency anemia.  She can start ferrous sulfate 325 mg, 1 p.o. twice daily.  She should take this medication with small amount of orange juice to increase the absorption.  Iron pills can cause constipation.  If she gets constipated while taking the medicine she can start Colace 100 mg, 1 p.o. twice a day as needed.  She should keep her scheduled follow-up to discuss her lab tests.  We will send this medication into her pharmacy.

## 2017-07-08 NOTE — Telephone Encounter (Signed)
Patient left message on my line asking for lab results.

## 2017-07-08 NOTE — Telephone Encounter (Signed)
Called patient regarding message below. No answer, unable to leave message. Phone kept giving busy tone

## 2017-07-10 NOTE — Telephone Encounter (Signed)
Called patient regarding message below. No answer, unable to leave message.  

## 2017-07-11 NOTE — Telephone Encounter (Signed)
Called patient regarding message below. No answer, unable to leave message.  

## 2017-07-11 NOTE — Telephone Encounter (Signed)
I have been unable to reach this patient by phone.  A letter is being sent.

## 2017-07-22 ENCOUNTER — Ambulatory Visit: Payer: Managed Care, Other (non HMO) | Admitting: Family Medicine

## 2017-08-19 ENCOUNTER — Ambulatory Visit: Payer: Managed Care, Other (non HMO) | Admitting: Family Medicine

## 2017-09-16 ENCOUNTER — Ambulatory Visit (INDEPENDENT_AMBULATORY_CARE_PROVIDER_SITE_OTHER): Payer: Managed Care, Other (non HMO) | Admitting: Family Medicine

## 2017-09-16 ENCOUNTER — Other Ambulatory Visit: Payer: Self-pay

## 2017-09-16 ENCOUNTER — Encounter: Payer: Self-pay | Admitting: Family Medicine

## 2017-09-16 VITALS — BP 118/80 | HR 92 | Temp 98.3°F | Resp 14 | Ht 59.0 in | Wt 140.0 lb

## 2017-09-16 DIAGNOSIS — Z720 Tobacco use: Secondary | ICD-10-CM

## 2017-09-16 DIAGNOSIS — I8393 Asymptomatic varicose veins of bilateral lower extremities: Secondary | ICD-10-CM

## 2017-09-16 DIAGNOSIS — D500 Iron deficiency anemia secondary to blood loss (chronic): Secondary | ICD-10-CM | POA: Diagnosis not present

## 2017-09-16 MED ORDER — NICOTINE 21 MG/24HR TD PT24: 21.0000 mg | MEDICATED_PATCH | Freq: Every day | TRANSDERMAL | 0 refills | Status: DC

## 2017-09-16 NOTE — Progress Notes (Signed)
Patient ID: [name], female    DOB: 1975/10/24, 42 y.o.   MRN: [id number]  Chief Complaint  Patient presents with  . Fatigue    follow up    Allergies Patient has no known allergies.  Subjective:   [name] is a 42 y.o. female who presents to [facility] today.  HPI [name] presents here for follow up.  She reports that she has been taking the iron pills twice a day. Just takes them with water. Feels like energy is better. No CP. No SOB. Energy good. Mood is good. No constipation. Menses monthly. The first day is heavy but then it is not bad. Lasts a few days. Not bothersome. Eats some red meat.   Reports that she does get some heaviness and pain in her legs.  Has had a history of varicose veins in her legs for years.  Is on her feet a lot due to work.  Does not wear compression hose.  Does not get swelling in her legs but reports a heaviness feeling.  Patient does smoke cigarettes about half a pack a day.  Is interested in quitting.  Would like to try the nicotine patches.  Her insurance company will not pay for the Chantix and she reports that is too expensive.  Reports that she has been getting some acne bumps on her face.  Reports she has been using cocoa butter on her face for the past several months.  Had never had these bumps on her face in the past.  Does use soap to wash her face.   Past Medical History:  Diagnosis Date  . Anemia 05/31/2014  . BV (bacterial vaginosis) 06/16/2015  . Encounter for smoking cessation counseling 06/16/2015  . HSV (herpes simplex virus) infection   . Hypertension   . Smoker 06/16/2015  . Umbilical hernia 06/16/2015  . Vaginal discharge 05/30/2014  . Vitamin D deficiency 06/19/2015  . Yeast infection 05/30/2014    Past Surgical History:  Procedure Laterality Date  . CESAREAN SECTION    . TUBAL LIGATION      Family History  Problem Relation Age of Onset  . Hypertension Mother   . Hypertension Brother   .  Sickle cell anemia Brother   . Kidney disease Father   . Alzheimer's disease Maternal Grandmother   . Cancer Maternal Grandfather   . Other Brother        born with fluid on brain  . Asthma Son      Social History   Socioeconomic History  . Marital status: Legally Separated    Spouse name: Not on file  . Number of children: Not on file  . Years of education: Not on file  . Highest education level: Not on file  Occupational History  . Not on file  Social Needs  . Financial resource strain: Not on file  . Food insecurity:    Worry: Not on file    Inability: Not on file  . Transportation needs:    Medical: Not on file    Non-medical: Not on file  Tobacco Use  . Smoking status: Current Every Day Smoker    Packs/day: 0.00    Years: 18.00    Pack years: 0.00    Types: Cigarettes  . Smokeless tobacco: Never Used  . Tobacco comment: smokes 4-5 cig daily  Substance and Sexual Activity  . Alcohol use: No  . Drug use: No  . Sexual activity: Yes    Birth control/protection:  Surgical    Comment: tubal  Lifestyle  . Physical activity:    Days per week: Not on file    Minutes per session: Not on file  . Stress: Not on file  Relationships  . Social connections:    Talks on phone: Not on file    Gets together: Not on file    Attends religious service: Not on file    Active member of club or organization: Not on file    Attends meetings of clubs or organizations: Not on file    Relationship status: Not on file  Other Topics Concern  . Not on file  Social History Narrative   Works at [employer].    Married, has 3 children, 17, 12, 6.     Review of Systems  Constitutional: Negative for activity change, appetite change and fever.  HENT: Negative for mouth sores, nosebleeds and voice change.   Eyes: Negative for visual disturbance.  Respiratory: Negative for cough, chest tightness and shortness of breath.   Cardiovascular: Negative for chest pain, palpitations and leg swelling.    Gastrointestinal: Negative for abdominal pain, anal bleeding, blood in stool, nausea and vomiting.  Genitourinary: Negative for dysuria, frequency, hematuria, menstrual problem, pelvic pain, urgency, vaginal bleeding, vaginal discharge and vaginal pain.  Skin: Negative for rash.  Neurological: Negative for dizziness, syncope and light-headedness.  Hematological: Negative for adenopathy.  Psychiatric/Behavioral: Negative for decreased concentration, dysphoric mood, hallucinations and sleep disturbance. The patient is not hyperactive.   Patient denies any cramping in legs or pain in legs with walking.  Reports that her symptoms of heaviness in the legs occur at the end of the day after she is been on her feet.   Objective:   BP 118/80 (BP Location: Left Arm, Patient Position: Sitting, Cuff Size: Normal)   Pulse 92   Temp 98.3 F (36.8 C) (Temporal)   Resp 14   Ht  (1.499 m)   Wt 140 lb 0.6 oz (63.5 kg)   SpO2 100%   BMI 28.28 kg/m   Physical Exam  Constitutional: She is oriented to person, place, and time. She appears well-developed and well-nourished. No distress.  HENT:  Head: Normocephalic and atraumatic.  Eyes: Pupils are equal, round, and reactive to light.  Neck: Normal range of motion. Neck supple. No thyromegaly present.  Cardiovascular: Normal rate, regular rhythm and normal heart sounds.  Pulses:      Dorsalis pedis pulses are 2+ on the right side, and 2+ on the left side.       Posterior tibial pulses are 2+ on the right side, and 2+ on the left side.  Pulmonary/Chest: Effort normal and breath sounds normal. No respiratory distress.  Feet:  Right Foot:  Skin Integrity: Negative for ulcer, blister, skin breakdown or erythema.  Left Foot:  Skin Integrity: Negative for ulcer, blister, skin breakdown or erythema.  Neurological: She is alert and oriented to person, place, and time. No cranial nerve deficit.  Skin: Skin is warm and dry.  Nursing note and vitals  reviewed. Multiple spider veins/superficial veins in lower extremities.  2+ dorsalis pedis pulses.  No skin lesions or skin breakdown on legs bilaterally.  No rashes on legs or ulcerations. Depression screen [facility] 2/9 09/16/2017 06/13/2017 08/12/2016  Decreased Interest 0 0 0  Down, Depressed, Hopeless 0 0 0  PHQ - 2 Score 0 0 0     Assessment and Plan  1. Varicose veins of both lower extremities, unspecified whether complicated Discussed superficial  veins and varicose veins today.  Discussed compression stockings and how these can help with symptoms and further development of varicosities.  Patient will try compression hose.  She also requests referral to vein specialist for evaluation.  Referral placed. - Compression stockings - Ambulatory referral to Vascular Surgery  2. Iron deficiency anemia due to chronic blood loss Patient will continue iron supplementation.  We did discuss iron supplementation and the correct way to take iron to get the most absorption.  She voiced understanding.  She will return to the clinic in 2 months for labs. - CBC with Differential/Platelet - Iron, TIBC and Ferritin Panel  3. Tobacco abuse The 5 A's Model for treating Tobacco Use and Dependence was used today. I have identified and documented tobacco use status for this patient. I have urged the patient to quit tobacco use. At this time, the patient is willing and ready to attempt to quit. We have discussed medication options to aid in smoking cessation including nicotine replacement therapy (NRT), zyban, and chantix. We have also discussed behavioral modifications, lifestyle changes, and patient support options to aid in tobacco cessation success. Patient was congratulated on their desire to make this positive change for their health. Patient instructed to keep their follow up to ensure success.  Patient's insurance will not pay for the Chantix.  She wishes to try the nicotine replacement.  We also discussed quit  lines, CDC phone apps, and other supportive techniques to help aid in smoking cessation.  She understands the reason she needs to quit smoking.  She voiced understanding. - nicotine (NICODERM CQ) 21 mg/24hr patch; Place 1 patch (21 mg total) onto the skin daily.  Dispense: 28 patch; Refill: 0  Discussed with patient routine skin care.  Recommended against using cocoa butter cream on her face.  Recommend Cetaphil soap and lotion.  Increase water. Office visit was 25 minutes.  Greater than 50% of office visit spent counseling and coordinating care.  Counseled on tobacco cessation, NicoDerm use, skin care, iron supplementation, varicose veins, use of compression stockings. She will follow-up for complete physical examination in 2 months. Return in about 2 months (around 11/16/2017) for CPE. Aliene Beams, MD 09/16/2017

## 2017-09-16 NOTE — Patient Instructions (Addendum)
Cetaphil soap/senstive skin Cervae or cetaphil face lotion Cervae am/pm Drink Water   Come back to the lab in 2 months and check blood counts/iron before your follow up Call in 1 month and if you have been using the nicoderm patches each day, we will decrease you to the 14 mg patches. You will start at the 21 mg patches  Nicotine skin patches What is this medicine? NICOTINE (NIK oh teen) helps people stop smoking. The patches replace the nicotine found in cigarettes and help to decrease withdrawal effects. They are most effective when used in combination with a stop-smoking program. This medicine may be used for other purposes; ask your health care provider or pharmacist if you have questions. COMMON BRAND NAME(S): Habitrol, Nicoderm CQ, Nicotrol What should I tell my health care provider before I take this medicine? They need to know if you have any of these conditions: -diabetes -heart disease, angina, irregular heartbeat or previous heart attack -high blood pressure -lung disease, including asthma -overactive thyroid -pheochromocytoma -seizures or a history of seizures -skin problems, like eczema -stomach problems or ulcers -an unusual or allergic reaction to nicotine, adhesives, other medicines, foods, dyes, or preservatives -pregnant or trying to get pregnant -breast-feeding How should I use this medicine? This medicine is for use on the skin. Follow the directions that come with the patches. Find an area of skin on your upper arm, chest, or back that is clean, dry, greaseless, undamaged and hairless. Wash hands with plain soap and water. Do not use anything that contains aloe, lanolin or glycerin as these may prevent the patch from sticking. Dry thoroughly. Remove the patch from the sealed pouch. Do not try to cut or trim the patch. Using your palm, press the patch firmly in place for 10 seconds to make sure that there is good contact with your skin. After applying the patch,  wash your hands. Change the patch every day, keeping to a regular schedule. When you apply a new patch, use a new area of skin. Wait at least 1 week before using the same area again. Talk to your pediatrician regarding the use of this medicine in children. Special care may be needed. Overdosage: If you think you have taken too much of this medicine contact a poison control center or emergency room at once. NOTE: This medicine is only for you. Do not share this medicine with others. What if I miss a dose? If you forget to replace a patch, use it as soon as you can. Only use one patch at a time and do not leave on the skin for longer than directed. If a patch falls off, you can replace it, but keep to your schedule and remove the patch at the right time. What may interact with this medicine? -medicines for asthma -medicines for blood pressure -medicines for mental depression This list may not describe all possible interactions. Give your health care provider a list of all the medicines, herbs, non-prescription drugs, or dietary supplements you use. Also tell them if you smoke, drink alcohol, or use illegal drugs. Some items may interact with your medicine. What should I watch for while using this medicine? You should begin using the nicotine patch the day you stop smoking. It is okay if you do not succeed at your attempt to quit and have a cigarette. You can still continue your quit attempt and keep using the product as directed. Just throw away your cigarettes and get back to your quit plan. You  can keep the patch in place during swimming, bathing, and showering. If your patch falls off during these activities, replace it. When you first apply the patch, your skin may itch or burn. This should go away soon. When you remove a patch, the skin may look red, but this should only last for a few days. Call your doctor or health care professional if skin redness does not go away after 4 days, if your skin  swells, or if you get a rash. If you are a diabetic and you quit smoking, the effects of insulin may be increased and you may need to reduce your insulin dose. Check with your doctor or health care professional about how you should adjust your insulin dose. If you are going to have a magnetic resonance imaging (MRI) procedure, tell your MRI technician if you have this patch on your body. It must be removed before a MRI. What side effects may I notice from receiving this medicine? Side effects that you should report to your doctor or health care professional as soon as possible: -allergic reactions like skin rash, itching or hives, swelling of the face, lips, or tongue -breathing problems -changes in hearing -changes in vision -chest pain -cold sweats -confusion -fast, irregular heartbeat -feeling faint or lightheaded, falls -headache -increased saliva -skin redness that lasts more than 4 days -stomach pain -signs and symptoms of nicotine overdose like nausea; vomiting; dizziness; weakness; and rapid heartbeat Side effects that usually do not require medical attention (report to your doctor or health care professional if they continue or are bothersome): -diarrhea -dry mouth -hiccups -irritability -nervousness or restlessness -trouble sleeping or vivid dreams This list may not describe all possible side effects. Call your doctor for medical advice about side effects. You may report side effects to FDA at 1-800-FDA-1088. Where should I keep my medicine? Keep out of the reach of children. Store at room temperature between 20 and 25 degrees C (68 and 77 degrees F). Protect from heat and light. Store in Tax inspector until ready to use. Throw away unused medicine after the expiration date. When you remove a patch, fold with sticky sides together; put in an empty opened pouch and throw away. NOTE: This sheet is a summary. It may not cover all possible information. If you have  questions about this medicine, talk to your doctor, pharmacist, or health care provider.  2018 Elsevier/Gold Standard (2014-03-07 15:46:21)  Steps to Quit Smoking Smoking tobacco can be bad for your health. It can also affect almost every organ in your body. Smoking puts you and people around you at risk for many serious long-lasting (chronic) diseases. Quitting smoking is hard, but it is one of the best things that you can do for your health. It is never too late to quit. What are the benefits of quitting smoking? When you quit smoking, you lower your risk for getting serious diseases and conditions. They can include:  Lung cancer or lung disease.  Heart disease.  Stroke.  Heart attack.  Not being able to have children (infertility).  Weak bones (osteoporosis) and broken bones (fractures).  If you have coughing, wheezing, and shortness of breath, those symptoms may get better when you quit. You may also get sick less often. If you are pregnant, quitting smoking can help to lower your chances of having a baby of low birth weight. What can I do to help me quit smoking? Talk with your doctor about what can help you quit smoking. Some things you  can do (strategies) include:  Quitting smoking totally, instead of slowly cutting back how much you smoke over a period of time.  Going to in-person counseling. You are more likely to quit if you go to many counseling sessions.  Using resources and support systems, such as: ? Agricultural engineer with a Veterinary surgeon. ? Phone quitlines. ? Automotive engineer. ? Support groups or group counseling. ? Text messaging programs. ? Mobile phone apps or applications.  Taking medicines. Some of these medicines may have nicotine in them. If you are pregnant or breastfeeding, do not take any medicines to quit smoking unless your doctor says it is okay. Talk with your doctor about counseling or other things that can help you.  Talk with your doctor about  using more than one strategy at the same time, such as taking medicines while you are also going to in-person counseling. This can help make quitting easier. What things can I do to make it easier to quit? Quitting smoking might feel very hard at first, but there is a lot that you can do to make it easier. Take these steps:  Talk to your family and friends. Ask them to support and encourage you.  Call phone quitlines, reach out to support groups, or work with a Veterinary surgeon.  Ask people who smoke to not smoke around you.  Avoid places that make you want (trigger) to smoke, such as: ? Bars. ? Parties. ? Smoke-break areas at work.  Spend time with people who do not smoke.  Lower the stress in your life. Stress can make you want to smoke. Try these things to help your stress: ? Getting regular exercise. ? Deep-breathing exercises. ? Yoga. ? Meditating. ? Doing a body scan. To do this, close your eyes, focus on one area of your body at a time from head to toe, and notice which parts of your body are tense. Try to relax the muscles in those areas.  Download or buy apps on your mobile phone or tablet that can help you stick to your quit plan. There are many free apps, such as QuitGuide from the [employer] developer for Disease Control and Prevention). You can find more support from smokefree.gov and other websites.  This information is not intended to replace advice given to you by your health care provider. Make sure you discuss any questions you have with your health care provider. Document Released: 02/02/2009 Document Revised: 12/05/2015 Document Reviewed: 08/23/2014 Elsevier Interactive Patient Education  2018 Elsevier Inc.  Varicose Veins Varicose veins are veins that have become enlarged and twisted. They are usually seen in the legs but can occur in other parts of the body as well. What are the causes? This condition is the result of valves in the veins not working properly. Valves in the  veins help to return blood from the leg to the heart. If these valves are damaged, blood flows backward and backs up into the veins in the leg near the skin. This causes the veins to become larger. What increases the risk? People who are on their feet a lot, who are pregnant, or who are overweight are more likely to develop varicose veins. What are the signs or symptoms?  Bulging, twisted-appearing, bluish veins, most commonly found on the legs.  Leg pain or a feeling of heaviness. These symptoms may be worse at the end of the day.  Leg swelling.  Changes in skin color. How is this diagnosed? A health care provider can usually diagnose varicose  veins by examining your legs. Your health care provider may also recommend an ultrasound of your leg veins. How is this treated? Most varicose veins can be treated at home.However, other treatments are available for people who have persistent symptoms or want to improve the cosmetic appearance of the varicose veins. These treatment options include:  Sclerotherapy. A solution is injected into the vein to close it off.  Laser treatment. A laser is used to heat the vein to close it off.  Radiofrequency vein ablation. An electrical current produced by radio waves is used to close off the vein.  Phlebectomy. The vein is surgically removed through small incisions made over the varicose vein.  Vein ligation and stripping. The vein is surgically removed through incisions made over the varicose vein after the vein has been tied (ligated).  Follow these instructions at home:  Do not stand or sit in one position for long periods of time. Do not sit with your legs crossed. Rest with your legs raised during the day.  Wear compression stockings as directed by your health care provider. These stockings help to prevent blood clots and reduce swelling in your legs.  Do not wear other tight, encircling garments around your legs, pelvis, or waist.  Walk as  much as possible to increase blood flow.  Raise the foot of your bed at night with 2-inch blocks.  If you get a cut in the skin over the vein and the vein bleeds, lie down with your leg raised and press on it with a clean cloth until the bleeding stops. Then place a bandage (dressing) on the cut. See your health care provider if it continues to bleed. Contact a health care provider if:  The skin around your ankle starts to break down.  You have pain, redness, tenderness, or hard swelling in your leg over a vein.  You are uncomfortable because of leg pain. This information is not intended to replace advice given to you by your health care provider. Make sure you discuss any questions you have with your health care provider. Document Released: 01/16/2005 Document Revised: 09/14/2015 Document Reviewed: 10/10/2015 Elsevier Interactive Patient Education  2017 [employer].

## 2017-09-26 ENCOUNTER — Encounter: Payer: Self-pay | Admitting: Family Medicine

## 2017-10-10 ENCOUNTER — Encounter: Payer: Self-pay | Admitting: Family Medicine

## 2017-10-17 ENCOUNTER — Encounter (HOSPITAL_COMMUNITY): Payer: Managed Care, Other (non HMO)

## 2017-10-17 ENCOUNTER — Encounter: Payer: Managed Care, Other (non HMO) | Admitting: Vascular Surgery

## 2017-10-20 ENCOUNTER — Other Ambulatory Visit: Payer: Self-pay | Admitting: Family Medicine

## 2017-10-20 DIAGNOSIS — Z720 Tobacco use: Secondary | ICD-10-CM

## 2017-11-20 ENCOUNTER — Encounter: Payer: Managed Care, Other (non HMO) | Admitting: Family Medicine

## 2017-12-09 ENCOUNTER — Ambulatory Visit (INDEPENDENT_AMBULATORY_CARE_PROVIDER_SITE_OTHER): Payer: Managed Care, Other (non HMO) | Admitting: Family Medicine

## 2017-12-09 ENCOUNTER — Encounter: Payer: Self-pay | Admitting: Family Medicine

## 2017-12-09 ENCOUNTER — Other Ambulatory Visit: Payer: Self-pay

## 2017-12-09 VITALS — BP 128/82 | HR 93 | Temp 97.9°F | Resp 14 | Ht 59.0 in | Wt 141.1 lb

## 2017-12-09 DIAGNOSIS — Z0000 Encounter for general adult medical examination without abnormal findings: Secondary | ICD-10-CM | POA: Diagnosis not present

## 2017-12-09 DIAGNOSIS — Z720 Tobacco use: Secondary | ICD-10-CM

## 2017-12-09 DIAGNOSIS — D649 Anemia, unspecified: Secondary | ICD-10-CM

## 2017-12-09 MED ORDER — BUPROPION HCL ER (XL) 150 MG PO TB24: ORAL_TABLET | ORAL | 0 refills | Status: DC

## 2017-12-09 NOTE — Progress Notes (Signed)
Patient ID: [name], female    DOB: April 10, 1976, 42 y.o.   MRN: [id number]  Chief Complaint  Patient presents with  . Annual Exam    Allergies Patient has no known allergies.  Subjective:   [name] is a 42 y.o. female who [facility].  HPI Here for CPE. Has been doing well but would like to get help with quitting smoking. Has tried the nicotine patches but they did not help. Cannot afford chantix. Would be interested in wellbutrin. No history of seizures. Smokes about 1 ppd for over 18 years.  Husband smokes too.  Taking iron pills qd. Had pap smear 2 years ago normal and was checked for HPV. Pap smear five years ago was normal and checked for HPV.  No history of STD per patient. Mammogram in 03/2017. Denies any vaginal discharge.  Energy and mood good. FH of HTN.    Past Medical History:  Diagnosis Date  . Anemia 05/31/2014  . BV (bacterial vaginosis) 06/16/2015  . Encounter for smoking cessation counseling 06/16/2015  . HSV (herpes simplex virus) infection   . Hypertension   . Smoker 06/16/2015  . Umbilical hernia 06/16/2015  . Vaginal discharge 05/30/2014  . Vitamin D deficiency 06/19/2015  . Yeast infection 05/30/2014    Past Surgical History:  Procedure Laterality Date  . CESAREAN SECTION    . TUBAL LIGATION      Family History  Problem Relation Age of Onset  . Hypertension Mother   . Hypertension Brother   . Sickle cell anemia Brother   . Kidney disease Father   . Alzheimer's disease Maternal Grandmother   . Cancer Maternal Grandfather   . Other Brother        born with fluid on brain  . Asthma Son      Social History   Socioeconomic History  . Marital status: Legally Separated    Spouse name: Not on file  . Number of children: Not on file  . Years of education: Not on file  . Highest education level: Not on file  Occupational History  . Not on file  Social Needs  . Financial resource strain: Not on file    . Food insecurity:    Worry: Not on file    Inability: Not on file  . Transportation needs:    Medical: Not on file    Non-medical: Not on file  Tobacco Use  . Smoking status: Current Every Day Smoker    Packs/day: 0.00    Years: 18.00    Pack years: 0.00    Types: Cigarettes  . Smokeless tobacco: Never Used  . Tobacco comment: 1 ppd  Substance and Sexual Activity  . Alcohol use: No  . Drug use: No  . Sexual activity: Yes    Partners: Male    Birth control/protection: Surgical    Comment: tubal  Lifestyle  . Physical activity:    Days per week: Not on file    Minutes per session: Not on file  . Stress: Not on file  Relationships  . Social connections:    Talks on phone: Not on file    Gets together: Not on file    Attends religious service: Not on file    Active member of club or organization: Not on file    Attends meetings of clubs or organizations: Not on file    Relationship status: Not on file  Other Topics Concern  . Not on file  Social History Narrative   Works at [employer].    Married, has 3 children, 17, 12, 6.    Eats all food groups.   Wears seatbelt.   Exercises at work by walking/cleaning.       Review of Systems  Constitutional: Negative for activity change, appetite change, fatigue and fever.  HENT: Negative for dental problem, mouth sores, postnasal drip, rhinorrhea, sinus pressure, sore throat, trouble swallowing and voice change.   Eyes: Negative for visual disturbance.  Respiratory: Negative for cough, chest tightness, shortness of breath and wheezing.   Cardiovascular: Negative for chest pain, palpitations and leg swelling.  Gastrointestinal: Negative for abdominal pain, constipation, diarrhea, nausea and vomiting.  Endocrine: Negative for polyphagia and polyuria.  Genitourinary: Negative for decreased urine volume, difficulty urinating, dyspareunia, dysuria, flank pain, frequency, genital sores, menstrual problem, pelvic pain, urgency, vaginal  bleeding, vaginal discharge and vaginal pain.       Menses monthly, last 5 days, pads. No STDs. No problems. No abnormal vaginal discharge.   Musculoskeletal: Negative for arthralgias and myalgias.  Skin: Negative for rash.  Neurological: Positive for seizures. Negative for dizziness, tremors, syncope, weakness, light-headedness and headaches.  Hematological: Negative for adenopathy.  Psychiatric/Behavioral: Negative for decreased concentration, dysphoric mood, sleep disturbance and suicidal ideas.     Objective:   BP 128/82 (BP Location: Left Arm, Patient Position: Sitting, Cuff Size: Large)   Pulse 93   Temp 97.9 F (36.6 C) (Temporal)   Resp 14   Ht 4\' 11"  (1.499 m)   Wt 141 lb 1.3 oz (64 kg)   SpO2 98% Comment: room air  BMI 28.49 kg/m   Physical Exam  Constitutional: She is oriented to person, place, and time. She appears well-developed and well-nourished. No distress.  HENT:  Head: Normocephalic and atraumatic.  Right Ear: External ear normal.  Left Ear: External ear normal.  Nose: Nose normal.  Mouth/Throat: Oropharynx is clear and moist. No oropharyngeal exudate.  Eyes: Pupils are equal, round, and reactive to light. Conjunctivae are normal. No scleral icterus.  Neck: Normal range of motion. Neck supple. No JVD present. No tracheal deviation present. No thyromegaly present.  Cardiovascular: Normal rate, regular rhythm, normal heart sounds and intact distal pulses.  Pulmonary/Chest: Effort normal. No respiratory distress. She has no wheezes. She exhibits no mass. Right breast exhibits no inverted nipple, no mass, no nipple discharge, no skin change and no tenderness. Left breast exhibits no inverted nipple, no mass, no nipple discharge, no skin change and no tenderness.  Abdominal: Soft. Bowel sounds are normal. She exhibits no distension and no mass. There is no tenderness. There is no guarding.  Musculoskeletal: Normal range of motion.  Lymphadenopathy:    She has no  cervical adenopathy.  Neurological: She is alert and oriented to person, place, and time. She displays normal reflexes. No cranial nerve deficit or sensory deficit. She exhibits normal muscle tone. Coordination normal.  Skin: Skin is warm and dry. Capillary refill takes less than 2 seconds. No erythema.  Psychiatric: She has a normal mood and affect. Her behavior is normal. Judgment and thought content normal.  Nursing note and vitals reviewed.    Assessment and Plan  1. Well adult exam Age appropriate anticipatory guidance given.  Health maintenance discussed. Defers testing for HIV/hepatitis/RPR. Counseling given. Check labs.  Pap smear due in 2020. CBE done today.  - Lipid panel - Urinalysis, Routine w reflex microscopic - Urine cytology ancillary only Lifestyle modifications discussed with patient including a diet emphasizing  vegetables, fruits, and whole grains. Limiting intake of sodium to less than 2,400 mg per day.  Recommendations discussed include consuming low-fat dairy products, poultry, fish, legumes, non-tropical vegetable oils, and nuts; and limiting intake of sweets, sugar-sweetened beverages, and red meat. Discussed following a plan such as the Dietary Approaches to Stop Hypertension (DASH) diet. Patient to read up on this diet.  Exercise recommended. Dental visit recommended q 6 months Vision exam yearly.  2. Anemia, unspecified type Continue iron pills. Check levels today.  - CBC with Differential/Platelet - Iron, TIBC and Ferritin Panel  3. Tobacco abuse The 5 A's Model for treating Tobacco Use and Dependence was used today. I have identified and documented tobacco use status for this patient. I have urged the patient to quit tobacco use. At this time, the patient is willing and ready to attempt to quit. We have discussed medication options to aid in smoking cessation including nicotine replacement therapy (NRT), zyban, and chantix. We have also discussed behavioral  modifications, lifestyle changes, and patient support options to aid in tobacco cessation success. Patient was congratulated on their desire to make this positive change for their health. Patient instructed to keep their follow up to ensure success.  We discussed wellbutrin for smoking cessation.  Patient told that upon starting medication that they should use caution when driving or operating machinery or engaging in hazardous activity until they know how this medication will affect them. Patient understands the risk of this medication and voiced understanding. Agrees to keep follow up appointments and follow the treatment plan.   - buPROPion (WELLBUTRIN XL) 150 MG 24 hr tablet; Take one pill a day for 7 days and then increase to 2 pills each day in the morning  Dispense: 49 tablet; Refill: 0  Return in about 4 weeks (around 01/06/2018) for follow up. Aliene Beamsachel Reverie Vaquera, MD 12/09/2017

## 2017-12-09 NOTE — Patient Instructions (Addendum)
Start the Wellbutrin XL 150 mg, one pill in the morning a for 7 days. Then  Increase the dose to Wellbutrin XL 150mg , 2 pills each morning.  You need to pick a quit DATE-usually about 10 days after starting the Wellbutrin XL.  Follow up in 1 month   Steps to Quit Smoking Smoking tobacco can be harmful to your health and can affect almost every organ in your body. Smoking puts you, and those around you, at risk for developing many serious chronic diseases. Quitting smoking is difficult, but it is one of the best things that you can do for your health. It is never too late to quit. What are the benefits of quitting smoking? When you quit smoking, you lower your risk of developing serious diseases and conditions, such as:  Lung cancer or lung disease, such as COPD.  Heart disease.  Stroke.  Heart attack.  Infertility.  Osteoporosis and bone fractures.  Additionally, symptoms such as coughing, wheezing, and shortness of breath may get better when you quit. You may also find that you get sick less often because your body is stronger at fighting off colds and infections. If you are pregnant, quitting smoking can help to reduce your chances of having a baby of low birth weight. How do I get ready to quit? When you decide to quit smoking, create a plan to make sure that you are successful. Before you quit:  Pick a date to quit. Set a date within the next two weeks to give you time to prepare.  Write down the reasons why you are quitting. Keep this list in places where you will see it often, such as on your bathroom mirror or in your car or wallet.  Identify the people, places, things, and activities that make you want to smoke (triggers) and avoid them. Make sure to take these actions: ? Throw away all cigarettes at home, at work, and in your car. ? Throw away smoking accessories, such as Set designerashtrays and lighters. ? Clean your car and make sure to empty the ashtray. ? Clean your home,  including curtains and carpets.  Tell your family, friends, and coworkers that you are quitting. Support from your loved ones can make quitting easier.  Talk with your health care provider about your options for quitting smoking.  Find out what treatment options are covered by your health insurance.  What strategies can I use to quit smoking? Talk with your healthcare provider about different strategies to quit smoking. Some strategies include:  Quitting smoking altogether instead of gradually lessening how much you smoke over a period of time. Research shows that quitting "cold [place]key" is more successful than gradually quitting.  Attending in-person counseling to help you build problem-solving skills. You are more likely to have success in quitting if you attend several counseling sessions. Even short sessions of 10 minutes can be effective.  Finding resources and support systems that can help you to quit smoking and remain smoke-free after you quit. These resources are most helpful when you use them often. They can include: ? Online chats with a Veterinary surgeoncounselor. ? Telephone quitlines. ? Automotive engineerrinted self-help materials. ? Support groups or group counseling. ? Text messaging programs. ? Mobile phone applications.  Taking medicines to help you quit smoking. (If you are pregnant or breastfeeding, talk with your health care provider first.) Some medicines contain nicotine and some do not. Both types of medicines help with cravings, but the medicines that include nicotine help to relieve withdrawal  symptoms. Your health care provider may recommend: ? Nicotine patches, gum, or lozenges. ? Nicotine inhalers or sprays. ? Non-nicotine medicine that is taken by mouth.  Talk with your health care provider about combining strategies, such as taking medicines while you are also receiving in-person counseling. Using these two strategies together makes you more likely to succeed in quitting than if you used  either strategy on its own. If you are pregnant or breastfeeding, talk with your health care provider about finding counseling or other support strategies to quit smoking. Do not take medicine to help you quit smoking unless told to do so by your health care provider. What things can I do to make it easier to quit? Quitting smoking might feel overwhelming at first, but there is a lot that you can do to make it easier. Take these important actions:  Reach out to your family and friends and ask that they support and encourage you during this time. Call telephone quitlines, reach out to support groups, or work with a counselor for support.  Ask people who smoke to avoid smoking around you.  Avoid places that trigger you to smoke, such as bars, parties, or smoke-break areas at work.  Spend time around people who do not smoke.  Lessen stress in your life, because stress can be a smoking trigger for some people. To lessen stress, try: ? Exercising regularly. ? Deep-breathing exercises. ? Yoga. ? Meditating. ? Performing a body scan. This involves closing your eyes, scanning your body from head to toe, and noticing which parts of your body are particularly tense. Purposefully relax the muscles in those areas.  Download or purchase mobile phone or tablet apps (applications) that can help you stick to your quit plan by providing reminders, tips, and encouragement. There are many free apps, such as QuitGuide from the [employer] developer(Centers for Disease Control and Prevention). You can find other support for quitting smoking (smoking cessation) through smokefree.gov and other websites.  How will I feel when I quit smoking? Within the first 24 hours of quitting smoking, you may start to feel some withdrawal symptoms. These symptoms are usually most noticeable 2-3 days after quitting, but they usually do not last beyond 2-3 weeks. Changes or symptoms that you might experience include:  Mood swings.  Restlessness,  anxiety, or irritation.  Difficulty concentrating.  Dizziness.  Strong cravings for sugary foods in addition to nicotine.  Mild weight gain.  Constipation.  Nausea.  Coughing or a sore throat.  Changes in how your medicines work in your body.  A depressed mood.  Difficulty sleeping (insomnia).  After the first 2-3 weeks of quitting, you may start to notice more positive results, such as:  Improved sense of smell and taste.  Decreased coughing and sore throat.  Slower heart rate.  Lower blood pressure.  Clearer skin.  The ability to breathe more easily.  Fewer sick days.  Quitting smoking is very challenging for most people. Do not get discouraged if you are not successful the first time. Some people need to make many attempts to quit before they achieve long-term success. Do your best to stick to your quit plan, and talk with your health care provider if you have any questions or concerns. This information is not intended to replace advice given to you by your health care provider. Make sure you discuss any questions you have with your health care provider. Document Released: 04/02/2001 Document Revised: 12/05/2015 Document Reviewed: 08/23/2014 Elsevier Interactive Patient Education  2018  [employer].

## 2017-12-10 LAB — CBC WITH DIFFERENTIAL/PLATELET
BASOS PCT: 0.2 %
Basophils Absolute: 10 cells/uL (ref 0–200)
Eosinophils Absolute: 40 cells/uL (ref 15–500)
Eosinophils Relative: 0.8 %
HCT: 39.9 % (ref 35.0–45.0)
Hemoglobin: 12.7 g/dL (ref 11.7–15.5)
Lymphs Abs: 2115 cells/uL (ref 850–3900)
MCH: 26.8 pg — ABNORMAL LOW (ref 27.0–33.0)
MCHC: 31.8 g/dL — ABNORMAL LOW (ref 32.0–36.0)
MCV: 84.4 fL (ref 80.0–100.0)
MONOS PCT: 8 %
MPV: 12.3 fL (ref 7.5–12.5)
Neutro Abs: 2435 cells/uL (ref 1500–7800)
Neutrophils Relative %: 48.7 %
PLATELETS: 190 10*3/uL (ref 140–400)
RBC: 4.73 10*6/uL (ref 3.80–5.10)
RDW: 13.5 % (ref 11.0–15.0)
TOTAL LYMPHOCYTE: 42.3 %
WBC: 5 10*3/uL (ref 3.8–10.8)
WBCMIX: 400 {cells}/uL (ref 200–950)

## 2017-12-10 LAB — URINALYSIS, ROUTINE W REFLEX MICROSCOPIC
BILIRUBIN URINE: NEGATIVE
Glucose, UA: NEGATIVE
HGB URINE DIPSTICK: NEGATIVE
Leukocytes, UA: NEGATIVE
Nitrite: NEGATIVE
PH: 5.5 (ref 5.0–8.0)
Protein, ur: NEGATIVE
Specific Gravity, Urine: 1.024 (ref 1.001–1.03)

## 2017-12-10 LAB — LIPID PANEL
CHOL/HDL RATIO: 3.2 (calc) (ref <5.0)
Cholesterol: 165 mg/dL (ref <200)
HDL: 51 mg/dL (ref >50)
LDL CHOLESTEROL (CALC): 100 mg/dL — AB
Non-HDL Cholesterol (Calc): 114 mg/dL (calc) (ref <130)
Triglycerides: 48 mg/dL (ref <150)

## 2017-12-10 LAB — IRON,TIBC AND FERRITIN PANEL
%SAT: 19 % (ref 16–45)
FERRITIN: 22 ng/mL (ref 16–154)
IRON: 80 ug/dL (ref 40–190)
TIBC: 415 mcg/dL (calc) (ref 250–450)

## 2018-01-06 ENCOUNTER — Ambulatory Visit (INDEPENDENT_AMBULATORY_CARE_PROVIDER_SITE_OTHER): Payer: Managed Care, Other (non HMO) | Admitting: Family Medicine

## 2018-01-06 ENCOUNTER — Encounter: Payer: Self-pay | Admitting: Family Medicine

## 2018-01-06 ENCOUNTER — Other Ambulatory Visit: Payer: Self-pay

## 2018-01-06 VITALS — BP 108/80 | HR 83 | Temp 98.4°F | Resp 12 | Ht 59.0 in | Wt 142.1 lb

## 2018-01-06 DIAGNOSIS — Z113 Encounter for screening for infections with a predominantly sexual mode of transmission: Secondary | ICD-10-CM

## 2018-01-06 DIAGNOSIS — D649 Anemia, unspecified: Secondary | ICD-10-CM | POA: Diagnosis not present

## 2018-01-06 DIAGNOSIS — Z720 Tobacco use: Secondary | ICD-10-CM

## 2018-01-06 NOTE — Patient Instructions (Addendum)
[facility]. [address]  Loratidine/Clartin 10 mg a day

## 2018-01-06 NOTE — Progress Notes (Signed)
Patient ID: [name], female    DOB: 11/15/1975, 42 y.o.   MRN: [id number]  Chief Complaint  [name] with  . lab results    Allergies Patient has no known allergies.  Subjective:   [name] is a 42 y.o. female who Cain to [facility] today.  HPI [name] for follow-up today.  She is here to go over her blood work.  She was also supposed to follow-up today to see how she was doing on the Wellbutrin.  She has not started the Wellbutrin yet.  This was prescribed for smoking cessation.  She is still smoking.  She reports she has not had the money yet to be able to pay for the Wellbutrin.  She is considering getting it in the next several weeks.  She reports that she has had some stress recently with her husband.  She reports that he has been using some drugs and using their finances to pay for the drug use.  She reports she is safe in her home.  She does not feel down, depressed, or hopeless.  She reports that he has not been physically or verbally abusive to her.  She is considering getting him into a doctor to get him some help.  She believes he would be willing to get some help.  She has been taking her iron pills twice a day.  She would like to discuss her anemia.  She does have heavy periods but they are not bothersome to her.  They are only heavy for the first couple days.  She does not eat a lot of iron-containing foods.  Her hemoglobin has improved since starting the iron pills.  She takes it twice a day.  She denies any constipation or side effects with the medication.  She denies any chest pain, shortness of breath, swelling in her extremities, abdominal pain, or palpitations. In review of her labs today, it is noted that her urine cytology for gonorrhea, chlamydia, and trichomonas was not sent last time.  She would be willing to leave that today.  She defers having any blood work done for HIV, syphilis, or hepatitis.   Past Medical  History:  Diagnosis Date  . Anemia 05/31/2014  . BV (bacterial vaginosis) 06/16/2015  . Encounter for smoking cessation counseling 06/16/2015  . HSV (herpes simplex virus) infection   . Hypertension   . Smoker 06/16/2015  . Umbilical hernia 06/16/2015  . Vaginal discharge 05/30/2014  . Vitamin D deficiency 06/19/2015  . Yeast infection 05/30/2014    Past Surgical History:  Procedure Laterality Date  . CESAREAN SECTION    . TUBAL LIGATION      Family History  Problem Relation Age of Onset  . Hypertension Mother   . Hypertension Brother   . Sickle cell anemia Brother   . Kidney disease Father   . Alzheimer's disease Maternal Grandmother   . Cancer Maternal Grandfather   . Other Brother        born with fluid on brain  . Asthma Son      Social History   Socioeconomic History  . Marital status: Legally Separated    Spouse name: Not on file  . Number of children: Not on file  . Years of education: Not on file  . Highest education level: Not on file  Occupational History  . Not on file  Social Needs  . Financial resource strain: Not on file  . Food insecurity:    Worry:  Not on file    Inability: Not on file  . Transportation needs:    Medical: Not on file    Non-medical: Not on file  Tobacco Use  . Smoking status: Current Every Day Smoker    Packs/day: 0.00    Years: 18.00    Pack years: 0.00    Types: Cigarettes  . Smokeless tobacco: Never Used  . Tobacco comment: 1 ppd  Substance and Sexual Activity  . Alcohol use: No  . Drug use: No  . Sexual activity: Yes    Partners: Male    Birth control/protection: Surgical    Comment: tubal  Lifestyle  . Physical activity:    Days per week: Not on file    Minutes per session: Not on file  . Stress: Not on file  Relationships  . Social connections:    Talks on phone: Not on file    Gets together: Not on file    Attends religious service: Not on file    Active member of club or organization: Not on file    Attends  meetings of clubs or organizations: Not on file    Relationship status: Not on file  Other Topics Concern  . Not on file  Social History Narrative   Works at [employer].    Married, has 3 children, 17, 12, 6.    Eats all food groups.   Wears seatbelt.   Exercises at work by walking/cleaning.       Review of Systems  Constitutional: Negative for activity change, appetite change and fever.  Eyes: Negative for visual disturbance.  Respiratory: Negative for cough, chest tightness and shortness of breath.   Cardiovascular: Negative for chest pain, palpitations and leg swelling.  Gastrointestinal: Negative for abdominal pain, nausea and vomiting.  Genitourinary: Negative for dysuria, frequency and urgency.  Neurological: Negative for dizziness, seizures, syncope and light-headedness.  Hematological: Negative for adenopathy.     Objective:   BP 108/80 (BP Location: Left Arm, Patient Position: Sitting, Cuff Size: Normal)   Pulse 83   Temp 98.4 F (36.9 C) (Temporal)   Resp 12   Ht 4\' 11"  (1.499 m)   Wt 142 lb 1.3 oz (64.4 kg)   SpO2 100% Comment: room air  BMI 28.70 kg/m   Physical Exam  Constitutional: She is oriented to person, place, and time. She appears well-developed and well-nourished. No distress.  HENT:  Head: Normocephalic and atraumatic.  Eyes: Pupils are equal, round, and reactive to light. Conjunctivae are normal.  Cardiovascular: Normal rate, regular rhythm and normal heart sounds.  Pulmonary/Chest: Effort normal and breath sounds normal. No respiratory distress. She has no wheezes. She has no rales.  Neurological: She is alert and oriented to person, place, and time. No cranial nerve deficit.  Skin: Skin is warm and dry.  Psychiatric: She has a normal mood and affect. Her behavior is normal. Judgment and thought content normal.  Nursing note and vitals reviewed.   Dep[facility] screen [facility] 01/06/2018 12/09/2017 09/16/2017 06/13/2017 08/12/2016  Decreased Interest 0 0 0  0 0  Down, Depressed, Hopeless 1 0 0 0 0  PHQ - 2 Score 1 0 0 0 0    Assessment and Plan  1. Anemia, unspecified type Improving. Continue iron po bid for six months and recheck.   2. Screen for STD (sexually transmitted disease) Not performed last visit. Patient agreeable to leaving today. Defers any blood HIV or hepatitis/RPR testing.  - Urine cytology ancillary only Safe sex was  discussed with patient.  Did discuss with patient that her husband is displaying high risk behavior regarding drug use and this can often be seen with high risk sexual behavior.  She defers having any labs drawn today for screening of sexually transmitted infection. 3. Tobacco abuse Patient has not yet tried the Wellbutrin. Is going to get the script soon per her report. The 5 A's Model for treating Tobacco Use and Dependence was used today. I have identified and documented tobacco use status for this patient. I have urged the patient to quit tobacco use. At this time, the patient is willing and ready to attempt to quit. We have discussed medication options to aid in smoking cessation including nicotine replacement therapy (NRT), zyban, and chantix. We have also discussed behavioral modifications, lifestyle changes, and patient support options to aid in tobacco cessation success. Patient was congratulated on their desire to make this positive change for their health. Patient instructed to keep their follow up to ensure success.  Patient counseled in detail regarding the risks of medication. Told to call or return to clinic if develop any worrisome signs or symptoms. Patient voiced understanding.   Patient was given information regarding Narcotics Anonymous and ways to seek help for her husband.  She is going to consider getting him an appointment with a physician regarding his drug use. No follow-ups on file. Aliene Beams, MD 01/06/2018

## 2018-04-06 ENCOUNTER — Other Ambulatory Visit: Payer: Self-pay

## 2018-04-06 DIAGNOSIS — I83893 Varicose veins of bilateral lower extremities with other complications: Secondary | ICD-10-CM

## 2018-04-28 ENCOUNTER — Encounter: Payer: Managed Care, Other (non HMO) | Admitting: Vascular Surgery

## 2018-04-28 ENCOUNTER — Encounter (HOSPITAL_COMMUNITY): Payer: Managed Care, Other (non HMO)

## 2018-06-19 ENCOUNTER — Other Ambulatory Visit (HOSPITAL_COMMUNITY): Payer: Self-pay | Admitting: Obstetrics and Gynecology

## 2018-06-19 DIAGNOSIS — Z1231 Encounter for screening mammogram for malignant neoplasm of breast: Secondary | ICD-10-CM

## 2018-07-02 ENCOUNTER — Other Ambulatory Visit: Payer: Self-pay

## 2018-07-02 ENCOUNTER — Ambulatory Visit (HOSPITAL_COMMUNITY)
Admission: RE | Admit: 2018-07-02 | Discharge: 2018-07-02 | Disposition: A | Discharge status: Home / Self Care | Payer: Managed Care, Other (non HMO) | Source: Ambulatory Visit | Attending: Obstetrics and Gynecology | Admitting: Obstetrics and Gynecology

## 2018-07-02 DIAGNOSIS — Z1231 Encounter for screening mammogram for malignant neoplasm of breast: Secondary | ICD-10-CM | POA: Diagnosis not present

## 2018-07-03 ENCOUNTER — Other Ambulatory Visit (HOSPITAL_COMMUNITY): Payer: Self-pay | Admitting: Obstetrics and Gynecology

## 2018-07-03 DIAGNOSIS — R928 Other abnormal and inconclusive findings on diagnostic imaging of breast: Secondary | ICD-10-CM

## 2018-07-21 ENCOUNTER — Ambulatory Visit (HOSPITAL_COMMUNITY)
Admission: RE | Admit: 2018-07-21 | Discharge: 2018-07-21 | Disposition: A | Discharge status: Home / Self Care | Payer: Managed Care, Other (non HMO) | Source: Ambulatory Visit | Attending: Obstetrics and Gynecology | Admitting: Obstetrics and Gynecology

## 2018-07-21 ENCOUNTER — Other Ambulatory Visit: Payer: Self-pay

## 2018-07-21 DIAGNOSIS — R928 Other abnormal and inconclusive findings on diagnostic imaging of breast: Secondary | ICD-10-CM | POA: Diagnosis present

## 2018-08-04 ENCOUNTER — Encounter (HOSPITAL_COMMUNITY): Payer: Managed Care, Other (non HMO)

## 2018-08-04 ENCOUNTER — Encounter: Payer: Managed Care, Other (non HMO) | Admitting: Vascular Surgery

## 2018-11-24 ENCOUNTER — Encounter (HOSPITAL_COMMUNITY): Payer: Managed Care, Other (non HMO)

## 2018-11-24 ENCOUNTER — Encounter: Payer: Managed Care, Other (non HMO) | Admitting: Vascular Surgery

## 2018-12-10 ENCOUNTER — Other Ambulatory Visit: Payer: Self-pay

## 2018-12-10 DIAGNOSIS — Z20822 Contact with and (suspected) exposure to covid-19: Secondary | ICD-10-CM

## 2018-12-11 LAB — NOVEL CORONAVIRUS, NAA: SARS-CoV-2, NAA: NOT DETECTED

## 2018-12-14 ENCOUNTER — Telehealth: Payer: Self-pay | Admitting: General Practice

## 2018-12-14 NOTE — Telephone Encounter (Signed)
Pt aware covid lab test negative, not detected °

## 2018-12-29 ENCOUNTER — Encounter: Payer: Managed Care, Other (non HMO) | Admitting: Vascular Surgery

## 2018-12-29 ENCOUNTER — Encounter (HOSPITAL_COMMUNITY): Payer: Managed Care, Other (non HMO)

## 2018-12-31 ENCOUNTER — Encounter: Payer: Self-pay | Admitting: Family

## 2019-02-23 ENCOUNTER — Other Ambulatory Visit: Payer: Managed Care, Other (non HMO) | Admitting: Adult Health

## 2019-04-20 ENCOUNTER — Encounter: Payer: Self-pay | Admitting: Adult Health

## 2019-04-20 ENCOUNTER — Other Ambulatory Visit: Payer: Self-pay

## 2019-04-20 ENCOUNTER — Ambulatory Visit (INDEPENDENT_AMBULATORY_CARE_PROVIDER_SITE_OTHER): Payer: Self-pay | Admitting: Adult Health

## 2019-04-20 VITALS — BP 121/80 | HR 92 | Ht 59.0 in | Wt 146.5 lb

## 2019-04-20 DIAGNOSIS — Z3202 Encounter for pregnancy test, result negative: Secondary | ICD-10-CM | POA: Insufficient documentation

## 2019-04-20 DIAGNOSIS — N939 Abnormal uterine and vaginal bleeding, unspecified: Secondary | ICD-10-CM | POA: Insufficient documentation

## 2019-04-20 LAB — POCT URINE PREGNANCY: Preg Test, Ur: NEGATIVE

## 2019-04-20 LAB — POCT HEMOGLOBIN: Hemoglobin: 12.1 g/dL (ref 11–14.6)

## 2019-04-20 NOTE — Progress Notes (Signed)
Patient ID: [name], female   DOB: January 04, 1976, 43 y.o.   MRN: [id number] History of Present Illness: [name] is a 43 year old black female, [id number] referred by [facility] for bleeding.She was on micronor and continued to bled and then started megace and it has helped.  She had a normal pap at clinic TSH was normal and HGB was 11.6.    Current Medications, Allergies, Past Medical History, Past Surgical History, Family History and Social History were reviewed in [employer] record.     Review of Systems: Has had bleeding on and off was on OCs, and switched to megace and it has slowed way down No pain But if lifts heavy objects, will bled heavier and have had clots Denies any headaches, dizziness or shortness of breath.    Physical Exam:BP 121/80 (BP Location: Left Arm, Patient Position: Sitting, Cuff Size: Normal)   Pulse 92   Ht 4\' 11"  (1.499 m)   Wt 146 lb 8 oz (66.5 kg)   LMP 03/04/2019 (Approximate)   BMI 29.59 kg/m   UPT is negative HGB 12.1. General:  Well developed, well nourished, no acute distress Skin:  Warm and dry Neck:  Midline trachea, normal thyroid, good ROM, no lymphadenopathy Lungs; Clear to auscultation bilaterally Cardiovascular: Regular rate and rhythm Pelvic:  External genitalia is normal in appearance, no lesions.  The vagina is normal in appearance.No bleeding today. Urethra has no lesions or masses. The cervix is bulbous.  Uterus is felt to be normal size, shape, and contour.  No adnexal masses or tenderness noted.Bladder is non tender, no masses felt. Extremities/musculoskeletal:  No swelling or varicosities noted, no clubbing or cyanosis Psych:  No mood changes, alert and cooperative,seems happy Fall risk is low Examination chaperoned by Levy Pupa LPN   Impression and Plan: 1. Pregnancy examination or test, negative result   2. Abnormal vaginal bleeding Will get GYN [place] at [facility] 04/28/19 at 10:30 am Can decrease  megace to 1 daily Will talk when [place] results back

## 2019-04-20 NOTE — Patient Instructions (Signed)
Abnormal Uterine Bleeding °Abnormal uterine bleeding means bleeding more than usual from your uterus. It can include: °· Bleeding between periods. °· Bleeding after sex. °· Bleeding that is heavier than normal. °· Periods that last longer than usual. °· Bleeding after you have stopped having your period (menopause). °There are many problems that may cause this. You should see a doctor for any kind of bleeding that is not normal. Treatment depends on the cause of the bleeding. °Follow these instructions at home: °· Watch your condition for any changes. °· Do not use tampons, douche, or have sex, if your doctor tells you not to. °· Change your pads often. °· Get regular well-woman exams. Make sure they include a pelvic exam and cervical cancer screening. °· Keep all follow-up visits as told by your doctor. This is important. °Contact a doctor if: °· The bleeding lasts more than one week. °· You feel dizzy at times. °· You feel like you are going to throw up (nauseous). °· You throw up. °Get help right away if: °· You pass out. °· You have to change pads every hour. °· You have belly (abdominal) pain. °· You have a fever. °· You get sweaty. °· You get weak. °· You passing large blood clots from your vagina. °Summary °· Abnormal uterine bleeding means bleeding more than usual from your uterus. °· There are many problems that may cause this. You should see a doctor for any kind of bleeding that is not normal. °· Treatment depends on the cause of the bleeding. °This information is not intended to replace advice given to you by your health care provider. Make sure you discuss any questions you have with your health care provider. °Document Released: 02/03/2009 Document Revised: 04/02/2016 Document Reviewed: 04/02/2016 °Elsevier Patient Education © 2020 [employer] Inc. ° °

## 2019-04-28 ENCOUNTER — Other Ambulatory Visit: Payer: Self-pay

## 2019-04-28 ENCOUNTER — Ambulatory Visit (HOSPITAL_COMMUNITY)
Admission: RE | Admit: 2019-04-28 | Discharge: 2019-04-28 | Disposition: A | Discharge status: Home / Self Care | Payer: Self-pay | Source: Ambulatory Visit | Attending: Adult Health | Admitting: Adult Health

## 2019-04-28 DIAGNOSIS — N939 Abnormal uterine and vaginal bleeding, unspecified: Secondary | ICD-10-CM | POA: Insufficient documentation

## 2019-04-29 ENCOUNTER — Telehealth: Payer: Self-pay | Admitting: *Deleted

## 2019-04-29 NOTE — Telephone Encounter (Signed)
Pt requesting U/S results

## 2019-04-29 NOTE — Telephone Encounter (Signed)
Says bleeding is much less, and is aware that US showed small fibroid and adenomyosis. Keep taking megace and make appt to see Dr Emelda Fear for long term control

## 2019-05-06 ENCOUNTER — Ambulatory Visit: Payer: Self-pay | Admitting: Obstetrics and Gynecology

## 2019-05-12 ENCOUNTER — Ambulatory Visit: Payer: Self-pay | Attending: Internal Medicine

## 2019-05-12 ENCOUNTER — Other Ambulatory Visit: Payer: Self-pay

## 2019-05-12 DIAGNOSIS — Z20822 Contact with and (suspected) exposure to covid-19: Secondary | ICD-10-CM | POA: Insufficient documentation

## 2019-05-13 LAB — NOVEL CORONAVIRUS, NAA: SARS-CoV-2, NAA: NOT DETECTED

## 2019-05-18 ENCOUNTER — Other Ambulatory Visit: Payer: Self-pay

## 2019-05-18 ENCOUNTER — Encounter: Payer: Self-pay | Admitting: Obstetrics and Gynecology

## 2019-05-18 ENCOUNTER — Ambulatory Visit (INDEPENDENT_AMBULATORY_CARE_PROVIDER_SITE_OTHER): Payer: Self-pay | Admitting: Obstetrics and Gynecology

## 2019-05-18 VITALS — BP 128/84 | HR 80 | Ht 59.0 in | Wt 145.8 lb

## 2019-05-18 DIAGNOSIS — N939 Abnormal uterine and vaginal bleeding, unspecified: Secondary | ICD-10-CM

## 2019-05-18 NOTE — Progress Notes (Addendum)
Patient ID: [name], female   DOB: [birth year], 44 y.o.   MRN: [id number]    [facility] Visit  @DATE @            Patient name: [name] MRN [id number]  Date of birth: 1975/08/08  CC & HPI:  [name] is a 44 y.o. female presenting today for discussion of uterine fibroids on recent u/s on 04/28/2019. She was having heavy bleeding with clots. Was on micronor with no helped and switched to megace. She was seen by Derrek Monaco, NP. Her megace was reduced to 1 pill. She normally doesn't have pains with her periods. Last night she had cramping and aching in her legs. She stopped taking megace last week, she has had some spotting this morning.   ROS:  ROS TV/TA U/S on 04/28/2019 FINDINGS: Uterus Measurements: 9.9 x 5.6 x 6.7 cm = volume: 195 mL. 13 mm anterior right fundal fibroid, intramural. Diffusely heterogeneous echotexture.  Endometrium Thickness: 4 mm in thickness.  No focal abnormality visualized.  Right ovary Measurements: 2.4 x 1.7 x 2.0 cm = volume: 4.1 mL. Normal appearance/no adnexal mass.  Left ovary Measurements: 2.4 x 1.3 x 1.4 cm = volume: 2.2 mL. Normal appearance/no adnexal mass.  Other findings. No abnormal free fluid.  IMPRESSION: Small right fundal intramural fibroid.  Diffusely heterogeneous echotexture throughout the uterus can be seen with adenomyosis.  No acute findings.  Pertinent History Reviewed:   Reviewed: Significant for tubal ligation and C-section Medical         Past Medical History:  Diagnosis Date  . Anemia 05/31/2014  . BV (bacterial vaginosis) 06/16/2015  . Encounter for smoking cessation counseling 06/16/2015  . HSV (herpes simplex virus) infection   . Hypertension   . Smoker 06/16/2015  . Umbilical hernia 7/67/3419  . Vaginal discharge 05/30/2014  . Vitamin D deficiency 06/19/2015  . Yeast infection 05/30/2014                              Surgical Hx:    Past Surgical History:  Procedure  Laterality Date  . CESAREAN SECTION    . TUBAL LIGATION     Medications: Reviewed & Updated - see associated section                       Current Outpatient Medications:  .  Ascorbic Acid (VITAMIN C PO), Take 1 tablet by mouth daily., Disp: , Rfl:  .  Cholecalciferol 5000 units capsule, Take 1 capsule (5,000 Units total) by mouth daily., Disp: , Rfl:  .  ferrous sulfate 325 (65 FE) MG tablet, Take 1 tablet (325 mg total) by mouth 2 (two) times daily with a meal. (Patient taking differently: Take 325 mg by mouth 3 (three) times daily with meals. ), Disp: 60 tablet, Rfl: 3 .  ibuprofen (ADVIL,MOTRIN) 200 MG tablet, Take 800 mg by mouth every 6 (six) hours as needed for moderate pain., Disp: , Rfl:  .  naproxen (NAPROSYN) 500 MG tablet, Take 1 tablet (500 mg total) by mouth 2 (two) times daily. (Patient taking differently: Take 500 mg by mouth as needed. ), Disp: 30 tablet, Rfl: 0 .  megestrol (MEGACE) 40 MG tablet, Take 40 mg by mouth daily. Takes 3 tabs once a day, Disp: , Rfl:    Social History: Reviewed -  reports that she has been smoking cigarettes. She has been smoking about 0.00 packs  per day for the past 18.00 years. She has never used smokeless tobacco.  Objective Findings:  Vitals: Blood pressure 128/84, pulse 80, height 4\' 11"  (1.499 m), weight 145 lb 12.8 oz (66.1 kg).  PHYSICAL EXAMINATION General appearance - alert, well appearing, and in no distress Mental status - alert, oriented to person, place, and time, normal mood, behavior, speech, dress, motor activity, and thought processes, affect appropriate to mood  PELVIC VAGINA: light menstrual bleeding, no clots CERVIX: normal appearing cervix without discharge or lesions, UTERUS: enlarged to 6-8 week's size, good support  Assessment & Plan:   A:  1. Uterine fibroids w/ heavy menses  P:  1.  options discussed, OCP vs IUD vs ablation(less recommended due to question of adenomyosis on u/s) , will insert IUD at next  visit.    By signing my name below, I, , attest that this documentation has been prepared under the direction and in the presence of Arnette Norris, MD. Electronically Signed: Tilda Burrow Medical Scribe. 05/18/19. 9:35 AM.  I personally performed the services described in this documentation, which was SCRIBED in my presence. The recorded information has been reviewed and considered accurate. It has been edited as necessary during review. 05/20/19, MD  The provider spent over 25 minutes with the visit , including previsit review, and documentation,with >than 50% spent in counseling and coordination of care.

## 2019-06-02 ENCOUNTER — Other Ambulatory Visit: Payer: Self-pay

## 2019-06-02 ENCOUNTER — Encounter: Payer: Self-pay | Admitting: Obstetrics and Gynecology

## 2019-06-02 ENCOUNTER — Ambulatory Visit (INDEPENDENT_AMBULATORY_CARE_PROVIDER_SITE_OTHER): Payer: Self-pay | Admitting: Obstetrics and Gynecology

## 2019-06-02 VITALS — BP 135/87 | HR 92 | Ht 59.0 in | Wt 146.2 lb

## 2019-06-02 DIAGNOSIS — Z3202 Encounter for pregnancy test, result negative: Secondary | ICD-10-CM

## 2019-06-02 DIAGNOSIS — Z30430 Encounter for insertion of intrauterine contraceptive device: Secondary | ICD-10-CM

## 2019-06-02 LAB — POCT URINE PREGNANCY: Preg Test, Ur: NEGATIVE

## 2019-06-02 MED ORDER — LEVONORGESTREL 20 MCG/24HR IU IUD: INTRAUTERINE_SYSTEM | Freq: Once | INTRAUTERINE | Status: AC

## 2019-06-02 NOTE — Progress Notes (Addendum)
Patient ID: [name], female   DOB: [birth year], 44 y.o.   MRN: [id number]   GYNECOLOGY CLINIC PROCEDURE NOTE  Khristian Seals is a 44 y.o. [phone] here for Mirena IUD insertion.  No GYN concerns. She just finished spotting 2 days ago. She is no longer taking megace. Last pap smear was on 06/16/2015 and was normal.  IUD Insertion Procedure Note Patient identified, informed consent performed, consent signed.   Discussed risks of irregular bleeding, cramping, infection, malpositioning or misplacement of the IUD outside the uterus which may require further procedure such as laparoscopy. Time out was performed.  Urine pregnancy test negative.  Speculum placed in the vagina.  Cervix visualized. Clear cervical mucous seen/  Cleaned with Betadine x 2.  Grasped anteriorly with a single tooth tenaculum.   Uterus sounded to 10 cm.   IUD placed per manufacturer's recommendations.  Strings trimmed to 3 cm. Tenaculum was removed, good hemostasis noted.  Patient tolerated procedure well.  Transvaginal Ultrasound was not used to confirm IUD position  Patient was given post-procedure instructions.  She was advised to have backup contraception for one week.  Patient was also asked to check IUD strings periodically.  Follow up in 4 weeks for IUD check.  By signing my name below, I, Arnette Norris, attest that this documentation has been prepared under the direction and in the presence of Tilda Burrow, MD. Electronically Signed: Arnette Norris Medical Scribe. 06/02/19. 9:22 AM.  I personally performed the services described in this documentation, which was SCRIBED in my presence. The recorded information has been reviewed and considered accurate. It has been edited as necessary during review. Tilda Burrow, MD

## 2019-08-03 ENCOUNTER — Other Ambulatory Visit (HOSPITAL_COMMUNITY): Payer: Self-pay | Admitting: *Deleted

## 2019-08-03 DIAGNOSIS — Z1231 Encounter for screening mammogram for malignant neoplasm of breast: Secondary | ICD-10-CM

## 2019-08-16 ENCOUNTER — Encounter (HOSPITAL_COMMUNITY): Payer: Self-pay

## 2019-08-19 ENCOUNTER — Other Ambulatory Visit: Payer: Self-pay

## 2019-08-19 ENCOUNTER — Ambulatory Visit (HOSPITAL_COMMUNITY)
Admission: RE | Admit: 2019-08-19 | Discharge: 2019-08-19 | Disposition: A | Discharge status: Home / Self Care | Payer: PRIVATE HEALTH INSURANCE | Source: Ambulatory Visit | Attending: *Deleted | Admitting: *Deleted

## 2019-08-19 DIAGNOSIS — Z1231 Encounter for screening mammogram for malignant neoplasm of breast: Secondary | ICD-10-CM | POA: Diagnosis not present

## 2019-08-24 ENCOUNTER — Other Ambulatory Visit (HOSPITAL_COMMUNITY): Payer: Self-pay | Admitting: *Deleted

## 2019-08-24 DIAGNOSIS — N63 Unspecified lump in unspecified breast: Secondary | ICD-10-CM

## 2019-09-07 ENCOUNTER — Other Ambulatory Visit: Payer: Self-pay

## 2019-09-07 ENCOUNTER — Ambulatory Visit (HOSPITAL_COMMUNITY)
Admission: RE | Admit: 2019-09-07 | Discharge: 2019-09-07 | Disposition: A | Discharge status: Home / Self Care | Payer: PRIVATE HEALTH INSURANCE | Source: Ambulatory Visit | Attending: *Deleted | Admitting: *Deleted

## 2019-09-07 DIAGNOSIS — N63 Unspecified lump in unspecified breast: Secondary | ICD-10-CM | POA: Diagnosis not present

## 2019-10-02 ENCOUNTER — Other Ambulatory Visit: Payer: Self-pay

## 2019-10-02 ENCOUNTER — Encounter (HOSPITAL_COMMUNITY): Payer: Self-pay | Admitting: Emergency Medicine

## 2019-10-02 ENCOUNTER — Emergency Department (HOSPITAL_COMMUNITY): Payer: Self-pay

## 2019-10-02 ENCOUNTER — Emergency Department (HOSPITAL_COMMUNITY)
Admission: EM | Admit: 2019-10-02 | Discharge: 2019-10-02 | Disposition: A | Discharge status: Home / Self Care | Payer: Self-pay | Attending: Emergency Medicine | Admitting: Emergency Medicine

## 2019-10-02 DIAGNOSIS — R42 Dizziness and giddiness: Secondary | ICD-10-CM

## 2019-10-02 DIAGNOSIS — I10 Essential (primary) hypertension: Secondary | ICD-10-CM | POA: Insufficient documentation

## 2019-10-02 DIAGNOSIS — R0602 Shortness of breath: Secondary | ICD-10-CM | POA: Insufficient documentation

## 2019-10-02 DIAGNOSIS — F17210 Nicotine dependence, cigarettes, uncomplicated: Secondary | ICD-10-CM | POA: Insufficient documentation

## 2019-10-02 LAB — COMPREHENSIVE METABOLIC PANEL
ALT: 18 U/L (ref 0–44)
AST: 19 U/L (ref 15–41)
Albumin: 4.2 g/dL (ref 3.5–5.0)
Alkaline Phosphatase: 41 U/L (ref 38–126)
Anion gap: 10 (ref 5–15)
BUN: 14 mg/dL (ref 6–20)
CO2: 26 mmol/L (ref 22–32)
Calcium: 9.1 mg/dL (ref 8.9–10.3)
Chloride: 103 mmol/L (ref 98–111)
Creatinine, Ser: 0.75 mg/dL (ref 0.44–1.00)
GFR calc Af Amer: >60 mL/min (ref >60)
GFR calc non Af Amer: >60 mL/min (ref >60)
Glucose, Bld: 91 mg/dL (ref 70–99)
Potassium: 4 mmol/L (ref 3.5–5.1)
Sodium: 139 mmol/L (ref 135–145)
Total Bilirubin: 0.4 mg/dL (ref 0.3–1.2)
Total Protein: 7.4 g/dL (ref 6.5–8.1)

## 2019-10-02 LAB — CBC
HCT: 41.5 % (ref 36.0–46.0)
Hemoglobin: 13 g/dL (ref 12.0–15.0)
MCH: 27.4 pg (ref 26.0–34.0)
MCHC: 31.3 g/dL (ref 30.0–36.0)
MCV: 87.4 fL (ref 80.0–100.0)
Platelets: 232 10*3/uL (ref 150–400)
RBC: 4.75 MIL/uL (ref 3.87–5.11)
RDW: 14.9 % (ref 11.5–15.5)
WBC: 5.9 10*3/uL (ref 4.0–10.5)
nRBC: 0 % (ref 0.0–0.2)

## 2019-10-02 LAB — D-DIMER, QUANTITATIVE: D-Dimer, Quant: 1.11 ug/mL-FEU — ABNORMAL HIGH (ref 0.00–0.50)

## 2019-10-02 MED ORDER — IOHEXOL 350 MG/ML SOLN
100.0000 mL | Freq: Once | INTRAVENOUS | Status: AC | PRN
  Administered 2019-10-02: 100 mL via INTRAVENOUS

## 2019-10-02 MED ORDER — MECLIZINE HCL 12.5 MG PO TABS
12.5000 mg | ORAL_TABLET | Freq: Once | ORAL | Status: AC
  Administered 2019-10-02: 12.5 mg via ORAL
  Filled 2019-10-02: qty 1

## 2019-10-02 NOTE — Discharge Instructions (Addendum)
You were evaluated in the Emergency Department and after careful evaluation, we did not find any emergent condition requiring admission or further testing in the hospital.  Your exam/testing today was overall reassuring.  CAT scan did not show any blood clots.  As discussed, drink more water and eat more often throughout the day.  Please return to the Emergency Department if you experience any worsening of your condition.  We encourage you to follow up with a primary care provider.  Thank you for allowing [place] to be a part of your care.

## 2019-10-02 NOTE — ED Provider Notes (Signed)
[facility]-EMERGENCY DEPT [facility] Emergency Department Provider Note MRN:  [id number]  Arrival date & time: 10/02/19     Chief Complaint   Dizziness   History of Present Illness   [name] is a 44 y.o. year-old female with a history of hypertension presenting to the ED with chief complaint of dizziness.  2 weeks of persistent lightheadedness when ambulating, also experiencing a short of breath sensation in the center of her chest.  Denies chest pain.  This is not happened to her before.  She was wondering if her symptoms were related to anxiety but she denies any significant life stressors.  Denies cough or fever, no abdominal pain, no numbness or weakness to the arms or legs.  No vomiting or diarrhea.  Symptoms are mild to moderate, constant, increasing in frequency.  Review of Systems  A complete 10 system review of systems was obtained and all systems are negative except as noted in the HPI and PMH.   Patient's Health History    Past Medical History:  Diagnosis Date  . Anemia 05/31/2014  . BV (bacterial vaginosis) 06/16/2015  . Encounter for smoking cessation counseling 06/16/2015  . HSV (herpes simplex virus) infection   . Hypertension   . Smoker 06/16/2015  . Umbilical hernia 06/16/2015  . Vaginal discharge 05/30/2014  . Vitamin D deficiency 06/19/2015  . Yeast infection 05/30/2014    Past Surgical History:  Procedure Laterality Date  . CESAREAN SECTION    . TUBAL LIGATION      Family History  Problem Relation Age of Onset  . Hypertension Mother   . Hypertension Brother   . Sickle cell anemia Brother   . Kidney disease Father   . Alzheimer's disease Maternal Grandmother   . Cancer Maternal Grandfather   . Other Brother        born with fluid on brain  . Asthma Son     Social History   Socioeconomic History  . Marital status: Legally Separated    Spouse name: Not on file  . Number of children: Not on file  . Years of education: Not on file  . Highest  education level: Not on file  Occupational History  . Not on file  Tobacco Use  . Smoking status: Current Every Day Smoker    Packs/day: 0.00    Years: 18.00    Pack years: 0.00    Types: Cigarettes  . Smokeless tobacco: Never Used  . Tobacco comment: 1 ppd  Vaping Use  . Vaping Use: Never used  Substance and Sexual Activity  . Alcohol use: No  . Drug use: No  . Sexual activity: Not Currently    Partners: Male    Birth control/protection: Surgical    Comment: tubal  Other Topics Concern  . Not on file  Social History Narrative   Works at [employer].    Married, has 3 children, 17, 12, 6.    Eats all food groups.   Wears seatbelt.   Exercises at work by walking/cleaning.      Social Determinants of Health   Financial Resource Strain:   . Difficulty of Paying Living Expenses:   Food Insecurity:   . Worried About Programme researcher, broadcasting/film/video in the Last Year:   . Barista in the Last Year:   Transportation Needs:   . Freight forwarder (Medical):   Marland Kitchen Lack of Transportation (Non-Medical):   Physical Activity:   . Days of Exercise per Week:   .  Minutes of Exercise per Session:   Stress:   . Feeling of Stress :   Social Connections:   . Frequency of Communication with Friends and Family:   . Frequency of Social Gatherings with Friends and Family:   . Attends Religious Services:   . Active Member of Clubs or Organizations:   . Attends Banker Meetings:   Marland Kitchen Marital Status:   Intimate Partner Violence:   . Fear of Current or Ex-Partner:   . Emotionally Abused:   Marland Kitchen Physically Abused:   . Sexually Abused:      Physical Exam   Vitals:   10/02/19 1730 10/02/19 1801  BP: (!) 131/92 (!) 136/92  Pulse: 81 80  Resp: (!) 23 16  Temp:    SpO2: 100% 100%    CONSTITUTIONAL: Well-appearing, NAD NEURO:  Alert and oriented x 3, no focal deficits EYES:  eyes equal and reactive ENT/NECK:  no LAD, no JVD CARDIO: Regular rate, well-perfused, normal S1 and  S2 PULM:  CTAB no wheezing or rhonchi GI/GU:  normal bowel sounds, non-distended, non-tender MSK/SPINE:  No gross deformities, no edema SKIN:  no rash, atraumatic PSYCH:  Appropriate speech and behavior  *Additional and/or pertinent findings included in MDM below  Diagnostic and Interventional Summary    EKG Interpretation  Date/Time:  Saturday October 02 2019 16:29:15 EDT Ventricular Rate:  90 PR Interval:    QRS Duration: 90 QT Interval:  383 QTC Calculation: 469 R Axis:   43 Text Interpretation: Sinus rhythm Consider right atrial enlargement Low voltage, precordial leads No previous ECGs available Confirmed by Kennis Carina [phone]) on 10/02/2019 5:50:01 PM      Labs Reviewed  D-DIMER, QUANTITATIVE (NOT AT [facility]) - Abnormal; Notable for the following components:      Result Value   D-Dimer, Quant 1.11 (*)    All other components within normal limits  CBC  COMPREHENSIVE METABOLIC PANEL    CT ANGIO CHEST PE W OR WO CONTRAST  Final Result    DG Chest Port 1 View  Final Result      Medications  meclizine (ANTIVERT) tablet 12.5 mg (12.5 mg Oral Given 10/02/19 1619)  iohexol (OMNIPAQUE) 350 MG/ML injection 100 mL (100 mLs Intravenous Contrast Given 10/02/19 1834)     Procedures  /  Critical Care Procedures  ED Course and Medical Decision Making  I have reviewed the triage vital signs, the nursing notes, and pertinent available records from the EMR.  Listed above are laboratory and imaging tests that I personally ordered, reviewed, and interpreted and then considered in my medical decision making (see below for details).      Lightheadedness with shortness of breath x2 weeks, low risk for PE will screen with D-dimer.  Denies any vertigo type dizziness, ears are not impacted.  D-dimer is positive, CTA is negative.  Normal neurological exam, symptoms are not vertiginous, patient is appropriate for discharge.  Elmer Sow. Pilar Plate, MD [facility] Emergency Medicine [facility] [email or web address]  Final Clinical Impressions(s) / ED Diagnoses     ICD-10-CM   1. Lightheadedness  R42     ED Discharge Orders    None       Discharge Instructions Discussed with and Provided to Patient:     Discharge Instructions     You were evaluated in the Emergency Department and after careful evaluation, we did not find any emergent condition requiring admission or further testing in the hospital.  Your exam/testing today was overall  reassuring.  CAT scan did not show any blood clots.  As discussed, drink more water and eat more often throughout the day.  Please return to the Emergency Department if you experience any worsening of your condition.  We encourage you to follow up with a primary care provider.  Thank you for allowing [place] to be a part of your care.        Maudie Flakes, MD 10/02/19 1924

## 2019-10-02 NOTE — ED Notes (Signed)
From CT 

## 2019-10-02 NOTE — ED Triage Notes (Signed)
Pt c/o dizziness x 2 weeks, pt also reports "feels like ears are clogged up"

## 2019-10-02 NOTE — ED Notes (Signed)
Here for eval of report of dizzy x 2 week   Dr B in to assess

## 2020-05-09 ENCOUNTER — Emergency Department (HOSPITAL_COMMUNITY)
Admission: EM | Admit: 2020-05-09 | Discharge: 2020-05-09 | Disposition: A | Discharge status: Home / Self Care | Payer: Self-pay | Attending: Emergency Medicine | Admitting: Emergency Medicine

## 2020-05-09 ENCOUNTER — Encounter (HOSPITAL_COMMUNITY): Payer: Self-pay | Admitting: Emergency Medicine

## 2020-05-09 ENCOUNTER — Emergency Department (HOSPITAL_COMMUNITY): Payer: Self-pay

## 2020-05-09 ENCOUNTER — Other Ambulatory Visit: Payer: Self-pay

## 2020-05-09 DIAGNOSIS — M7122 Synovial cyst of popliteal space [Baker], left knee: Secondary | ICD-10-CM | POA: Insufficient documentation

## 2020-05-09 DIAGNOSIS — F17210 Nicotine dependence, cigarettes, uncomplicated: Secondary | ICD-10-CM | POA: Insufficient documentation

## 2020-05-09 DIAGNOSIS — N6452 Nipple discharge: Secondary | ICD-10-CM | POA: Insufficient documentation

## 2020-05-09 DIAGNOSIS — I10 Essential (primary) hypertension: Secondary | ICD-10-CM | POA: Insufficient documentation

## 2020-05-09 NOTE — ED Provider Notes (Signed)
[facility] EMERGENCY DEPARTMENT Provider Note   CSN: [id number] Arrival date & time: 05/09/20  0734     History Chief Complaint  Patient presents with  . Leg Pain  . Breast Pain    [name] is a 45 y.o. female.  HPI Patient presents with left lower extremity pain.  Began last night.  Comes for behind the knee down the leg.  No trauma.  No fevers.  No injury.  Has not had pains like this before.  Worse with walking.  No chest pain or difficulty breathing. Also states she has had left breast discharge.  States it is a brown discharge and she has had a previous cyst.  No real pain with it but states there is some drainage.  No trauma.  No fevers.    Past Medical History:  Diagnosis Date  . Anemia 05/31/2014  . BV (bacterial vaginosis) 06/16/2015  . Encounter for smoking cessation counseling 06/16/2015  . HSV (herpes simplex virus) infection   . Hypertension   . Smoker 06/16/2015  . Umbilical hernia 06/16/2015  . Vaginal discharge 05/30/2014  . Vitamin D deficiency 06/19/2015  . Yeast infection 05/30/2014    Patient Active Problem List   Diagnosis Date Noted  . Abnormal vaginal bleeding 04/20/2019  . Pregnancy examination or test, negative result 04/20/2019  . Burning with urination 08/12/2016  . Vitamin D deficiency 06/19/2015  . BV (bacterial vaginosis) 06/16/2015  . Smoker 06/16/2015  . Umbilical hernia without obstruction and without gangrene 06/16/2015  . Encounter for insertion of mirena IUD 06/16/2015  . Anemia 05/31/2014  . Vaginal discharge 05/30/2014  . Yeast infection 05/30/2014  . Routine gynecological examination 11/15/2013  . 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 CESAREAN SECTION    . TUBAL LIGATION       OB History    Gravida  3   Para  3   Term  2   Preterm  1   AB      Living  3     SAB      IAB      Ectopic      Multiple      Live Births  3        Obstetric  Comments  Had history of abnormal pap smear. Had colposcopy done, paps were normal since.         Family History  Problem Relation Age of Onset  . Hypertension Mother   . Hypertension Brother   . Sickle cell anemia Brother   . Kidney disease Father   . Alzheimer's disease Maternal Grandmother   . Cancer Maternal Grandfather   . Other Brother        born with fluid on brain  . Asthma Son     Social History   Tobacco Use  . Smoking status: Current Every Day Smoker    Packs/day: 0.00    Years: 18.00    Pack years: 0.00    Types: Cigarettes  . Smokeless tobacco: Never Used  . Tobacco comment: 1 ppd  Vaping Use  . Vaping Use: Never used  Substance Use Topics  . Alcohol use: No  . Drug use: No    Home Medications Prior to Admission medications   Medication Sig Start Date End Date Taking? Authorizing Provider  ferrous sulfate 325 (65 FE) MG tablet Take 1 tablet (325 mg total) by mouth 2 (two) times daily with a  meal. Patient taking differently: Take 325 mg by mouth 3 (three) times daily with meals.  07/08/17   Aliene Beams, MD  ibuprofen (ADVIL,MOTRIN) 200 MG tablet Take 800 mg by mouth every 6 (six) hours as needed for moderate pain.    [provider]    Allergies    Lisinopril  Review of Systems   Review of Systems  Constitutional: Negative for appetite change.  Respiratory: Negative for shortness of breath.   Cardiovascular:       Left breast discharge.  Gastrointestinal: Negative for abdominal pain.  Musculoskeletal: Negative for back pain.       Left lower leg and knee pain.  Skin: Negative for wound.  Neurological: Negative for dizziness.  Psychiatric/Behavioral: Negative for confusion.    Physical Exam Updated Vital Signs BP (!) 148/91   Pulse 87   Temp 98.3 F (36.8 C) (Oral)   Ht 4\' 11"  (1.499 m)   Wt 72.6 kg   SpO2 100%   BMI 32.32 kg/m   Physical Exam Vitals and nursing note reviewed.  HENT:     Head: Atraumatic.  Eyes:      Extraocular Movements: Extraocular movements intact.     Pupils: Pupils are equal, round, and reactive to light.  Cardiovascular:     Rate and Rhythm: Regular rhythm.  Pulmonary:     Comments: Left breast has some mild fullness on the lateral aspect.  There is some brown fluid that can be expressed with squeezing of the nipple.  It is overall almost clear.  Examined with chaperone. Abdominal:     Tenderness: There is no abdominal tenderness.  Musculoskeletal:     Cervical back: Neck supple.     Comments: Some fullness behind lateral aspect of left knee.  Good range of motion.  He does have some varicose veins on left lower leg.  No edema.  Mild calf tenderness.  Warm on bilateral lower extremities.  Skin:    General: Skin is warm.     Capillary Refill: Capillary refill takes less than 2 seconds.  Neurological:     Mental Status: She is alert and oriented to person, place, and time.     ED Results / Procedures / Treatments   Labs (all labs ordered are listed, but only abnormal results are displayed) Labs Reviewed - No data to display  EKG None  Radiology Venous Img Lower Unilateral Left  Result Date: 05/09/2020 CLINICAL DATA:  LEFT LEG PAIN. EXAM: LEFT LOWER EXTREMITY VENOUS DOPPLER ULTRASOUND TECHNIQUE: Gray-scale sonography with compression, as well as color and duplex ultrasound, were performed to evaluate the deep venous system(s) from the level of the common femoral vein through the popliteal and proximal calf veins. COMPARISON:  None. FINDINGS: VENOUS Normal compressibility of the common femoral, superficial femoral, and popliteal veins, as well as the visualized calf veins. Visualized portions of profunda femoral vein and great saphenous vein unremarkable. No filling defects to suggest DVT on grayscale or color Doppler imaging. Doppler waveforms show normal direction of venous flow, normal respiratory plasticity and response to augmentation. Limited views of the contralateral  common femoral vein are unremarkable. IMPRESSION: Negative for DVT. Electronically Signed   By: 05/11/2020 MD   On: 05/09/2020 09:43    Procedures Procedures (including critical care time)  Medications Ordered in ED Medications - No data to display  ED Course  I have reviewed the triage vital signs and the nursing notes.  Pertinent labs & imaging results that were available during  my care of the patient were reviewed by me and considered in my medical decision making (see chart for details).    MDM Rules/Calculators/A&P                          Patient with some pain on left lower extremity.  There is a fullness behind the knee that I think is likely a Baker's cyst.  No bony tenderness to the knee and knee is stable.  Do not feel that she needs an x-ray at this time.  However Doppler done due to her pain going down to the calf.  No DVT.  Ortho follow-up. Also some mild left-sided breast discharge.  Is brownish but does not appear infectious.  Has had a previous cyst.  Outpatient follow-up with GYN.  Discharge home. Final Clinical Impression(s) / ED Diagnoses Final diagnoses:  Synovial cyst of left popliteal space  Breast discharge    Rx / DC Orders ED Discharge Orders    None       Benjiman Core, MD 05/09/20 1035

## 2020-05-09 NOTE — ED Triage Notes (Signed)
Pt states the back of her left leg pain since last night. Denies any injury. Also c/o of a cyst on left breast x 1 year but drainage started 3 days ago.

## 2020-05-09 NOTE — Discharge Instructions (Addendum)
Follow-up with gynecology for the breast discharge.  Follow-up with orthopedic surgery for the pain in the knee that is likely from a Baker's cyst.

## 2020-05-30 ENCOUNTER — Other Ambulatory Visit (HOSPITAL_COMMUNITY): Payer: Self-pay | Admitting: *Deleted

## 2020-05-30 DIAGNOSIS — N6452 Nipple discharge: Secondary | ICD-10-CM

## 2020-06-06 ENCOUNTER — Other Ambulatory Visit (HOSPITAL_COMMUNITY): Payer: Self-pay

## 2020-06-06 ENCOUNTER — Ambulatory Visit (HOSPITAL_COMMUNITY)
Admission: RE | Admit: 2020-06-06 | Discharge: 2020-06-06 | Disposition: A | Discharge status: Home / Self Care | Payer: PRIVATE HEALTH INSURANCE | Source: Ambulatory Visit | Attending: *Deleted | Admitting: *Deleted

## 2020-06-06 ENCOUNTER — Other Ambulatory Visit: Payer: Self-pay

## 2020-06-06 DIAGNOSIS — N6452 Nipple discharge: Secondary | ICD-10-CM

## 2020-06-20 ENCOUNTER — Ambulatory Visit (INDEPENDENT_AMBULATORY_CARE_PROVIDER_SITE_OTHER): Payer: PRIVATE HEALTH INSURANCE | Admitting: General Surgery

## 2020-06-20 ENCOUNTER — Other Ambulatory Visit: Payer: Self-pay

## 2020-06-20 ENCOUNTER — Encounter: Payer: Self-pay | Admitting: General Surgery

## 2020-06-20 VITALS — BP 126/85 | HR 89 | Temp 97.9°F | Resp 14 | Ht 59.0 in | Wt 157.0 lb

## 2020-06-20 DIAGNOSIS — N6452 Nipple discharge: Secondary | ICD-10-CM | POA: Insufficient documentation

## 2020-06-20 NOTE — Patient Instructions (Signed)
Will try to get a MRI to further assess. If not will call and discuss excision of the duct to assess the duct that has the discharge.

## 2020-06-21 NOTE — Progress Notes (Signed)
[facility] History and Physical  Reason for Referral: Left nipple discharge  Referring Physician:  Reather Converse, PA-C   Chief Complaint    New Patient (Initial Visit)      [name] is a 45 y.o. female.  HPI:  [name] is a very sweet 45 yo noted left spontaneous nipple discharge in the shower in January. This has happened several times a week and has stopped somewhat after she got antibiotics for a UTI.  She says that it was always spontaneous and she did not express the discharge. It was always from the left breast. She reports that she has never had abnormal discharge before or discharge on the right.  She has a known left breast cyst that has been present but has never had infection or caused her problems.   The patient has no history of any prior masses, lumps, bumps, nipple changes or discharge. She had menarche at age 49, and her first pregnancy at age 59. She has no history of any family breast cancer.  She has not undergone menopause.  She has never had any previous biopsies or concerning areas on mammogram.  She has not had any chest radiation. She currently has no masses that she can feel.  [place] and mammogram revealed no obvious pathology.    Past Medical History:  Diagnosis Date  . Anemia 05/31/2014  . BV (bacterial vaginosis) 06/16/2015  . Encounter for smoking cessation counseling 06/16/2015  . HSV (herpes simplex virus) infection   . Hypertension   . Smoker 06/16/2015  . Umbilical hernia 06/16/2015  . Vaginal discharge 05/30/2014  . Vitamin D deficiency 06/19/2015  . Yeast infection 05/30/2014    Past Surgical History:  Procedure Laterality Date  . CESAREAN SECTION    . TUBAL LIGATION      Family History  Problem Relation Age of Onset  . Hypertension Mother   . Hypertension Brother   . Sickle cell anemia Brother   . Kidney disease Father   . Alzheimer's disease Maternal Grandmother   . Cancer Maternal Grandfather   . Other Brother         born with fluid on brain  . Asthma Son     Social History   Tobacco Use  . Smoking status: Current Every Day Smoker    Packs/day: 0.00    Years: 18.00    Pack years: 0.00    Types: Cigarettes  . Smokeless tobacco: Never Used  . Tobacco comment: 1 ppd  Vaping Use  . Vaping Use: Never used  Substance Use Topics  . Alcohol use: No  . Drug use: No    Medications: I have reviewed the patient's current medications. Allergies as of 06/20/2020      Reactions   Lisinopril Cough      Medication List       Accurate as of June 20, 2020 11:59 PM. If you have any questions, ask your nurse or doctor.        cholecalciferol 25 MCG (1000 UNIT) tablet Commonly known as: VITAMIN D3 Take 2,000 Units by mouth daily.   ferrous sulfate 325 (65 FE) MG tablet Take 1 tablet (325 mg total) by mouth 2 (two) times daily with a meal. What changed: when to take this   ibuprofen 200 MG tablet Commonly known as: ADVIL Take 800 mg by mouth every 6 (six) hours as needed for moderate pain.        ROS:  A comprehensive review of systems was  negative except for: Gastrointestinal: positive for nausea and reflux symptoms Integument/breast: positive for nipple discharge and on left, brown to yellow fluid  Blood pressure 126/85, pulse 89, temperature 97.9 F (36.6 C), temperature source Other (Comment), resp. rate 14, height 4\' 11"  (1.499 m), weight 157 lb (71.2 kg), SpO2 98 %. Physical Exam Vitals reviewed. Exam conducted with a chaperone present.  Constitutional:      Appearance: She is normal weight.  HENT:     Head: Normocephalic.     Nose: Nose normal.     Mouth/Throat:     Mouth: Mucous membranes are moist.  Eyes:     Extraocular Movements: Extraocular movements intact.  Cardiovascular:     Rate and Rhythm: Normal rate and regular rhythm.  Pulmonary:     Effort: Pulmonary effort is normal.     Breath sounds: Normal breath sounds.  Chest:  Breasts:     Right: No bleeding,  inverted nipple, mass, nipple discharge, skin change, tenderness, axillary adenopathy or supraclavicular adenopathy.     Left: Nipple discharge present. No bleeding, inverted nipple, mass, skin change, tenderness, axillary adenopathy or supraclavicular adenopathy.      Comments: Left nipple with expressed discharge from a single duct, yellow in color Abdominal:     General: There is no distension.     Palpations: Abdomen is soft.     Tenderness: There is no abdominal tenderness.  Musculoskeletal:        General: Normal range of motion.     Cervical back: Normal range of motion.  Lymphadenopathy:     Upper Body:     Right upper body: No supraclavicular or axillary adenopathy.     Left upper body: No supraclavicular or axillary adenopathy.  Skin:    General: Skin is warm.  Neurological:     General: No focal deficit present.     Mental Status: She is alert and oriented to person, place, and time.  Psychiatric:        Mood and Affect: Mood normal.        Behavior: Behavior normal.        Thought Content: Thought content normal.        Judgment: Judgment normal.     Results: CLINICAL DATA:  Patient presents with brownish left nipple discharge for approximately 1 month, both spontaneous and spontaneous.  EXAM: DIGITAL DIAGNOSTIC UNILATERAL LEFT MAMMOGRAM WITH TOMOSYNTHESIS AND CAD; ULTRASOUND LEFT BREAST LIMITED  TECHNIQUE: Left digital diagnostic mammography and breast tomosynthesis was performed. The images were evaluated with computer-aided detection.; Targeted ultrasound examination of the left axilla was performed.  COMPARISON:  Previous exam(s).  ACR Breast Density Category b: There are scattered areas of fibroglandular density.  FINDINGS: Circumscribed mass central left breast reflects a simple cyst, assessed with diagnostic ultrasound on 09/07/2019.  There are no other masses, no areas of architectural distortion and no suspicious calcifications. The cyst  has mildly increased in size from the prior exam. No other change.  On physical exam, no nipple discharge is visualized.  Targeted ultrasound is performed, showing a few mildly ectatic retroareolar ducts, no masses. No intraductal soft tissue or masses. Simple cyst noted deeper 11 o'clock retroareolar region.  IMPRESSION: 1. No evidence of breast malignancy. No findings to account the patient's nipple discharge. 2. Benign breast cyst.  RECOMMENDATION: 1. Screening mammogram in April 2022, last screening study dated 08/19/2019. 2. If the nipple discharge persists, surgical consultation be recommended with possible additional imaging with breast MRI.  I have discussed  the findings and recommendations with the patient. If applicable, a reminder letter will be sent to the patient regarding the next appointment.  BI-RADS CATEGORY  2: Benign.   Electronically Signed   By: Amie Portland M.D.   On: 06/06/2020 09:34  Assessment & Plan:  [name] is a 45 y.o. female with pathologic nipple discharge as it is unilateral and brown which could indicate some blood although yellow on my exam today.  She has never had discharge on the right side. No masses on exam.  [place] and mammogram were benign.   -The next step in the algorithm would be MRI to see if this is caused by an intraductal papilloma and could undergo local biopsy by radiology  -Depending on the ability to get the MRI [place] will need to get this approved through them, will proceed with MRI versus surgical excision of the duct in question since I can express discharge.  -Discussed that this is likely benign but that we must rule out this being related to any atypia or cancer   Will call once we get MRI or if MRI is not going to be possible to discuss surgical excision.   All questions were answered to the satisfaction of the patient.   [name] 06/21/2020, 2:55 PM

## 2020-06-24 IMAGING — DX DG CHEST 1V PORT
1 series · 1 of 1 positions shown · non-contrast
Comparison: 01/23/2006

CLINICAL DATA: Shortness of breath for 2 weeks

EXAM:
PORTABLE CHEST 1 VIEW

[chest ap]
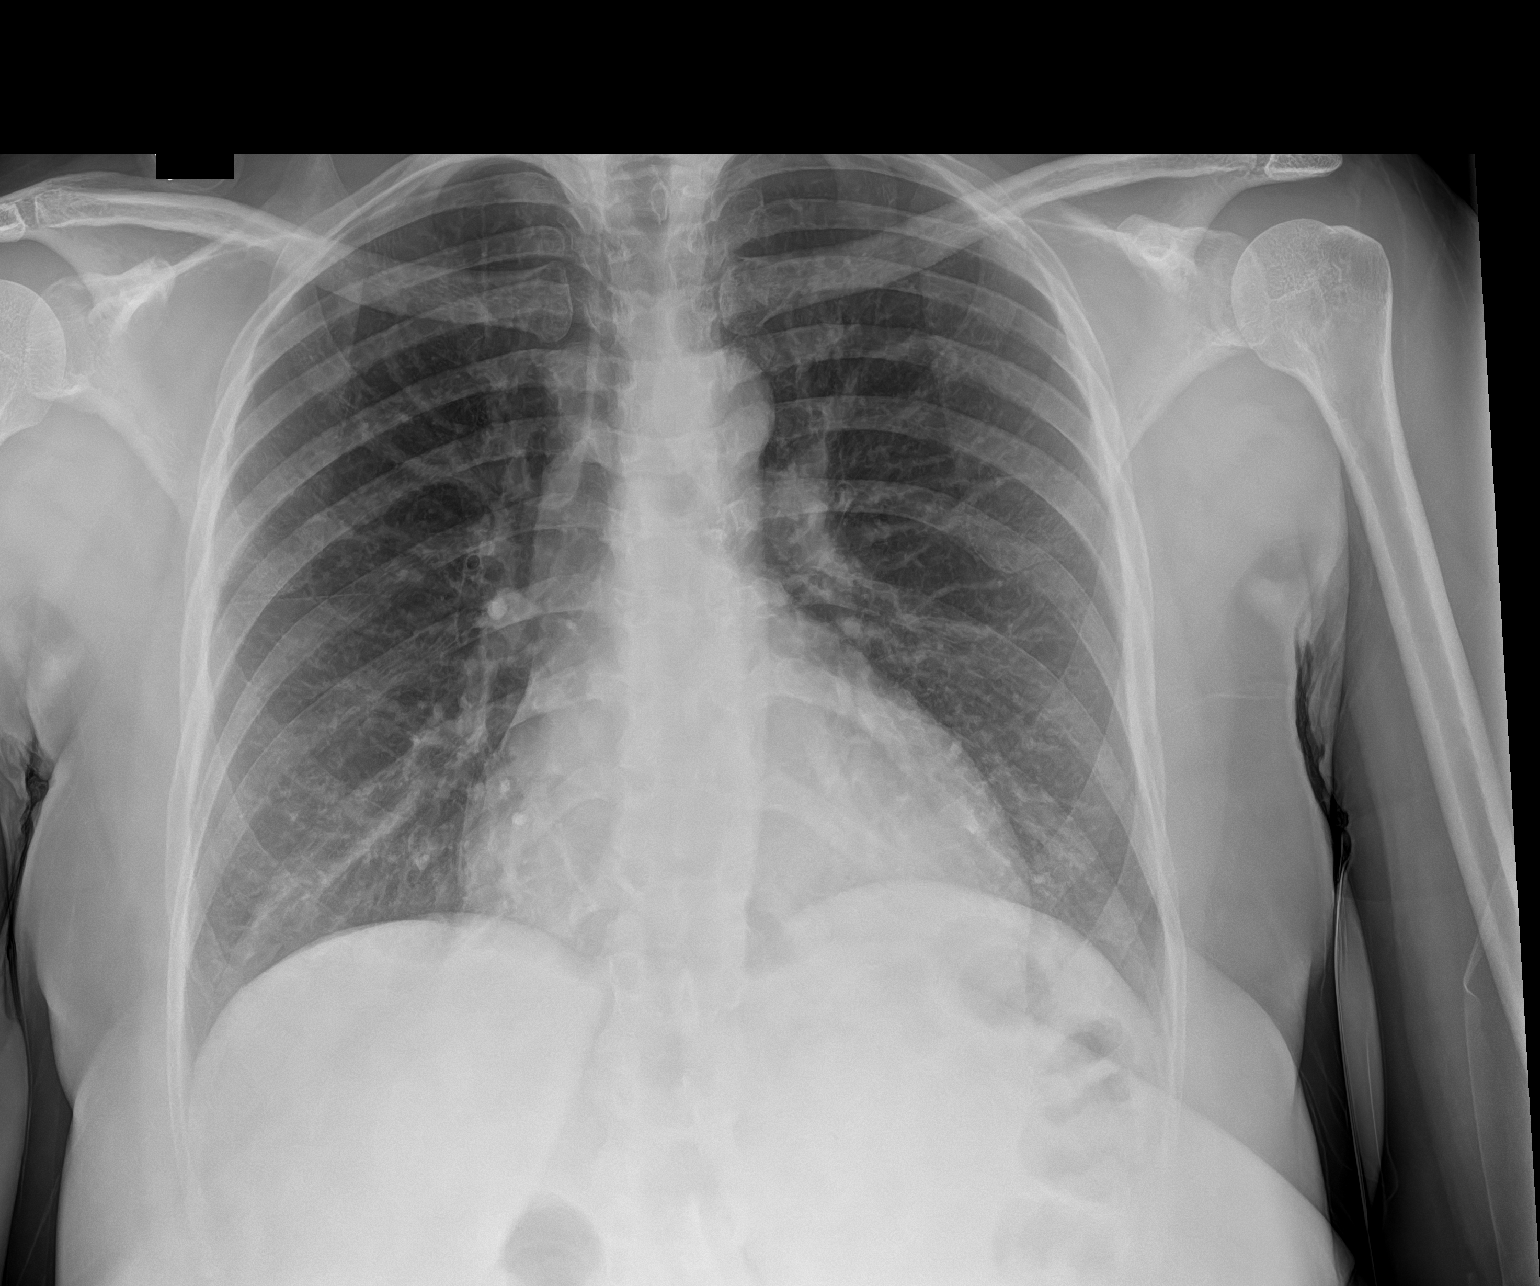

[1 of 1 positions shown; findings below may reference images not displayed]

FINDINGS: Cardiac shadow is mildly enlarged but stable. The lungs are clear.
No bony abnormality is noted.
IMPRESSION: No active disease.

## 2020-06-27 ENCOUNTER — Other Ambulatory Visit (HOSPITAL_COMMUNITY): Payer: Self-pay

## 2020-06-27 ENCOUNTER — Encounter (HOSPITAL_COMMUNITY): Payer: Self-pay

## 2020-06-30 ENCOUNTER — Telehealth: Payer: Self-pay

## 2020-06-30 ENCOUNTER — Other Ambulatory Visit (HOSPITAL_COMMUNITY): Payer: Self-pay | Admitting: General Surgery

## 2020-06-30 ENCOUNTER — Other Ambulatory Visit: Payer: Self-pay | Admitting: Family Medicine

## 2020-06-30 DIAGNOSIS — N6452 Nipple discharge: Secondary | ICD-10-CM

## 2020-06-30 NOTE — Telephone Encounter (Signed)
Client of [facility] called asking information about eye exams. Discussed that [facility] utilizes referrals to [employer] and they will assist based on need and availability of funds.  Discussed process is to send her contact information to [employer] SW who reviews [place] [employer] referrals and submits applications. Verbal consent to proceed with referral.  Referral send via secure HIPPA compliant email  To [employer] SW of integrated health care.    Francee Nodal RN Clara Gunn/[facility]

## 2020-07-12 ENCOUNTER — Ambulatory Visit (HOSPITAL_COMMUNITY)
Admission: RE | Admit: 2020-07-12 | Discharge: 2020-07-12 | Disposition: A | Discharge status: Home / Self Care | Payer: Self-pay | Source: Ambulatory Visit | Attending: General Surgery | Admitting: General Surgery

## 2020-07-12 ENCOUNTER — Other Ambulatory Visit: Payer: Self-pay

## 2020-07-12 DIAGNOSIS — N6452 Nipple discharge: Secondary | ICD-10-CM | POA: Insufficient documentation

## 2020-07-12 MED ORDER — GADOBUTROL 1 MMOL/ML IV SOLN
7.0000 mL | Freq: Once | INTRAVENOUS | Status: AC | PRN
  Administered 2020-07-12: 7 mL via INTRAVENOUS

## 2020-07-14 ENCOUNTER — Telehealth (INDEPENDENT_AMBULATORY_CARE_PROVIDER_SITE_OTHER): Payer: PRIVATE HEALTH INSURANCE | Admitting: General Surgery

## 2020-07-14 DIAGNOSIS — N6452 Nipple discharge: Secondary | ICD-10-CM

## 2020-07-14 NOTE — Telephone Encounter (Signed)
[facility]  MRI without obvious lesion or pathology to explain the left nipple discharge. Most likely physiologic and will stop on its own. Discussed with patient, she should get her normal screening mammogram in April [facility] should order). Will see her back in 2 months to see how the drainage is doing and if it is has resolved.  At that time can proceed with ductal excision if the drainage has persisted and is problematic to the patient.  Plan to see her in late May 2022. Will have the office contact her.   Algis Greenhouse, MD [facility] [address] [place], [place] [address] [phone] (office)

## 2020-07-17 NOTE — Telephone Encounter (Signed)
Noted and reminder note scheduled

## 2020-07-18 ENCOUNTER — Other Ambulatory Visit (HOSPITAL_COMMUNITY): Payer: Self-pay | Admitting: *Deleted

## 2020-07-18 DIAGNOSIS — Z1231 Encounter for screening mammogram for malignant neoplasm of breast: Secondary | ICD-10-CM

## 2020-08-07 ENCOUNTER — Encounter (HOSPITAL_COMMUNITY): Payer: PRIVATE HEALTH INSURANCE

## 2020-08-21 ENCOUNTER — Ambulatory Visit (HOSPITAL_COMMUNITY)
Admission: RE | Admit: 2020-08-21 | Discharge: 2020-08-21 | Disposition: A | Discharge status: Home / Self Care | Payer: Self-pay | Source: Ambulatory Visit | Attending: *Deleted | Admitting: *Deleted

## 2020-08-21 DIAGNOSIS — Z1231 Encounter for screening mammogram for malignant neoplasm of breast: Secondary | ICD-10-CM

## 2020-09-19 ENCOUNTER — Ambulatory Visit: Payer: PRIVATE HEALTH INSURANCE | Admitting: General Surgery

## 2020-09-21 ENCOUNTER — Ambulatory Visit (INDEPENDENT_AMBULATORY_CARE_PROVIDER_SITE_OTHER): Payer: PRIVATE HEALTH INSURANCE | Admitting: General Surgery

## 2020-09-21 ENCOUNTER — Encounter: Payer: Self-pay | Admitting: General Surgery

## 2020-09-21 ENCOUNTER — Other Ambulatory Visit: Payer: Self-pay

## 2020-09-21 VITALS — BP 121/85 | HR 87 | Temp 98.6°F | Resp 14 | Ht 59.0 in | Wt 155.0 lb

## 2020-09-21 DIAGNOSIS — N6452 Nipple discharge: Secondary | ICD-10-CM

## 2020-09-21 NOTE — Patient Instructions (Signed)
Monitor discharge. Think about option of excision to relieve the symptoms.

## 2020-09-21 NOTE — Progress Notes (Signed)
[facility] Note   HPI:  45 y.o. Female presents to clinic for follow-up evaluation of her left nipple discharge that continues. It is spontaneous and more clear yellow in nature. She had an MRI that demonstrated no pathology in March and annual mammogram that demonstrated no issues in May.   Review of Systems:  Continued drainage No masses All other review of systems: otherwise negative   Vital Signs:  BP 121/85   Pulse 87   Temp 98.6 F (37 C) (Other (Comment))   Resp 14   Ht 4\' 11"  (1.499 m)   Wt 155 lb (70.3 kg)   SpO2 98%   BMI 31.31 kg/m    Physical Exam:  Physical Exam Vitals reviewed.  Cardiovascular:     Rate and Rhythm: Normal rate.  Pulmonary:     Effort: Pulmonary effort is normal.  Chest:  Breasts:     Right: No mass, nipple discharge, skin change or tenderness.     Left: Nipple discharge present. No mass, skin change, tenderness or axillary adenopathy.    Lymphadenopathy:     Upper Body:     Left upper body: No axillary adenopathy.    CLINICAL DATA:  Screening.  EXAM: DIGITAL SCREENING BILATERAL MAMMOGRAM WITH TOMOSYNTHESIS AND CAD  TECHNIQUE: Bilateral screening digital craniocaudal and mediolateral oblique mammograms were obtained. Bilateral screening digital breast tomosynthesis was performed. The images were evaluated with computer-aided detection.  COMPARISON:  Previous exam(s).  ACR Breast Density Category c: The breast tissue is heterogeneously dense, which may obscure small masses.  FINDINGS: There are no findings suspicious for malignancy. The images were evaluated with computer-aided detection.  IMPRESSION: No mammographic evidence of malignancy. A result letter of this screening mammogram will be mailed directly to the patient.  RECOMMENDATION: Screening mammogram in one year. (Code:SM-B-01Y)  BI-RADS CATEGORY  1: Negative.   Electronically Signed   By: M.D.   On: 08/21/2020  12:45  CLINICAL DATA:  45 year old female presenting for evaluation of approximately 2 months of spontaneous and non spontaneous brownish left nipple discharge. She had a diagnostic workup with mammography and ultrasound 06/06/2020 demonstrating no suspicious abnormalities.  LABS:  None  EXAM: BILATERAL BREAST MRI WITH AND WITHOUT CONTRAST  TECHNIQUE: Multiplanar, multisequence MR images of both breasts were obtained prior to and following the intravenous administration of 7 ml of Gadavist  Three-dimensional MR images were rendered by post-processing of the original MR data on an independent workstation. The three-dimensional MR images were interpreted, and findings are reported in the following complete MRI report for this study. Three dimensional images were evaluated at the independent interpreting workstation using the DynaCAD thin client.  COMPARISON:  No prior MRI available for comparison.  FINDINGS: Breast composition: b. Scattered fibroglandular tissue.  Background parenchymal enhancement: Mild to moderate.  Right breast: No mass or abnormal enhancement.  Left breast: There is a nonenhancing benign cyst in the central far posterior left breast measuring 2.8 cm. No mass or abnormal enhancement.  Lymph nodes: No abnormal appearing lymph nodes.  Ancillary findings:  None.  IMPRESSION: 1. No MRI findings in the left breast to explain the patient's nipple discharge.  2.  No evidence of malignancy in the bilateral breasts.  RECOMMENDATION: 1. If the left nipple discharge persists, surgical consultation is recommended.  2.  Annual screening mammogram is due in April of 2022.  BI-RADS CATEGORY  2: Benign.   Electronically Signed   By: 12-05-1968 M.D.   On: 07/13/2020 08:00  Assessment:  45 y.o. yo Female with left nipple discharge that is likely benign given character and imaging. Discussed could be from small papilloma or  ectasia. Discussed option of excision to stop drainage and to prove only benign issue.  She wants to think about things and monitor as this could stop spontaneously. It has been going on for about 6 months.   Plan:  Monitor discharge. Think about option of excision to relieve the symptoms.    Future Appointments  Date Time Provider Department Center  12/14/2020  1:15 PM Lucretia Roers, MD [facility] None     Algis Greenhouse, MD [facility] [address] [place], [place] [address] [phone] (office)

## 2020-10-13 ENCOUNTER — Other Ambulatory Visit (HOSPITAL_COMMUNITY)
Admission: RE | Admit: 2020-10-13 | Discharge: 2020-10-13 | Disposition: A | Discharge status: Home / Self Care | Payer: PRIVATE HEALTH INSURANCE | Source: Ambulatory Visit | Attending: Obstetrics and Gynecology | Admitting: Obstetrics and Gynecology

## 2020-10-13 ENCOUNTER — Ambulatory Visit: Payer: PRIVATE HEALTH INSURANCE | Admitting: *Deleted

## 2020-10-13 ENCOUNTER — Other Ambulatory Visit: Payer: Self-pay

## 2020-10-13 VITALS — BP 131/90 | Wt 153.4 lb

## 2020-10-13 DIAGNOSIS — N6452 Nipple discharge: Secondary | ICD-10-CM

## 2020-10-13 DIAGNOSIS — Z1239 Encounter for other screening for malignant neoplasm of breast: Secondary | ICD-10-CM

## 2020-10-13 NOTE — Patient Instructions (Signed)
Explained breast self awareness with [name]. Patient did not need a Pap smear today due to last Pap smear and HPV typing was 04/01/2019. Let her know [facility] will cover Pap smears and HPV typing every 5 years unless has a history of abnormal Pap smears. Referred patient to [facility] for follow-up of breast discharge. Appointment scheduled Thursday, December 14, 2020 at 1315. Patient aware of appointment and will be there. Let patient know will follow up with her within the next week with results of breast discharge by phone. Discussed smoking cessation with patient. Referred to the [facility] Quitline and gave resources to the free smoking cessation classes at [facility]. [name] verbalized understanding.  Ronny Ruddell, [name], RN 11:22 AM

## 2020-10-13 NOTE — Progress Notes (Signed)
[name] is a 45 y.o. female who presents to [facility] clinic today with complaint of left breast spontaneous discharge since December 2021 or January 2022 that changes colors. She stated she has seen it yellow, clear, brown, and recently was bloody.Patient stated she has noticed it at least two times weekly.   Pap Smear: Pap smear not completed today. Last Pap smear was 04/01/2019 at the [facility] Department clinic and was normal with negative HPV per Mikayla Guthrie, RN at the [facility] Department. Per patient has history of an abnormal Pap smear around 15 years ago that a colposcopy was completed for follow-up. Per patient all Pap smears have been normal since colposcopy and has had at least three normal Pap smears since colposcopy. Last Pap smear result is not available in Epic.   Physical exam: Breasts Breasts symmetrical. No skin abnormalities bilateral breasts. No nipple retraction bilateral breasts. No nipple discharge right breast. Expressed a brownish colored discharge from left nipple on exam. Sample of discharge sent to Cytology for evaluation. No lymphadenopathy. No lumps palpated bilateral breasts. Complaints of left lower breast tenderness on exam.  MM DIAG BREAST TOMO UNI LEFT  Result Date: 07/21/2018 CLINICAL DATA:  Recall from screening mammography with tomosynthesis possible mass involving the UPPER INNER LEFT breast at MIDDLE depth. EXAM: DIGITAL DIAGNOSTIC LEFT MAMMOGRAM WITH TOMO ULTRASOUND LEFT BREAST COMPARISON:  Previous exam(s). ACR Breast Density Category b: There are scattered areas of fibroglandular density. FINDINGS: Tomosynthesis and synthesized spot-compression CC and MLO views of the area of concern in the LEFT breast were obtained. Best seen on the spot compression MLO images is a circumscribed very low-density mass measuring approximately 1 cm in the UPPER INNER QUADRANT without associated architectural distortion or suspicious  calcifications. Targeted LEFT breast ultrasound is performed, showing a benign simple cyst at the 11 o'clock position approximately 5 cm from the nipple at MIDDLE depth measuring approximately 0.6 x 1.0 x 1.1 cm, demonstrating posterior acoustic enhancement and no internal color Doppler flow, corresponding to the screening mammographic finding. No suspicious solid mass or abnormal acoustic shadowing is identified. IMPRESSION: 1. No mammographic or sonographic evidence of malignancy involving the LEFT breast. 2. Benign approximate 1 cm simple cyst involving the UPPER INNER QUADRANT of the LEFT breast which accounts for the screening mammographic finding. RECOMMENDATION: Screening mammogram in one year.(Code:SM-B-01Y) I have discussed the findings and recommendations with the patient. If applicable, a reminder letter will be sent to the patient regarding the next appointment. BI-RADS CATEGORY  2: Benign. Electronically Signed   By: Hulan Saas M.D.   On: 07/21/2018 16:56   MM 3D SCREEN BREAST BILATERAL  Result Date: 08/19/2019 CLINICAL DATA:  Screening. EXAM: DIGITAL SCREENING BILATERAL MAMMOGRAM WITH TOMO AND CAD COMPARISON:  Previous exam(s). ACR Breast Density Category c: The breast tissue is heterogeneously dense, which may obscure small masses. FINDINGS: In the left breast, a possible mass warrants further evaluation. In the right breast, no findings suspicious for malignancy. Images were processed with CAD. IMPRESSION: Further evaluation is suggested for possible mass in the left breast. RECOMMENDATION: Ultrasound of the left breast. (Code:US-L-79M) The patient will be contacted regarding the findings, and additional imaging will be scheduled. BI-RADS CATEGORY  0: Incomplete. Need additional imaging evaluation and/or prior mammograms for comparison. Electronically Signed   By: Sherian Rein M.D.   On: 08/19/2019 13:14   MM 3D SCREEN BREAST BILATERAL  Result Date: 07/02/2018 CLINICAL DATA:   Screening. EXAM: DIGITAL SCREENING BILATERAL MAMMOGRAM WITH  TOMO AND CAD COMPARISON:  Previous exam(s). ACR Breast Density Category b: There are scattered areas of fibroglandular density. FINDINGS: In the left breast, a possible mass warrants further evaluation. In the right breast, no findings suspicious for malignancy. Images were processed with CAD. IMPRESSION: Further evaluation is suggested for possible mass in the left breast. RECOMMENDATION: Diagnostic mammogram and possibly ultrasound of the left breast. (Code:FI-L-48M) The patient will be contacted regarding the findings, and additional imaging will be scheduled. BI-RADS CATEGORY  0: Incomplete. Need additional imaging evaluation and/or prior mammograms for comparison. Electronically Signed   By: Beckie Salts M.D.   On: 07/02/2018 13:41   MM SCREENING BREAST TOMO BILATERAL  Result Date: 03/27/2017 CLINICAL DATA:  Screening. EXAM: 2D DIGITAL SCREENING BILATERAL MAMMOGRAM WITH CAD AND ADJUNCT TOMO COMPARISON:  Previous exam(s). ACR Breast Density Category b: There are scattered areas of fibroglandular density. FINDINGS: There are no findings suspicious for malignancy. Images were processed with CAD. IMPRESSION: No mammographic evidence of malignancy. A result letter of this screening mammogram will be mailed directly to the patient. RECOMMENDATION: Screening mammogram in one year. (Code:SM-B-01Y) BI-RADS CATEGORY  1: Negative. Electronically Signed   By: Amie Portland M.D.   On: 03/27/2017 12:33   MS DIGITAL SCREENING TOMO BILATERAL  Result Date: 08/21/2020 CLINICAL DATA:  Screening. EXAM: DIGITAL SCREENING BILATERAL MAMMOGRAM WITH TOMOSYNTHESIS AND CAD TECHNIQUE: Bilateral screening digital craniocaudal and mediolateral oblique mammograms were obtained. Bilateral screening digital breast tomosynthesis was performed. The images were evaluated with computer-aided detection. COMPARISON:  Previous exam(s). ACR Breast Density Category c: The breast tissue  is heterogeneously dense, which may obscure small masses. FINDINGS: There are no findings suspicious for malignancy. The images were evaluated with computer-aided detection. IMPRESSION: No mammographic evidence of malignancy. A result letter of this screening mammogram will be mailed directly to the patient. RECOMMENDATION: Screening mammogram in one year. (Code:SM-B-01Y) BI-RADS CATEGORY  1: Negative. Electronically Signed   By: Annia Belt M.D.   On: 08/21/2020 12:45   MS DIGITAL DIAG TOMO UNI LEFT  Result Date: 06/06/2020 CLINICAL DATA:  Patient presents with brownish left nipple discharge for approximately 1 month, both spontaneous and spontaneous. EXAM: DIGITAL DIAGNOSTIC UNILATERAL LEFT MAMMOGRAM WITH TOMOSYNTHESIS AND CAD; ULTRASOUND LEFT BREAST LIMITED TECHNIQUE: Left digital diagnostic mammography and breast tomosynthesis was performed. The images were evaluated with computer-aided detection.; Targeted ultrasound examination of the left axilla was performed. COMPARISON:  Previous exam(s). ACR Breast Density Category b: There are scattered areas of fibroglandular density. FINDINGS: Circumscribed mass central left breast reflects a simple cyst, assessed with diagnostic ultrasound on 09/07/2019. There are no other masses, no areas of architectural distortion and no suspicious calcifications. The cyst has mildly increased in size from the prior exam. No other change. On physical exam, no nipple discharge is visualized. Targeted ultrasound is performed, showing a few mildly ectatic retroareolar ducts, no masses. No intraductal soft tissue or masses. Simple cyst noted deeper 11 o'clock retroareolar region. IMPRESSION: 1. No evidence of breast malignancy. No findings to account the patient's nipple discharge. 2. Benign breast cyst. RECOMMENDATION: 1. Screening mammogram in April 2022, last screening study dated 08/19/2019. 2. If the nipple discharge persists, surgical consultation be recommended with possible  additional imaging with breast MRI. I have discussed the findings and recommendations with the patient. If applicable, a reminder letter will be sent to the patient regarding the next appointment. BI-RADS CATEGORY  2: Benign. Electronically Signed   By: Amie Portland M.D.   On: 06/06/2020 09:34  Pelvic/Bimanual Pap is not indicated today per BCCCP guidelines.   Smoking History: Patient is a current smoker. Discussed smoking cessation with patient. Referred to the [facility] Quitline and gave resources to the free smoking cessation classes at [facility].   Patient Navigation: Patient education provided. Access to services provided for patient through BCCCP program.    Breast and Cervical Cancer Risk Assessment: Patient does not have family history of breast cancer, known genetic mutations, or radiation treatment to the chest before age 39. Per patient has history of cervical dysplasia. Patient has no history of being immunocompromised or DES exposure in-utero.  Risk Assessment     Risk Scores       10/13/2020   Last edited by: Meryl Dare, CMA   5-year risk: 0.8 %   Lifetime risk: 9.4 %            A: BCCCP exam without pap smear Complaint of yellowish/bloody left nipple discharge since December 2021 or January 2022.  P: Referred patient to [facility] for follow-up of breast discharge. Appointment scheduled Thursday, December 14, 2020 at 1315.  Priscille Heidelberg, RN 10/13/2020 11:22 AM

## 2020-10-17 LAB — CYTOLOGY - NON PAP

## 2020-10-24 ENCOUNTER — Telehealth: Payer: Self-pay | Admitting: *Deleted

## 2020-10-24 NOTE — Telephone Encounter (Signed)
Patient called requesting results for Breast Discharge. Let patient know that no malignant cells were identified and to keep appointment at [facility] on 12/14/20. Patient verbalized understanding.

## 2020-12-14 ENCOUNTER — Other Ambulatory Visit: Payer: Self-pay

## 2020-12-14 ENCOUNTER — Ambulatory Visit (INDEPENDENT_AMBULATORY_CARE_PROVIDER_SITE_OTHER): Payer: PRIVATE HEALTH INSURANCE | Admitting: General Surgery

## 2020-12-14 ENCOUNTER — Encounter: Payer: Self-pay | Admitting: General Surgery

## 2020-12-14 VITALS — BP 131/87 | HR 86 | Temp 98.7°F | Resp 16 | Ht 59.0 in | Wt 153.0 lb

## 2020-12-14 DIAGNOSIS — N6452 Nipple discharge: Secondary | ICD-10-CM | POA: Diagnosis not present

## 2020-12-14 NOTE — Patient Instructions (Signed)
Call with changes in nipple discharge. Get your routine mammogram.

## 2020-12-18 NOTE — Progress Notes (Signed)
[facility] Note   HPI:  45 y.o. Female presents to clinic for follow-up evaluation of her nipple discharge. She has not had any more discharge. MRI and mammogram negative.   Review of Systems:  No discharge All other review of systems: otherwise negative   Vital Signs:  BP 131/87   Pulse 86   Temp 98.7 F (37.1 C) (Other (Comment))   Resp 16   Ht 4\' 11"  (1.499 m)   Wt 153 lb (69.4 kg)   SpO2 97%   BMI 30.90 kg/m    Physical Exam:  Physical Exam Cardiovascular:     Rate and Rhythm: Normal rate.  Pulmonary:     Effort: Pulmonary effort is normal.  Neurological:     Mental Status: She is alert.  Breast exam deferred    Assessment:  45 y.o. yo Female with prior nipple discharge, no resolved, and imaging reassuring.  Plan:  Call with changes in nipple discharge. Get your routine mammogram.  PRN follow up   56, MD [facility] [address] [place], [place] [phone] (office)

## 2021-03-21 ENCOUNTER — Other Ambulatory Visit: Payer: Self-pay

## 2021-03-21 ENCOUNTER — Emergency Department (HOSPITAL_COMMUNITY)
Admission: EM | Admit: 2021-03-21 | Discharge: 2021-03-21 | Disposition: A | Discharge status: Home / Self Care | Payer: PRIVATE HEALTH INSURANCE | Attending: Emergency Medicine | Admitting: Emergency Medicine

## 2021-03-21 ENCOUNTER — Encounter (HOSPITAL_COMMUNITY): Payer: Self-pay

## 2021-03-21 DIAGNOSIS — I10 Essential (primary) hypertension: Secondary | ICD-10-CM | POA: Insufficient documentation

## 2021-03-21 DIAGNOSIS — F17210 Nicotine dependence, cigarettes, uncomplicated: Secondary | ICD-10-CM | POA: Insufficient documentation

## 2021-03-21 NOTE — Discharge Instructions (Signed)
Schedule to see your Physician forrecheck 

## 2021-03-21 NOTE — ED Triage Notes (Signed)
Patient with complaints of hypertension over the several days. BP at home 140-150 at home.

## 2021-03-21 NOTE — ED Provider Notes (Signed)
[facility] EMERGENCY DEPARTMENT Provider Note   CSN: [id number] Arrival date & time: 03/21/21  6160     History Chief Complaint  Patient presents with   Hypertension    [name] is a 45 y.o. female.  Pt reports her blood pressure was elevated at home.  Pt reports she checks her blood pressure frequently.    The history is provided by the patient. No language interpreter was used.  Hypertension This is a new problem. The problem occurs constantly. The problem has not changed since onset.Associated symptoms include headaches. Nothing aggravates the symptoms. Nothing relieves the symptoms. She has tried nothing for the symptoms. The treatment provided no relief.      Past Medical History:  Diagnosis Date   Anemia 05/31/2014   BV (bacterial vaginosis) 06/16/2015   Encounter for smoking cessation counseling 06/16/2015   HSV (herpes simplex virus) infection    Hypertension    Smoker 06/16/2015   Umbilical hernia 06/16/2015   Vaginal discharge 05/30/2014   Vitamin D deficiency 06/19/2015   Yeast infection 05/30/2014    Patient Active Problem List   Diagnosis Date Noted   Discharge from left nipple 06/20/2020   Abnormal vaginal bleeding 04/20/2019   Pregnancy examination or test, negative result 04/20/2019   Burning with urination 08/12/2016   Vitamin D deficiency 06/19/2015   BV (bacterial vaginosis) 06/16/2015   Smoker 06/16/2015   Umbilical hernia without obstruction and without gangrene 06/16/2015   Encounter for insertion of mirena IUD 06/16/2015   Anemia 05/31/2014   Vaginal discharge 05/30/2014   Yeast infection 05/30/2014   Routine gynecological examination 11/15/2013   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 Had colposcopy done, paps were normal since.          Family History  Problem Relation Age of Onset   Hypertension Mother    Hypertension Brother    Sickle cell anemia Brother    Kidney disease Father    Alzheimer's disease Maternal Grandmother    Cancer Maternal Grandfather    Other Brother        born with fluid on brain   Asthma Son     Social History   Tobacco Use   Smoking status: Every Day    Packs/day: 0.00    Years: 18.00    Pack years: 0.00    Types: Cigarettes   Smokeless tobacco: Never   Tobacco comments:    1 ppd  Vaping Use   Vaping Use: Never used  Substance Use Topics   Alcohol use: No   Drug use: No    Home Medications Prior to Admission medications   Medication Sig Start Date End Date Taking? Authorizing Provider  cholecalciferol (VITAMIN D3) 25 MCG (1000 UNIT) tablet Take 2,000 Units by mouth daily.    [provider]  ferrous sulfate 325 (65 FE) MG tablet Take  1 tablet (325 mg total) by mouth 2 (two) times daily with a meal. Patient taking differently: Take 325 mg by mouth 3 (three) times daily with meals. 07/08/17   Aliene Beams, MD  ibuprofen (ADVIL,MOTRIN) 200 MG tablet Take 800 mg by mouth every 6 (six) hours as needed for moderate pain.    [provider]  vitamin C (ASCORBIC ACID) 500 MG tablet Take 500 mg by mouth daily.    [provider]    Allergies    Lisinopril  Review of Systems   Review of Systems  Neurological:  Positive for headaches.  All other systems reviewed and are negative.  Physical Exam Updated Vital Signs BP (!) 128/99 (BP Location: Right Arm)   Pulse 64   Temp 98 F (36.7 C) (Oral)   Ht 4\' 11"  (1.499 m)   Wt 68 kg   SpO2 100%   BMI 30.30 kg/m   Physical Exam Vitals and nursing note reviewed.  Constitutional:      Appearance: She is well-developed.  HENT:     Head: Normocephalic.      Mouth/Throat:     Mouth: Mucous membranes are moist.  Cardiovascular:     Rate and Rhythm: Normal rate.  Pulmonary:     Effort: Pulmonary effort is normal.  Abdominal:     General: There is no distension.  Musculoskeletal:        General: Normal range of motion.     Cervical back: Normal range of motion.  Skin:    General: Skin is warm.  Neurological:     Mental Status: She is alert and oriented to person, place, and time.  Psychiatric:        Mood and Affect: Mood normal.    ED Results / Procedures / Treatments   Labs (all labs ordered are listed, but only abnormal results are displayed) Labs Reviewed - No data to display  EKG None  Radiology No results found.  Procedures Procedures   Medications Ordered in ED Medications - No data to display  ED Course  I have reviewed the triage vital signs and the nursing notes.  Pertinent labs & imaging results that were available during my care of the patient were reviewed by me and considered in my medical decision making (see chart for details).    MDM Rules/Calculators/A&P                           MDM:  blood pressure 128/99.  Pt counseled on life style changes and need to see primary care for evaltuion  Final Clinical Impression(s) / ED Diagnoses Final diagnoses:  Hypertension, unspecified type    Rx / DC Orders ED Discharge Orders     None     An After Visit Summary was printed and given to the patient.    , Elson Areas 03/22/21 1054    14/01/22, MD 03/24/21 1425

## 2021-04-27 ENCOUNTER — Telehealth: Payer: Self-pay | Admitting: Obstetrics & Gynecology

## 2021-04-27 NOTE — Telephone Encounter (Signed)
Left message @ 11:37 am. [name] ?

## 2021-04-27 NOTE — Telephone Encounter (Signed)
Patient came in 2 years ago and got the mirena and she said she is wanting to see if it is normal to bleed more than she normally does. Please advise.

## 2021-04-30 ENCOUNTER — Other Ambulatory Visit: Payer: Self-pay

## 2021-04-30 ENCOUNTER — Encounter: Payer: Self-pay | Admitting: Physician Assistant

## 2021-04-30 ENCOUNTER — Ambulatory Visit: Payer: Self-pay | Admitting: Physician Assistant

## 2021-04-30 VITALS — BP 123/85 | HR 78 | Temp 98.0°F | Ht 61.0 in | Wt 151.0 lb

## 2021-04-30 DIAGNOSIS — F17200 Nicotine dependence, unspecified, uncomplicated: Secondary | ICD-10-CM

## 2021-04-30 DIAGNOSIS — Z7689 Persons encountering health services in other specified circumstances: Secondary | ICD-10-CM

## 2021-04-30 NOTE — Progress Notes (Signed)
BP 123/85    Pulse 78    Temp 98 F (36.7 C)    Ht 5\' 1"  (1.549 m)    Wt 151 lb (68.5 kg)    SpO2 97%    BMI 28.53 kg/m    Subjective:    Patient ID: , female    DOB: 02/19/1976, 45 y.o.   MRN: [id number]  HPI: [name] is a 46 y.o. female presenting on 04/30/2021 for New Patient (Initial Visit)   HPI   Chief Complaint  Patient presents with   New Patient (Initial Visit)     Pt is 45yoF who presents to establish care.  She says she Was going to [facility] presviously and was last seen there in October or November.  She works at [employer] in [place].   She says she just started with them after having worked there thru a [employer] for 2 or 3 years.  She says she should be getting insurance with them in about a month or so.  Pt only complaint is mirena.  She says she is bleeding a little more than usual.  She doesn't think she has FP medicaid but hasn't applied for it.   She is s/p BTL but says she has the mirena due to fibroids.      Relevant past medical, surgical, family and social history reviewed and updated as indicated. Interim medical history since our last visit reviewed. Allergies and medications reviewed and updated.   Current Outpatient Medications:    levonorgestrel (MIRENA) 20 MCG/DAY IUD, 1 each by Intrauterine route once. (Inserted 2021), Disp: , Rfl:    vitamin C (ASCORBIC ACID) 500 MG tablet, Take 500 mg by mouth daily., Disp: , Rfl:    cetirizine (ZYRTEC) 10 MG tablet, Take 10 mg by mouth daily. (Patient not taking: Reported on 04/30/2021), Disp: , Rfl:    cholecalciferol (VITAMIN D3) 25 MCG (1000 UNIT) tablet, Take 2,000 Units by mouth daily. (Patient not taking: Reported on 04/30/2021), Disp: , Rfl:    ferrous sulfate 325 (65 FE) MG tablet, Take 1 tablet (325 mg total) by mouth 2 (two) times daily with a meal. (Patient not taking: Reported on 04/30/2021), Disp: 60 tablet, Rfl: 3     Review of Systems  Per HPI unless  specifically indicated above     Objective:    BP 123/85    Pulse 78    Temp 98 F (36.7 C)    Ht 5\' 1"  (1.549 m)    Wt 151 lb (68.5 kg)    SpO2 97%    BMI 28.53 kg/m   Wt Readings from Last 3 Encounters:  04/30/21 151 lb (68.5 kg)  03/21/21 150 lb (68 kg)  12/14/20 153 lb (69.4 kg)    Physical Exam Vitals reviewed.  Constitutional:      General: She is not in acute distress.    Appearance: She is well-developed. She is not ill-appearing.  HENT:     Head: Normocephalic and atraumatic.     Right Ear: Tympanic membrane, ear canal and external ear normal.     Left Ear: Tympanic membrane, ear canal and external ear normal.  Eyes:     Extraocular Movements: Extraocular movements intact.     Conjunctiva/sclera: Conjunctivae normal.     Pupils: Pupils are equal, round, and reactive to light.  Neck:     Thyroid: No thyromegaly.  Cardiovascular:     Rate and Rhythm: Normal rate and regular rhythm.  Pulmonary:  Effort: Pulmonary effort is normal.     Breath sounds: Normal breath sounds.  Abdominal:     General: Bowel sounds are normal.     Palpations: Abdomen is soft. There is no mass.     Tenderness: There is no abdominal tenderness.  Musculoskeletal:     Cervical back: Neck supple.     Right lower leg: No edema.     Left lower leg: No edema.  Lymphadenopathy:     Cervical: No cervical adenopathy.  Skin:    General: Skin is warm and dry.  Neurological:     Mental Status: She is alert and oriented to person, place, and time.     Motor: No weakness or tremor.     Gait: Gait is intact. Gait normal.     Deep Tendon Reflexes:     Reflex Scores:      Patellar reflexes are 2+ on the right side and 2+ on the left side. Psychiatric:        Attention and Perception: Attention normal.        Speech: Speech normal.        Behavior: Behavior normal. Behavior is cooperative.           Assessment & Plan:   Encounter Diagnoses  Name Primary?   Encounter to establish care  Yes   Tobacco use disorder       -pt is encouraged to get covid booster -pt is counseled on smoking cessation -pt is encouraged to apply for [facility] medicaid which would cover her mirena care and when she gets the [facility] medicaid she can schedule appointment with gyn -labs reviewed and are UTD.  Mammogram and PAP also UTD.  She is not due for colon cancer screening until November.   -pt to follow up in April.  She is to contact office sooner prn

## 2021-04-30 NOTE — Telephone Encounter (Signed)
Left message @ 11:12 am. JSY

## 2021-05-01 NOTE — Telephone Encounter (Signed)
Pt has had Mirena for 2 years. She is having more bleeding than usual. Pt hasn't tried to feel IUD strings. No new sex partners. Pt was advised to schedule an appt with provider to have IUD checked. Pt voiced understanding and call was transferred to front desk for appt. JSY

## 2021-05-04 ENCOUNTER — Ambulatory Visit (INDEPENDENT_AMBULATORY_CARE_PROVIDER_SITE_OTHER): Payer: PRIVATE HEALTH INSURANCE | Admitting: Adult Health

## 2021-05-04 ENCOUNTER — Encounter: Payer: Self-pay | Admitting: Adult Health

## 2021-05-04 ENCOUNTER — Other Ambulatory Visit: Payer: Self-pay

## 2021-05-04 VITALS — BP 132/84 | HR 89 | Ht 59.0 in | Wt 151.0 lb

## 2021-05-04 DIAGNOSIS — Z3202 Encounter for pregnancy test, result negative: Secondary | ICD-10-CM

## 2021-05-04 DIAGNOSIS — Z975 Presence of (intrauterine) contraceptive device: Secondary | ICD-10-CM | POA: Diagnosis not present

## 2021-05-04 DIAGNOSIS — N921 Excessive and frequent menstruation with irregular cycle: Secondary | ICD-10-CM | POA: Insufficient documentation

## 2021-05-04 LAB — POCT URINE PREGNANCY: Preg Test, Ur: NEGATIVE

## 2021-05-04 NOTE — Progress Notes (Signed)
°  Subjective:     Patient ID: [name], female   DOB: [birth year], 46 y.o.   MRN: [id number]  HPI [place] is a 46 year old black female,separated, in complaining of more bleeding with IUD, she had mirena placed 06/02/19 for bleeding has a fibroid. She wants it checked. Usually just spots, but had bleeding 6 days with last period. 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 PCP is Jacquelin Hawking PA.  Negative pap 04/01/2019   Review of Systems +bleeding more with IUD   Reviewed past medical,surgical, social and family history. Reviewed medications and allergies.   Objective:   Physical Exam BP 132/84 (BP Location: Left Arm, Patient Position: Sitting, Cuff Size: Normal)    Pulse 89    Ht 4\' 11"  (1.499 m)    Wt 151 lb (68.5 kg)    LMP 04/21/2021    BMI 30.50 kg/m     UPT is negative. Skin warm and dry.Pelvic: external genitalia is normal in appearance no lesions, vagina: creamy discharge without odor,urethra has no lesions or masses noted, cervix:bulbous, +IUD strings at os, uterus: ULNS,normal  shape and contour, non tender, no masses felt, adnexa: no masses or tenderness noted. Bladder is non tender and no masses felt. Fall risk is low  Upstream - 05/04/21 0945       Pregnancy Intention Screening   Does the patient want to become pregnant in the next year? No    Does the patient's partner want to become pregnant in the next year? No    Would the patient like to discuss contraceptive options today? No      Contraception Wrap Up   Current Method Female Sterilization    End Method Female Sterilization    Contraception Counseling Provided No              Examination chaperoned by 05/06/21 LPN  Assessment:     1. Pregnancy examination or test, negative result   2. IUD (intrauterine device) in place   3. Breakthrough bleeding with IUD Offered meds, she declines will watch for now     Plan:     Follow up prn

## 2021-05-09 ENCOUNTER — Other Ambulatory Visit: Payer: Self-pay

## 2021-05-09 DIAGNOSIS — N6320 Unspecified lump in the left breast, unspecified quadrant: Secondary | ICD-10-CM

## 2021-05-31 ENCOUNTER — Ambulatory Visit
Admission: RE | Admit: 2021-05-31 | Discharge: 2021-05-31 | Disposition: A | Discharge status: Home / Self Care | Payer: PRIVATE HEALTH INSURANCE | Source: Ambulatory Visit | Attending: Obstetrics and Gynecology | Admitting: Obstetrics and Gynecology

## 2021-05-31 ENCOUNTER — Ambulatory Visit
Admission: RE | Admit: 2021-05-31 | Discharge: 2021-05-31 | Disposition: A | Discharge status: Home / Self Care | Payer: No Typology Code available for payment source | Source: Ambulatory Visit | Attending: Obstetrics and Gynecology | Admitting: Obstetrics and Gynecology

## 2021-05-31 ENCOUNTER — Ambulatory Visit: Payer: Self-pay | Admitting: *Deleted

## 2021-05-31 ENCOUNTER — Other Ambulatory Visit: Payer: Self-pay

## 2021-05-31 ENCOUNTER — Other Ambulatory Visit: Payer: Self-pay | Admitting: Obstetrics and Gynecology

## 2021-05-31 VITALS — BP 130/84 | Wt 151.6 lb

## 2021-05-31 DIAGNOSIS — N6320 Unspecified lump in the left breast, unspecified quadrant: Secondary | ICD-10-CM

## 2021-05-31 DIAGNOSIS — N6322 Unspecified lump in the left breast, upper inner quadrant: Secondary | ICD-10-CM

## 2021-05-31 DIAGNOSIS — N6452 Nipple discharge: Secondary | ICD-10-CM

## 2021-05-31 DIAGNOSIS — N644 Mastodynia: Secondary | ICD-10-CM

## 2021-05-31 DIAGNOSIS — Z1239 Encounter for other screening for malignant neoplasm of breast: Secondary | ICD-10-CM

## 2021-05-31 NOTE — Patient Instructions (Signed)
Explained breast self awareness with [name]. Patient did not need a Pap smear today due to last Pap smear and HPV typing was 04/01/2019. Let her know BCCCP will cover Pap smears and HPV typing every 5 years unless has a history of abnormal Pap smears. Referred patient to the [facility] for a left breast diagnostic mammogram. Appointment scheduled Thursday, May 31, 2021 at 1400. Patient aware of appointment and will be there. Mikayla Kronk verbalized understanding.  Kaleiyah Polsky, Arvil Chaco, RN 12:56 PM

## 2021-05-31 NOTE — Progress Notes (Signed)
[name] is a 46 y.o. female who presents to [facility] clinic today with complaint of left breast lump x 3 weeks that is painful. Patient states the pain comes and goes and increased during her menstrual period. Patient rated the pain at a 5 out of 10.    Pap Smear: Pap smear not completed today. Last Pap smear was 04/01/2019 at the [facility] Department clinic and was normal with negative HPV per Mikayla Pick, RN at the [facility] Department. Per patient has history of an abnormal Pap smear around 15 years ago that a colposcopy was completed for follow-up. Per patient all Pap smears have been normal since colposcopy and has had at least three normal Pap smears since colposcopy. Last Pap smear result is not available in Epic.   Physical exam: Breasts Breasts symmetrical. No skin abnormalities bilateral breasts. No nipple retraction bilateral breasts. No nipple discharge right breast. Expressed a yellow colored discharge from left breast on exam. Sample of discharge sent to Cytology on previous visit 10/13/2020 that no malignant cells were identified. No lymphadenopathy. No lumps palpated right breast. Palpated a lump within the left breast at 11 o'clock 2 cm from the nipple. Complaints of tenderness when palpated left breast lump.   Pelvic/Bimanual Pap is not indicated today per BCCCP guidelines.    Smoking History: Patient is a current smoker. Discussed smoking cessation with patient. Referred to the [facility] Quitline.   Patient Navigation: Patient education provided. Access to services provided for patient through BCCCP program.    Breast and Cervical Cancer Risk Assessment: Patient does not have family history of breast cancer, known genetic mutations, or radiation treatment to the chest before age 49. Per patient has history of cervical dysplasia. Patient has no history of being immunocompromised or DES exposure in-utero.  Risk Assessment     Risk Scores        05/31/2021 10/13/2020   Last edited by: Mikayla Revel, LPN [name], CMA   5-year risk: 0.8 % 0.8 %   Lifetime risk: 9.4 % 9.4 %            A: BCCCP exam without pap smear Complaint of left breast lump, pain, and discharge.  P: Referred patient to the [facility] for a left breast diagnostic mammogram. Appointment scheduled Thursday, May 31, 2021 at 1400.  Mikayla Parish, RN 05/31/2021 12:56 PM

## 2021-06-11 ENCOUNTER — Telehealth: Payer: Self-pay

## 2021-06-11 NOTE — Telephone Encounter (Signed)
Pt contacted [facility] to receive assistance on how to referred back into the dental program.    States she had missed last dental appt, but was advised to contact [facility] to clarify steps of getting dental services re-started.   Plan Pt was advised that [facility], D.Leavy Cella will contact her to clarify her status.

## 2021-07-27 ENCOUNTER — Other Ambulatory Visit (HOSPITAL_COMMUNITY): Payer: Self-pay | Admitting: Obstetrics and Gynecology

## 2021-07-27 DIAGNOSIS — Z1231 Encounter for screening mammogram for malignant neoplasm of breast: Secondary | ICD-10-CM

## 2021-08-15 ENCOUNTER — Ambulatory Visit: Payer: Self-pay | Admitting: Physician Assistant

## 2021-08-20 ENCOUNTER — Other Ambulatory Visit (HOSPITAL_COMMUNITY): Payer: Self-pay | Admitting: Radiology

## 2021-09-10 ENCOUNTER — Emergency Department (HOSPITAL_COMMUNITY)
Admission: EM | Admit: 2021-09-10 | Discharge: 2021-09-10 | Disposition: A | Discharge status: Home / Self Care | Payer: No Typology Code available for payment source | Attending: Emergency Medicine | Admitting: Emergency Medicine

## 2021-09-10 ENCOUNTER — Other Ambulatory Visit: Payer: Self-pay

## 2021-09-10 ENCOUNTER — Encounter (HOSPITAL_COMMUNITY): Payer: Self-pay

## 2021-09-10 DIAGNOSIS — H6980 Other specified disorders of Eustachian tube, unspecified ear: Secondary | ICD-10-CM

## 2021-09-10 DIAGNOSIS — H6992 Unspecified Eustachian tube disorder, left ear: Secondary | ICD-10-CM | POA: Insufficient documentation

## 2021-09-10 MED ORDER — METHYLPREDNISOLONE 4 MG PO TBPK: ORAL_TABLET | ORAL | 0 refills | Status: DC

## 2021-09-10 NOTE — Discharge Instructions (Addendum)
Get help right away if: You have a sudden, severe increase in any of your symptoms.

## 2021-09-10 NOTE — ED Provider Notes (Signed)
[facility] EMERGENCY DEPARTMENT Provider Note   CSN: [id number] Arrival date & time: 09/10/21  [id number]     History  Chief Complaint  Patient presents with   Otalgia    [name] is a 46 y.o. female who presents with complaint of ear pain.  Patient has been having intermittent ear pain left greater than right for the past 4 days.  She complains of a sensation of fullness, decreased hearing.  Her left ear was bothering her all day yesterday.  She has a history of allergies and takes medications.  She denies nasal congestion drainage sore throat tooth pain difficulty swallowing.  She has no pain in her ears at this time.  She denies history of barotrauma   Otalgia     Home Medications Prior to Admission medications   Medication Sig Start Date End Date Taking? Authorizing Provider  methylPREDNISolone (MEDROL DOSEPAK) 4 MG TBPK tablet Use as directed 09/10/21  Yes Jennife Zaucha, PA-C  cetirizine (ZYRTEC) 10 MG tablet Take 10 mg by mouth daily.    [provider]  cholecalciferol (VITAMIN D3) 25 MCG (1000 UNIT) tablet Take 2,000 Units by mouth daily.    [provider]  ferrous sulfate 325 (65 FE) MG tablet Take 1 tablet (325 mg total) by mouth 2 (two) times daily with a meal. 07/08/17   Caren Macadam, MD  levonorgestrel (MIRENA) 20 MCG/DAY IUD 1 each by Intrauterine route once. (Inserted 2021)    [provider]  Multiple Vitamins-Minerals (ONE-A-DAY WOMENS PO) Take by mouth.    [provider]  vitamin C (ASCORBIC ACID) 500 MG tablet Take 500 mg by mouth daily.    [provider]      Allergies    Lisinopril    Review of Systems   Review of Systems  HENT:  Positive for ear pain.    Physical Exam Updated Vital Signs BP 130/84 (BP Location: Right Arm)   Pulse 80   Temp 98.1 F (36.7 C)   Resp 20   Ht 4\' 11"  (1.499 m)   Wt 68 kg   SpO2 100%   BMI 30.30 kg/m  Physical Exam Vitals and nursing note reviewed.  Constitutional:       General: She is not in acute distress.    Appearance: She is well-developed. She is not diaphoretic.  HENT:     Head: Normocephalic and atraumatic.     Right Ear: Tympanic membrane, ear canal and external ear normal.     Left Ear: Tympanic membrane, ear canal and external ear normal.     Nose: Nose normal.     Mouth/Throat:     Mouth: Mucous membranes are moist.  Eyes:     General: No scleral icterus.    Conjunctiva/sclera: Conjunctivae normal.  Cardiovascular:     Rate and Rhythm: Normal rate and regular rhythm.     Heart sounds: Normal heart sounds. No murmur heard.   No friction rub. No gallop.  Pulmonary:     Effort: Pulmonary effort is normal. No respiratory distress.     Breath sounds: Normal breath sounds.  Abdominal:     General: Bowel sounds are normal. There is no distension.     Palpations: Abdomen is soft. There is no mass.     Tenderness: There is no abdominal tenderness. There is no guarding.  Musculoskeletal:     Cervical back: Normal range of motion.  Skin:    General: Skin is warm and dry.  Neurological:  Mental Status: She is alert and oriented to person, place, and time.  Psychiatric:        Behavior: Behavior normal.    ED Results / Procedures / Treatments   Labs (all labs ordered are listed, but only abnormal results are displayed) Labs Reviewed - No data to display  EKG None  Radiology No results found.  Procedures Procedures    Medications Ordered in ED Medications - No data to display  ED Course/ Medical Decision Making/ A&P                           Medical Decision Making Patient here for pain.  No evidence of otitis media, otitis externa, tonsillitis, dental infection, mastoiditis.  Will discharge with Medrol taper, ENT follow-up and return precautions.  I considered CT imaging however there is no clinical evidence of mastoiditis or severe infection   Problems Addressed: Dysfunction of Eustachian tube, unspecified  laterality: acute illness or injury         Final Clinical Impression(s) / ED Diagnoses Final diagnoses:  Dysfunction of Eustachian tube, unspecified laterality    Rx / DC Orders ED Discharge Orders          Ordered    methylPREDNISolone (MEDROL DOSEPAK) 4 MG TBPK tablet        09/10/21 0953              Margarita Mail, PA-C 09/10/21 0955    Godfrey Pick, MD 09/11/21 [phone]

## 2021-09-10 NOTE — ED Triage Notes (Signed)
Patient with complaints of left ear pain that started 4 days prior. Denies pain at present.

## 2021-09-28 ENCOUNTER — Encounter (HOSPITAL_COMMUNITY): Payer: Self-pay

## 2021-09-28 ENCOUNTER — Inpatient Hospital Stay (HOSPITAL_COMMUNITY): Payer: No Typology Code available for payment source | Attending: Obstetrics and Gynecology | Admitting: *Deleted

## 2021-09-28 ENCOUNTER — Ambulatory Visit (HOSPITAL_COMMUNITY)
Admission: RE | Admit: 2021-09-28 | Discharge: 2021-09-28 | Disposition: A | Discharge status: Home / Self Care | Payer: Self-pay | Source: Ambulatory Visit | Attending: Obstetrics and Gynecology | Admitting: Obstetrics and Gynecology

## 2021-09-28 VITALS — BP 137/99 | Wt 152.5 lb

## 2021-09-28 DIAGNOSIS — Z1231 Encounter for screening mammogram for malignant neoplasm of breast: Secondary | ICD-10-CM | POA: Insufficient documentation

## 2021-09-28 DIAGNOSIS — Z1239 Encounter for other screening for malignant neoplasm of breast: Secondary | ICD-10-CM

## 2021-09-28 NOTE — Patient Instructions (Addendum)
Explained breast self awareness with [name]. Patient did not need a Pap smear today due to last Pap smear and HPV typing was 04/01/2019. Let her know BCCCP will cover Pap smears and HPV typing every 5 years unless has a history of abnormal Pap smears. Referred patient to [facility] for a screening mammogram per recommendation. Appointment scheduled Friday, September 28, 2021 at 1030. Patient aware of appointment and will be there. Let patient know [facility] will follow up with her within the next couple weeks with results of mammogram by letter or phone. Discussed smoking cessation with patient. Referred to the [facility] Quitline. [name] verbalized understanding.  [name], [name], RN 9:59 AM

## 2021-09-28 NOTE — Progress Notes (Signed)
[name] is a 46 y.o. female who presents to [facility] clinic today with complaint of left breast spontaneous discharge since December 2021 or January 2022 that changes colors that was noted on previous exam 10/13/2020. She stated is now a yellowish color. Sample of discharge was sent to Cytology 10/13/2020 that was benign. Patient complained of left breast pain x 2 years that comes and goes. Patient stated the pain increases during her menstrual period. Patient rated the pain at a 5 out of 10. Pain was noted on previous exam 05/31/2021 and patient denied any changes. Patient stated she no longer feels the left breast lump that was noted on previous exam 05/31/2021.   Pap Smear: Pap smear not completed today. Last Pap smear was 04/01/2019 at the [facility] clinic and was normal with negative HPV per Lisabeth Pick, RN at the [facility]. Per patient has history of an abnormal Pap smear around 16 years ago that a colposcopy was completed for follow-up. Per patient all Pap smears have been normal since colposcopy and has had at least three normal Pap smears since colposcopy. Last Pap smear result is not available in Epic.   Physical exam: Breasts Breasts symmetrical. No skin abnormalities bilateral breasts. No nipple retraction bilateral breasts. No nipple discharge right breast. Expressed a yellow colored discharge from left breast on exam. Sample of discharge sent to Cytology on previous visit 10/13/2020 that no malignant cells were identified. No lumps palpated bilateral breasts. No complaints of pain or tenderness on exam.  MS DIGITAL DIAG TOMO UNI LEFT  Addendum Date: 06/01/2021   ADDENDUM REPORT: 06/01/2021 12:33 ADDENDUM: A left ultrasound was also performed. Electronically Signed   By: Dorise Bullion III M.D.   On: 06/01/2021 12:33   Result Date: 06/01/2021 CLINICAL DATA:  The patient presents with a palpable lump in the left breast. EXAM: DIGITAL  DIAGNOSTIC UNILATERAL LEFT MAMMOGRAM WITH TOMOSYNTHESIS AND CAD TECHNIQUE: Left digital diagnostic mammography and breast tomosynthesis was performed. The images were evaluated with computer-aided detection. COMPARISON:  Previous exam(s). ACR Breast Density Category b: There are scattered areas of fibroglandular density. FINDINGS: The patient appears to be feeling a cyst fell and image in previous years. The cyst has grown slowly over the last several years. On physical exam, no suspicious lumps are identified. Targeted ultrasound is performed, showing a cyst in the left breast at 11 o'clock, 5 cm from the nipple measuring up to 2.4 cm accounting for the patient's lump. IMPRESSION: The patient is palpating a cyst in the left breast at 11 o'clock measuring up to 2.4 cm in size. RECOMMENDATION: Annual screening mammography. I have discussed the findings and recommendations with the patient. If applicable, a reminder letter will be sent to the patient regarding the next appointment. BI-RADS CATEGORY  2: Benign. Electronically Signed: By: Dorise Bullion III M.D. On: 05/31/2021 14:42  MS DIGITAL SCREENING TOMO BILATERAL  Result Date: 08/21/2020 CLINICAL DATA:  Screening. EXAM: DIGITAL SCREENING BILATERAL MAMMOGRAM WITH TOMOSYNTHESIS AND CAD TECHNIQUE: Bilateral screening digital craniocaudal and mediolateral oblique mammograms were obtained. Bilateral screening digital breast tomosynthesis was performed. The images were evaluated with computer-aided detection. COMPARISON:  Previous exam(s). ACR Breast Density Category c: The breast tissue is heterogeneously dense, which may obscure small masses. FINDINGS: There are no findings suspicious for malignancy. The images were evaluated with computer-aided detection. IMPRESSION: No mammographic evidence of malignancy. A result letter of this screening mammogram will be mailed directly to the patient. RECOMMENDATION: Screening  mammogram in one year. (Code:SM-B-01Y) BI-RADS  CATEGORY  1: Negative. Electronically Signed   By: Lovey Newcomer M.D.   On: 08/21/2020 12:45   MS DIGITAL DIAG TOMO UNI LEFT  Result Date: 06/06/2020 CLINICAL DATA:  Patient presents with brownish left nipple discharge for approximately 1 month, both spontaneous and spontaneous. EXAM: DIGITAL DIAGNOSTIC UNILATERAL LEFT MAMMOGRAM WITH TOMOSYNTHESIS AND CAD; ULTRASOUND LEFT BREAST LIMITED TECHNIQUE: Left digital diagnostic mammography and breast tomosynthesis was performed. The images were evaluated with computer-aided detection.; Targeted ultrasound examination of the left axilla was performed. COMPARISON:  Previous exam(s). ACR Breast Density Category b: There are scattered areas of fibroglandular density. FINDINGS: Circumscribed mass central left breast reflects a simple cyst, assessed with diagnostic ultrasound on 09/07/2019. There are no other masses, no areas of architectural distortion and no suspicious calcifications. The cyst has mildly increased in size from the prior exam. No other change. On physical exam, no nipple discharge is visualized. Targeted ultrasound is performed, showing a few mildly ectatic retroareolar ducts, no masses. No intraductal soft tissue or masses. Simple cyst noted deeper 11 o'clock retroareolar region. IMPRESSION: 1. No evidence of breast malignancy. No findings to account the patient's nipple discharge. 2. Benign breast cyst. RECOMMENDATION: 1. Screening mammogram in April 2022, last screening study dated 08/19/2019. 2. If the nipple discharge persists, surgical consultation be recommended with possible additional imaging with breast MRI. I have discussed the findings and recommendations with the patient. If applicable, a reminder letter will be sent to the patient regarding the next appointment. BI-RADS CATEGORY  2: Benign. Electronically Signed   By: Lajean Manes M.D.   On: 06/06/2020 09:34   MM 3D SCREEN BREAST BILATERAL  Result Date: 08/19/2019 CLINICAL DATA:  Screening.  EXAM: DIGITAL SCREENING BILATERAL MAMMOGRAM WITH TOMO AND CAD COMPARISON:  Previous exam(s). ACR Breast Density Category c: The breast tissue is heterogeneously dense, which may obscure small masses. FINDINGS: In the left breast, a possible mass warrants further evaluation. In the right breast, no findings suspicious for malignancy. Images were processed with CAD. IMPRESSION: Further evaluation is suggested for possible mass in the left breast. RECOMMENDATION: Ultrasound of the left breast. (Code:US-L-68M) The patient will be contacted regarding the findings, and additional imaging will be scheduled. BI-RADS CATEGORY  0: Incomplete. Need additional imaging evaluation and/or prior mammograms for comparison. Electronically Signed   By: Abelardo Diesel M.D.   On: 08/19/2019 13:14   MM DIAG BREAST TOMO UNI LEFT  Result Date: 07/21/2018 CLINICAL DATA:  Recall from screening mammography with tomosynthesis possible mass involving the UPPER INNER LEFT breast at MIDDLE depth. EXAM: DIGITAL DIAGNOSTIC LEFT MAMMOGRAM WITH TOMO ULTRASOUND LEFT BREAST COMPARISON:  Previous exam(s). ACR Breast Density Category b: There are scattered areas of fibroglandular density. FINDINGS: Tomosynthesis and synthesized spot-compression CC and MLO views of the area of concern in the LEFT breast were obtained. Best seen on the spot compression MLO images is a circumscribed very low-density mass measuring approximately 1 cm in the UPPER INNER QUADRANT without associated architectural distortion or suspicious calcifications. Targeted LEFT breast ultrasound is performed, showing a benign simple cyst at the 11 o'clock position approximately 5 cm from the nipple at MIDDLE depth measuring approximately 0.6 x 1.0 x 1.1 cm, demonstrating posterior acoustic enhancement and no internal color Doppler flow, corresponding to the screening mammographic finding. No suspicious solid mass or abnormal acoustic shadowing is identified. IMPRESSION: 1. No  mammographic or sonographic evidence of malignancy involving the LEFT breast. 2. Benign approximate 1 cm simple  cyst involving the UPPER INNER QUADRANT of the LEFT breast which accounts for the screening mammographic finding. RECOMMENDATION: Screening mammogram in one year.(Code:SM-B-01Y) I have discussed the findings and recommendations with the patient. If applicable, a reminder letter will be sent to the patient regarding the next appointment. BI-RADS CATEGORY  2: Benign. Electronically Signed   By: Evangeline Dakin M.D.   On: 07/21/2018 16:56   MM 3D SCREEN BREAST BILATERAL  Result Date: 07/02/2018 CLINICAL DATA:  Screening. EXAM: DIGITAL SCREENING BILATERAL MAMMOGRAM WITH TOMO AND CAD COMPARISON:  Previous exam(s). ACR Breast Density Category b: There are scattered areas of fibroglandular density. FINDINGS: In the left breast, a possible mass warrants further evaluation. In the right breast, no findings suspicious for malignancy. Images were processed with CAD. IMPRESSION: Further evaluation is suggested for possible mass in the left breast. RECOMMENDATION: Diagnostic mammogram and possibly ultrasound of the left breast. (Code:[id number]-19M) The patient will be contacted regarding the findings, and additional imaging will be scheduled. BI-RADS CATEGORY  0: Incomplete. Need additional imaging evaluation and/or prior mammograms for comparison. Electronically Signed   By: Claudie Revering M.D.   On: 07/02/2018 13:41   MM SCREENING BREAST TOMO BILATERAL  Result Date: 03/27/2017 CLINICAL DATA:  Screening. EXAM: 2D DIGITAL SCREENING BILATERAL MAMMOGRAM WITH CAD AND ADJUNCT TOMO COMPARISON:  Previous exam(s). ACR Breast Density Category b: There are scattered areas of fibroglandular density. FINDINGS: There are no findings suspicious for malignancy. Images were processed with CAD. IMPRESSION: No mammographic evidence of malignancy. A result letter of this screening mammogram will be mailed directly to the patient.  RECOMMENDATION: Screening mammogram in one year. (Code:SM-B-01Y) BI-RADS CATEGORY  1: Negative. Electronically Signed   By: Lajean Manes M.D.   On: 03/27/2017 12:33        Pelvic/Bimanual Pap is not indicated today per BCCCP guidelines.   Smoking History: Patient is a current smoker. Discussed smoking cessation with patient. Referred to the [facility] Quitline.   Patient Navigation: Patient education provided. Access to services provided for patient through BCCCP program.    Breast and Cervical Cancer Risk Assessment: Patient does not have family history of breast cancer, known genetic mutations, or radiation treatment to the chest before age 78. Per patient has history of cervical dysplasia. Patient has no history of being immunocompromised or DES exposure in-utero.  Risk Assessment     Risk Scores       09/28/2021 05/31/2021   Last edited by: Demetrius Revel, LPN McGill, Sherie Mamie Nick, LPN   5-year risk: 0.8 % 0.8 %   Lifetime risk: 9.4 % 9.4 %            A: BCCCP exam without pap smear Complaint of left breast discharge and pain.  P: Referred patient to [place] for a screening mammogram per recommendation. Appointment scheduled Friday, September 28, 2021 at 1030.  Loletta Parish, RN 09/28/2021 9:59 AM

## 2022-05-15 DIAGNOSIS — Z720 Tobacco use: Secondary | ICD-10-CM | POA: Diagnosis not present

## 2022-05-15 DIAGNOSIS — Z0001 Encounter for general adult medical examination with abnormal findings: Secondary | ICD-10-CM | POA: Diagnosis not present

## 2022-05-15 DIAGNOSIS — M5430 Sciatica, unspecified side: Secondary | ICD-10-CM | POA: Diagnosis not present

## 2022-05-15 DIAGNOSIS — J309 Allergic rhinitis, unspecified: Secondary | ICD-10-CM | POA: Diagnosis not present

## 2022-05-15 DIAGNOSIS — Z1329 Encounter for screening for other suspected endocrine disorder: Secondary | ICD-10-CM | POA: Diagnosis not present

## 2022-05-16 ENCOUNTER — Encounter (INDEPENDENT_AMBULATORY_CARE_PROVIDER_SITE_OTHER): Payer: Self-pay | Admitting: *Deleted

## 2022-05-28 ENCOUNTER — Ambulatory Visit (INDEPENDENT_AMBULATORY_CARE_PROVIDER_SITE_OTHER): Payer: Medicaid Other | Admitting: Obstetrics and Gynecology

## 2022-05-28 ENCOUNTER — Other Ambulatory Visit (HOSPITAL_COMMUNITY)
Admission: RE | Admit: 2022-05-28 | Discharge: 2022-05-28 | Disposition: A | Discharge status: Home / Self Care | Payer: Medicaid Other | Source: Ambulatory Visit | Attending: Obstetrics and Gynecology | Admitting: Obstetrics and Gynecology

## 2022-05-28 ENCOUNTER — Encounter: Payer: Self-pay | Admitting: Obstetrics and Gynecology

## 2022-05-28 VITALS — BP 138/89 | HR 90 | Ht 59.0 in | Wt 154.2 lb

## 2022-05-28 DIAGNOSIS — Z975 Presence of (intrauterine) contraceptive device: Secondary | ICD-10-CM | POA: Diagnosis not present

## 2022-05-28 DIAGNOSIS — Z01419 Encounter for gynecological examination (general) (routine) without abnormal findings: Secondary | ICD-10-CM

## 2022-05-28 NOTE — Progress Notes (Signed)
[name] is a 47 y.o. [phone] female here for a routine annual gynecologic exam.  Current complaints: Desires vaginal Std testing.   Denies abnormal vaginal bleeding, discharge, pelvic pain, problems with intercourse or other gynecologic concerns.    Gynecologic History No LMP recorded. (Menstrual status: IUD). Contraception: IUD Last Pap: 2020. Results were: normal Last mammogram: 6/23. Results were: normal  Obstetric History OB History  Gravida Para Term Preterm AB Living  3 3 2 1   3   SAB IAB Ectopic Multiple Live Births          3    # Outcome Date GA Lbr Len/2nd Weight Sex Delivery Anes PTL Lv  3 Term 08/01/10    M CS-LTranv   LIV  2 Preterm 2007 [redacted]w[redacted]d   F CS-Unspec   LIV     Birth Comments: breech  1 Term 2001 [redacted]w[redacted]d  6 lb 13 oz (3.09 kg) M Vag-Spont EPI  LIV    Obstetric Comments  Had history of abnormal pap smear. Had colposcopy done, paps were normal since.     Past Medical History:  Diagnosis Date   Anemia 05/31/2014   BV (bacterial vaginosis) 06/16/2015   Encounter for smoking cessation counseling 06/16/2015   HSV (herpes simplex virus) infection    Hypertension    Smoker 4/54/0981   Umbilical hernia 1/91/4782   Vaginal discharge 05/30/2014   Vitamin D deficiency 06/19/2015   Yeast infection 05/30/2014    Past Surgical History:  Procedure Laterality Date   CESAREAN SECTION     TUBAL LIGATION      Current Outpatient Medications on File Prior to Visit  Medication Sig Dispense Refill   cetirizine (ZYRTEC) 10 MG tablet Take 10 mg by mouth daily.     cholecalciferol (VITAMIN D3) 25 MCG (1000 UNIT) tablet Take 2,000 Units by mouth daily.     ferrous sulfate 325 (65 FE) MG tablet Take 1 tablet (325 mg total) by mouth 2 (two) times daily with a meal. 60 tablet 3   fluticasone (FLONASE) 50 MCG/ACT nasal spray Place 1 spray into both nostrils daily.     ibuprofen (ADVIL) 800 MG tablet Take 800 mg by mouth 3 (three) times daily as needed.     levonorgestrel (MIRENA) 20  MCG/DAY IUD 1 each by Intrauterine route once. (Inserted 2021)     Multiple Vitamins-Minerals (ONE-A-DAY WOMENS PO) Take by mouth.     vitamin C (ASCORBIC ACID) 500 MG tablet Take 500 mg by mouth daily.     cyclobenzaprine (FLEXERIL) 10 MG tablet Take 10 mg by mouth 3 (three) times daily as needed. (Patient not taking: Reported on 05/28/2022)     No current facility-administered medications on file prior to visit.    Allergies  Allergen Reactions   Lisinopril Cough    Social History   Socioeconomic History   Marital status: Legally Separated    Spouse name: Not on file   Number of children: 3   Years of education: Not on file   Highest education level: 10th grade  Occupational History   Not on file  Tobacco Use   Smoking status: Every Day    Packs/day: 0.50    Years: 18.00    Total pack years: 9.00    Types: Cigarettes   Smokeless tobacco: Never  Vaping Use   Vaping Use: Never used  Substance and Sexual Activity   Alcohol use: No   Drug use: Not Currently    Types: Marijuana    Comment:  none since ine her 38s   Sexual activity: Yes    Partners: Male    Birth control/protection: Surgical, I.U.D.    Comment: tubal  Other Topics Concern   Not on file  Social History Narrative   Works at [employer].    Social Determinants of Health   Financial Resource Strain: Not on file  Food Insecurity: No Food Insecurity (09/28/2021)   Hunger Vital Sign    Worried About Running Out of Food in the Last Year: Never true    Ran Out of Food in the Last Year: Never true  Transportation Needs: No Transportation Needs (09/28/2021)   PRAPARE - Hydrologist (Medical): No    Lack of Transportation (Non-Medical): No  Physical Activity: Not on file  Stress: Not on file  Social Connections: Not on file  Intimate Partner Violence: Not on file    Family History  Problem Relation Age of Onset   Alzheimer's disease Maternal Grandmother    Cancer Maternal Grandfather     Kidney disease Father    Hypertension Mother    Hypertension Brother    Sickle cell anemia Brother    Other Brother        born with fluid on brain   Asthma Son     The following portions of the patient's history were reviewed and updated as appropriate: allergies, current medications, past family history, past medical history, past social history, past surgical history and problem list.  Review of Systems Pertinent items noted in HPI and remainder of comprehensive ROS otherwise negative.   Objective:  BP 138/89 (BP Location: Right Arm, Patient Position: Sitting, Cuff Size: Normal)   Pulse 90   Ht 4\' 11"  (1.499 m)   Wt 154 lb 3.2 oz (69.9 kg)   BMI 31.14 kg/m  Chaperone present CONSTITUTIONAL: Well-developed, well-nourished female in no acute distress.  HENT:  Normocephalic, atraumatic, External right and left ear normal. Oropharynx is clear and moist EYES: Conjunctivae and EOM are normal. Pupils are equal, round, and reactive to light. No scleral icterus.  NECK: Normal range of motion, supple, no masses.  Normal thyroid.  SKIN: Skin is warm and dry. No rash noted. Not diaphoretic. No erythema. No pallor. [place]: Alert and oriented to person, place, and time. Normal reflexes, muscle tone coordination. No cranial nerve deficit noted. PSYCHIATRIC: Normal mood and affect. Normal behavior. Normal judgment and thought content. CARDIOVASCULAR: Normal heart rate noted, regular rhythm RESPIRATORY: Clear to auscultation bilaterally. Effort and breath sounds normal, no problems with respiration noted. BREASTS: Symmetric in size. No masses, skin changes, nipple drainage, or lymphadenopathy. ABDOMEN: Soft, normal bowel sounds, no distention noted.  No tenderness, rebound or guarding.  PELVIC: Normal appearing external genitalia; normal appearing vaginal mucosa and cervix.  No abnormal discharge noted.  Pap smear obtained.  Normal uterine size, no other palpable masses, no uterine or adnexal  tenderness. IUD string noted MUSCULOSKELETAL: Normal range of motion. No tenderness.  No cyanosis, clubbing, or edema.  2+ distal pulses.   Assessment:  Annual gynecologic examination with pap smear   Plan:  Will follow up results of pap smear and manage accordingly. STD testing, vaginal only at pt request Routine preventative health maintenance measures emphasized. Please refer to After Visit Summary for other counseling recommendations.    Chancy Milroy, MD, [place] Attending [place] for [facility], [place]

## 2022-05-29 LAB — CYTOLOGY - PAP
Chlamydia: NEGATIVE
Comment: NEGATIVE
Comment: NEGATIVE
Comment: NEGATIVE
Comment: NORMAL
Diagnosis: NEGATIVE
High risk HPV: NEGATIVE
Neisseria Gonorrhea: NEGATIVE
Trichomonas: NEGATIVE

## 2022-06-10 ENCOUNTER — Telehealth (INDEPENDENT_AMBULATORY_CARE_PROVIDER_SITE_OTHER): Payer: Self-pay | Admitting: *Deleted

## 2022-06-10 NOTE — Telephone Encounter (Signed)
  Procedure: colonoscopy  Estimated body mass index is 31.14 kg/m as calculated from the following:   Height as of 05/28/22: 4' 11"$  (1.499 m).   Weight as of 05/28/22: 154 lb 3.2 oz (69.9 kg).   Have you had a colonoscopy before?  no  Do you have family history of colon cancer?  no  Do you have a family history of polyps? no  Previous colonoscopy with polyps removed? no  Do you have a history colorectal cancer?   no  Are you diabetic?  no  Do you have a prosthetic or mechanical heart valve? no  Do you have a pacemaker/defibrillator?   no  Have you had endocarditis/atrial fibrillation?  no  Do you use supplemental oxygen/CPAP?  no  Have you had joint replacement within the last 12 months?  no  Do you tend to be constipated or have to use laxatives?  no   Do you have history of alcohol use? If yes, how much and how often.  no  Do you have history or are you using drugs? If yes, what do are you  using?  no  Have you ever had a stroke/heart attack?  no  Have you ever had a heart or other vascular stent placed,?no  Do you take weight loss medication? no  female patients,: have you had a hysterectomy? no                              are you post menopausal?  no                              do you still have your menstrual cycle? yes    Date of last menstrual period?   Do you take any blood-thinning medications such as: (Plavix, aspirin, Coumadin, Aggrenox, Brilinta, Xarelto, Eliquis, Pradaxa, Savaysa or Effient)? no  If yes we need the name, milligram, dosage and who is prescribing doctor:               Current Outpatient Medications  Medication Sig Dispense Refill   cetirizine (ZYRTEC) 10 MG tablet Take 10 mg by mouth daily.     cholecalciferol (VITAMIN D3) 25 MCG (1000 UNIT) tablet Take 2,000 Units by mouth daily.     cyclobenzaprine (FLEXERIL) 10 MG tablet Take 10 mg by mouth 3 (three) times daily as needed.     ferrous sulfate 325 (65 FE) MG tablet Take 1 tablet (325  mg total) by mouth 2 (two) times daily with a meal. 60 tablet 3   fluticasone (FLONASE) 50 MCG/ACT nasal spray Place 1 spray into both nostrils daily.     ibuprofen (ADVIL) 800 MG tablet Take 800 mg by mouth 3 (three) times daily as needed.     levonorgestrel (MIRENA) 20 MCG/DAY IUD 1 each by Intrauterine route once. (Inserted 2021)     Multiple Vitamins-Minerals (ONE-A-DAY WOMENS PO) Take by mouth.     vitamin C (ASCORBIC ACID) 500 MG tablet Take 500 mg by mouth daily.     No current facility-administered medications for this visit.    Allergies  Allergen Reactions   Lisinopril Cough

## 2022-06-11 ENCOUNTER — Encounter: Payer: Self-pay | Admitting: *Deleted

## 2022-06-11 ENCOUNTER — Other Ambulatory Visit: Payer: Self-pay | Admitting: *Deleted

## 2022-06-11 DIAGNOSIS — Z1211 Encounter for screening for malignant neoplasm of colon: Secondary | ICD-10-CM

## 2022-06-11 MED ORDER — PEG 3350-KCL-NA BICARB-NACL 420 G PO SOLR: 4000.0000 mL | Freq: Once | ORAL | 0 refills | Status: AC

## 2022-06-11 NOTE — Telephone Encounter (Signed)
ASA 2, needs pregnancy screen, no iron 7 days prior.

## 2022-06-11 NOTE — Telephone Encounter (Signed)
Pt has been scheduled for 07/04/22. Instructions sent to pt via MyChart and prep sent to the pharmacy.

## 2022-06-13 ENCOUNTER — Encounter: Payer: Self-pay | Admitting: *Deleted

## 2022-06-14 ENCOUNTER — Encounter: Payer: Self-pay | Admitting: Family Medicine

## 2022-06-14 ENCOUNTER — Ambulatory Visit: Payer: Medicaid Other | Admitting: Family Medicine

## 2022-06-14 VITALS — BP 138/88 | HR 88 | Ht 59.0 in | Wt 151.2 lb

## 2022-06-14 DIAGNOSIS — E559 Vitamin D deficiency, unspecified: Secondary | ICD-10-CM | POA: Diagnosis not present

## 2022-06-14 DIAGNOSIS — E7849 Other hyperlipidemia: Secondary | ICD-10-CM | POA: Diagnosis not present

## 2022-06-14 DIAGNOSIS — G8929 Other chronic pain: Secondary | ICD-10-CM

## 2022-06-14 DIAGNOSIS — R7301 Impaired fasting glucose: Secondary | ICD-10-CM

## 2022-06-14 DIAGNOSIS — F339 Major depressive disorder, recurrent, unspecified: Secondary | ICD-10-CM

## 2022-06-14 DIAGNOSIS — M79645 Pain in left finger(s): Secondary | ICD-10-CM

## 2022-06-14 DIAGNOSIS — Z1159 Encounter for screening for other viral diseases: Secondary | ICD-10-CM

## 2022-06-14 DIAGNOSIS — E0789 Other specified disorders of thyroid: Secondary | ICD-10-CM

## 2022-06-14 DIAGNOSIS — M5440 Lumbago with sciatica, unspecified side: Secondary | ICD-10-CM

## 2022-06-14 DIAGNOSIS — M79604 Pain in right leg: Secondary | ICD-10-CM

## 2022-06-14 DIAGNOSIS — Z114 Encounter for screening for human immunodeficiency virus [HIV]: Secondary | ICD-10-CM

## 2022-06-14 DIAGNOSIS — F419 Anxiety disorder, unspecified: Secondary | ICD-10-CM

## 2022-06-14 MED ORDER — TIZANIDINE HCL 4 MG PO CAPS: 4.0000 mg | ORAL_CAPSULE | Freq: Three times a day (TID) | ORAL | 1 refills | Status: DC

## 2022-06-14 MED ORDER — SERTRALINE HCL 25 MG PO TABS: 25.0000 mg | ORAL_TABLET | Freq: Every day | ORAL | 3 refills | Status: DC

## 2022-06-14 NOTE — Progress Notes (Unsigned)
New Patient Office Visit  Subjective:  Patient ID: [name], female    DOB: 1975-09-20  Age: 47 y.o. 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 Also been having left hand pain/swelling near her thumb onset of this began for 6 months or longer (12/12/2021).    Depression    Reports feeling anxious and depressive has trouble sleeping some nights. Been going on for a year or more (06/14/2021).    HPI [name] is a 47 y.o. female with past medical history of anemia, anxiety and depression, and sleep disturbance presents for establishing care.  Past Medical History:  Diagnosis Date   Anemia 05/31/2014   BV (bacterial vaginosis) 06/16/2015   Encounter for smoking cessation counseling 06/16/2015   HSV (herpes simplex virus) infection    Hypertension    Smoker [id number]   Umbilical hernia [id number]   Vaginal discharge 05/30/2014   Vitamin D deficiency 06/19/2015   Yeast infection 05/30/2014    Past Surgical History:  Procedure Laterality Date   CESAREAN SECTION     TUBAL LIGATION      Family History  Problem Relation Age of Onset   Alzheimer's disease Maternal Grandmother    Cancer Maternal Grandfather    Kidney disease Father    Hypertension Mother    Hypertension Brother    Sickle cell anemia Brother    Other Brother        born with fluid on brain   Asthma Son     Social History   Socioeconomic History   Marital status: Legally Separated    Spouse name: Not on file   Number of children: 3   Years of education: Not on file   Highest education level: 10th grade  Occupational History   Not on file  Tobacco Use   Smoking status: Every Day    Packs/day: 0.50    Years: 18.00    Total pack years: 9.00    Types: Cigarettes   Smokeless tobacco: Never    Tobacco comments:    1/2 a day.  Vaping Use   Vaping Use: Never used  Substance and Sexual Activity   Alcohol use: No   Drug use: Not Currently    Types: Marijuana    Comment: none since ine her 21s   Sexual activity: Yes    Partners: Male    Birth control/protection: Surgical, I.U.D.    Comment: tubal  Other Topics Concern   Not on file  Social History Narrative   Works at [employer].    Social Determinants of Health   Financial Resource Strain: High Risk (05/28/2022)   Overall Financial Resource Strain (CARDIA)    Difficulty of Paying Living Expenses: Hard  Food Insecurity: Food Insecurity Present (05/28/2022)   Hunger Vital Sign    Worried About Running Out of Food in the Last Year: Sometimes true    Ran Out of Food in the Last Year: Sometimes true  Transportation Needs: No Transportation Needs (09/28/2021)   PRAPARE - Hydrologist (Medical): No    Lack of Transportation (Non-Medical): No  Physical Activity: Inactive (05/28/2022)   Exercise Vital Sign    Days of Exercise per Week: 0 days    Minutes of Exercise  per Session: 0 min  Stress: Stress Concern Present (05/28/2022)   [place]    Feeling of Stress : Rather much  Social Connections: Moderately Isolated (05/28/2022)   Social Connection and Isolation Panel [NHANES]    Frequency of Communication with Friends and Family: More than three times a week    Frequency of Social Gatherings with Friends and Family: Once a week    Attends Religious Services: 1 to 4 times per year    Active Member of [employer] or Organizations: No    Attends Archivist Meetings: Never    Marital Status: Separated  Intimate Partner Violence: Not At Risk (05/28/2022)   Humiliation, Afraid, Rape, and Kick questionnaire    Fear of Current or Ex-Partner: No    Emotionally Abused: No    Physically Abused: No    Sexually Abused: No    ROS Review of Systems   Constitutional:  Negative for chills and fever.  Eyes:  Negative for visual disturbance.  Respiratory:  Negative for chest tightness and shortness of breath.   Musculoskeletal:  Positive for back pain.       Pain and swelling of the left thumb  Neurological:  Negative for dizziness and headaches.  Psychiatric/Behavioral:  Negative for self-injury and suicidal ideas.     Objective:   Today's Vitals: BP 138/88   Pulse 88   Ht '4\' 11"'$  (1.499 m)   Wt 151 lb 2.9 oz (68.6 kg)   SpO2 98%   BMI 30.53 kg/m   Physical Exam HENT:     Head: Normocephalic.     Mouth/Throat:     Mouth: Mucous membranes are moist.  Cardiovascular:     Rate and Rhythm: Normal rate.     Heart sounds: Normal heart sounds.  Pulmonary:     Effort: Pulmonary effort is normal.     Breath sounds: Normal breath sounds.  Musculoskeletal:     Right hand: No swelling, deformity or tenderness. Normal range of motion. Normal strength. Normal pulse.     Left hand: No swelling, deformity or tenderness. Normal range of motion. Normal strength.     Lumbar back: No deformity or tenderness. Normal range of motion.     Comments: Negative straight leg raise test No pain with palpation of the lumbar musculature No CVA tenderness   Neurological:     Mental Status: She is alert.      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 Encouraged proper bending and lifting techniques Complains of lumbar back pain with sciatica Denies any specific injury to the back other than working   Denies any dysuria or hematuria, no saddle anesthesia, no bilateral lower extremity weakness, no history of cancer, no previous back surgeries, no IV drug use history, no chest pain or shortness of breath Encourage the use of a back brace while at work Encouraged to take Zanaflex 4 mg as needed Encouraged use of  over-the-counter analgesic Encouraged alternating with cold and heat therapy at the lower back    Orders: -     tiZANidine HCl; Take 1 capsule (4 mg total) by mouth 3 (three) times daily.  Dispense: 30 capsule; Refill: 1  Depression, recurrent ([place]) Assessment & Plan: Denies suicidal thoughts and ideation Ongoing symptoms for more than a year Will like to start pharmacological therapy today  Encouraged to start taking Zoloft 25 mg daily Referral placed to integrated behavioral health for talk therapy  Orders: -     Sertraline HCl; Take 1 tablet (25 mg total) by mouth daily.  Dispense: 30 tablet; Refill: 3 -     Amb ref to [facility]  Chronic thumb pain, left Assessment & Plan: Range of motion of the right thumb is intact No evidence of inflammation reported or noted on physical examination She complains of occasional pain and  swelling of the right thumb Onset of symptoms 6 months ago No injury or trauma reported Reports family history of arthritis Encouraged to continue conservative management Will obtain imaging studies today    Orders: -     DG Hand Complete Left  Vitamin D deficiency -     VITAMIN D 25 Hydroxy (Vit-D Deficiency, Fractures)  Impaired fasting blood sugar -     Hemoglobin A1c  Other specified disorders of thyroid  Other hyperlipidemia -     CBC with Differential/Platelet -     CMP14+EGFR -     Lipid panel -     TSH + free T4  Encounter for hepatitis C screening test for low risk patient -     Hepatitis C antibody  Encounter for screening for HIV -     HIV Antibody (routine testing w rflx)     Follow-up: Return in about 6 weeks (around 07/26/2022).   Alvira Monday, FNP

## 2022-06-14 NOTE — Patient Instructions (Addendum)
I appreciate the opportunity to provide care to you today!    Follow up:  6 weeks  Labs: please stop by the lab today to get your blood drawn (CBC, CMP, TSH, Lipid profile, HgA1c, Vit D)  Screening: HIV and Hep C  Low back pain Recommended proper bending and lifting techniques, bend at your knees, bilateral ways of back I recommend using a back brace to maintain a good back posture while at work. I recommend alternating with heat and cold therapy at your lower back Please start taking Zanaflex 4 mg 3 times daily as needed for muscle spasms and pain I recommend taking Tylenol over-the-counter for pain relief Apply heat to the affected area such as a moist heat pack or a heating pad. Place a towel between your skin and the heat source. Leave the heat on for 20-30 minutes. Remove the heat if your skin turns bright red. This is especially important if you are unable to feel pain, heat, or cold. You may have a greater risk of getting burned. Apply  ice on the painful area. To do this: If you have a removable splint, remove it as told by your health care provider. Put ice in a plastic bag. Place a towel between your skin and the bag or between your splint and the bag. Leave the ice on for 20 minutes, 2-3 times a day.    Depression Please start taking zoloft 25 mg daily for depression A referral has been placed to integrated behavioral health for talk therapy Take at the same time each day, with or without food May take at least 2 weeks for benefit, and 6-8 weeks for full effect Do not abruptly discontinue to avoid withdrawal symptoms   Right thumb swelling Will get an x-ray of your left thumb I recommend decreasing your sodium intake to help with swelling     Please continue to a heart-healthy diet and increase your physical activities. Try to exercise for 74mns at least five times a week.      It was a pleasure to see you and I look forward to continuing to work together on your  health and well-being. Please do not hesitate to call the office if you need care or have questions about your care.   Have a wonderful day and week. With Gratitude, GAlvira MondayMSN, FNP-BC

## 2022-06-15 LAB — CMP14+EGFR
ALT: 18 IU/L (ref 0–32)
AST: 21 IU/L (ref 0–40)
Albumin/Globulin Ratio: 1.7 (ref 1.2–2.2)
Albumin: 4.8 g/dL (ref 3.9–4.9)
Alkaline Phosphatase: 53 IU/L (ref 44–121)
BUN/Creatinine Ratio: 13 (ref 9–23)
BUN: 10 mg/dL (ref 6–24)
Bilirubin Total: 0.4 mg/dL (ref 0.0–1.2)
CO2: 23 mmol/L (ref 20–29)
Calcium: 9.8 mg/dL (ref 8.7–10.2)
Chloride: 102 mmol/L (ref 96–106)
Creatinine, Ser: 0.76 mg/dL (ref 0.57–1.00)
Globulin, Total: 2.9 g/dL (ref 1.5–4.5)
Glucose: 88 mg/dL (ref 70–99)
Potassium: 4.1 mmol/L (ref 3.5–5.2)
Sodium: 141 mmol/L (ref 134–144)
Total Protein: 7.7 g/dL (ref 6.0–8.5)
eGFR: 98 mL/min/{1.73_m2} (ref >59)

## 2022-06-15 LAB — LIPID PANEL
Chol/HDL Ratio: 3.2 ratio (ref 0.0–4.4)
Cholesterol, Total: 168 mg/dL (ref 100–199)
HDL: 53 mg/dL (ref >39)
LDL Chol Calc (NIH): 104 mg/dL — ABNORMAL HIGH (ref 0–99)
Triglycerides: 53 mg/dL (ref 0–149)
VLDL Cholesterol Cal: 11 mg/dL (ref 5–40)

## 2022-06-15 LAB — CBC WITH DIFFERENTIAL/PLATELET
Basophils Absolute: 0 10*3/uL (ref 0.0–0.2)
Basos: 0 %
EOS (ABSOLUTE): 0 10*3/uL (ref 0.0–0.4)
Eos: 1 %
Hematocrit: 44.9 % (ref 34.0–46.6)
Hemoglobin: 14.2 g/dL (ref 11.1–15.9)
Immature Grans (Abs): 0 10*3/uL (ref 0.0–0.1)
Immature Granulocytes: 0 %
Lymphocytes Absolute: 1.8 10*3/uL (ref 0.7–3.1)
Lymphs: 40 %
MCH: 27.1 pg (ref 26.6–33.0)
MCHC: 31.6 g/dL (ref 31.5–35.7)
MCV: 86 fL (ref 79–97)
Monocytes Absolute: 0.3 10*3/uL (ref 0.1–0.9)
Monocytes: 7 %
Neutrophils Absolute: 2.4 10*3/uL (ref 1.4–7.0)
Neutrophils: 52 %
Platelets: 227 10*3/uL (ref 150–450)
RBC: 5.24 x10E6/uL (ref 3.77–5.28)
RDW: 13.3 % (ref 11.7–15.4)
WBC: 4.6 10*3/uL (ref 3.4–10.8)

## 2022-06-15 LAB — TSH+FREE T4
Free T4: 1.19 ng/dL (ref 0.82–1.77)
TSH: 0.659 u[IU]/mL (ref 0.450–4.500)

## 2022-06-15 LAB — HEMOGLOBIN A1C
Est. average glucose Bld gHb Est-mCnc: 114 mg/dL
Hgb A1c MFr Bld: 5.6 % (ref 4.8–5.6)

## 2022-06-15 LAB — HIV ANTIBODY (ROUTINE TESTING W REFLEX): HIV Screen 4th Generation wRfx: NONREACTIVE

## 2022-06-15 LAB — VITAMIN D 25 HYDROXY (VIT D DEFICIENCY, FRACTURES): Vit D, 25-Hydroxy: 63.6 ng/mL (ref 30.0–100.0)

## 2022-06-15 LAB — HEPATITIS C ANTIBODY: Hep C Virus Ab: NONREACTIVE

## 2022-06-16 DIAGNOSIS — M5440 Lumbago with sciatica, unspecified side: Secondary | ICD-10-CM | POA: Insufficient documentation

## 2022-06-16 DIAGNOSIS — G8929 Other chronic pain: Secondary | ICD-10-CM | POA: Insufficient documentation

## 2022-06-16 DIAGNOSIS — F339 Major depressive disorder, recurrent, unspecified: Secondary | ICD-10-CM | POA: Insufficient documentation

## 2022-06-16 NOTE — Assessment & Plan Note (Addendum)
Range of motion of the left thumb is intact No evidence of inflammation reported or noted on physical examination She complains of occasional pain and  swelling of the right thumb Onset of symptoms 6 months ago No injury or trauma reported Reports family history of arthritis Encouraged to continue conservative management Will obtain imaging studies today

## 2022-06-16 NOTE — Assessment & Plan Note (Signed)
Denies suicidal thoughts and ideation Ongoing symptoms for more than a year Will like to start pharmacological therapy today Encouraged to start taking Zoloft 25 mg daily Referral placed to integrated behavioral health for talk therapy

## 2022-06-16 NOTE — Assessment & Plan Note (Signed)
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

## 2022-06-18 ENCOUNTER — Inpatient Hospital Stay (HOSPITAL_COMMUNITY): Admission: RE | Admit: 2022-06-18 | Payer: Medicaid Other | Source: Ambulatory Visit

## 2022-06-18 ENCOUNTER — Other Ambulatory Visit: Payer: Self-pay

## 2022-06-18 ENCOUNTER — Ambulatory Visit (HOSPITAL_COMMUNITY)
Admission: RE | Admit: 2022-06-18 | Discharge: 2022-06-18 | Disposition: A | Discharge status: Home / Self Care | Payer: Medicaid Other | Source: Ambulatory Visit | Attending: Family Medicine | Admitting: Family Medicine

## 2022-06-18 DIAGNOSIS — M79645 Pain in left finger(s): Secondary | ICD-10-CM | POA: Diagnosis not present

## 2022-06-18 DIAGNOSIS — G8929 Other chronic pain: Secondary | ICD-10-CM | POA: Diagnosis not present

## 2022-06-18 DIAGNOSIS — M19041 Primary osteoarthritis, right hand: Secondary | ICD-10-CM | POA: Diagnosis not present

## 2022-06-21 IMAGING — MG MM DIGITAL SCREENING BILAT W/ TOMO AND CAD
6 of 10 series · 6 of 30 positions shown · non-contrast
Comparison: Previous exam(s).

CLINICAL DATA: Screening.

EXAM:
DIGITAL SCREENING BILATERAL MAMMOGRAM WITH TOMOSYNTHESIS AND CAD
TECHNIQUE: Bilateral screening digital craniocaudal and mediolateral oblique
mammograms were obtained. Bilateral screening digital breast
tomosynthesis was performed. The images were evaluated with
computer-aided detection.

[L MLO synth-2D]
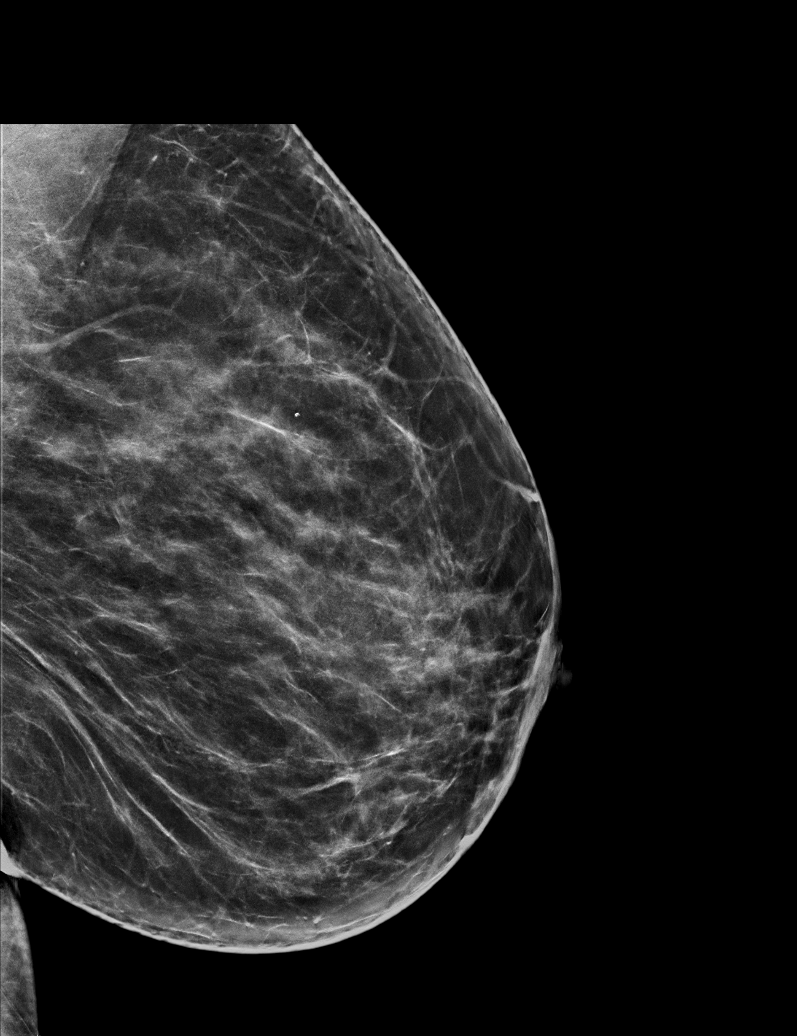

[R MLO synth-2D (1 of 2)]
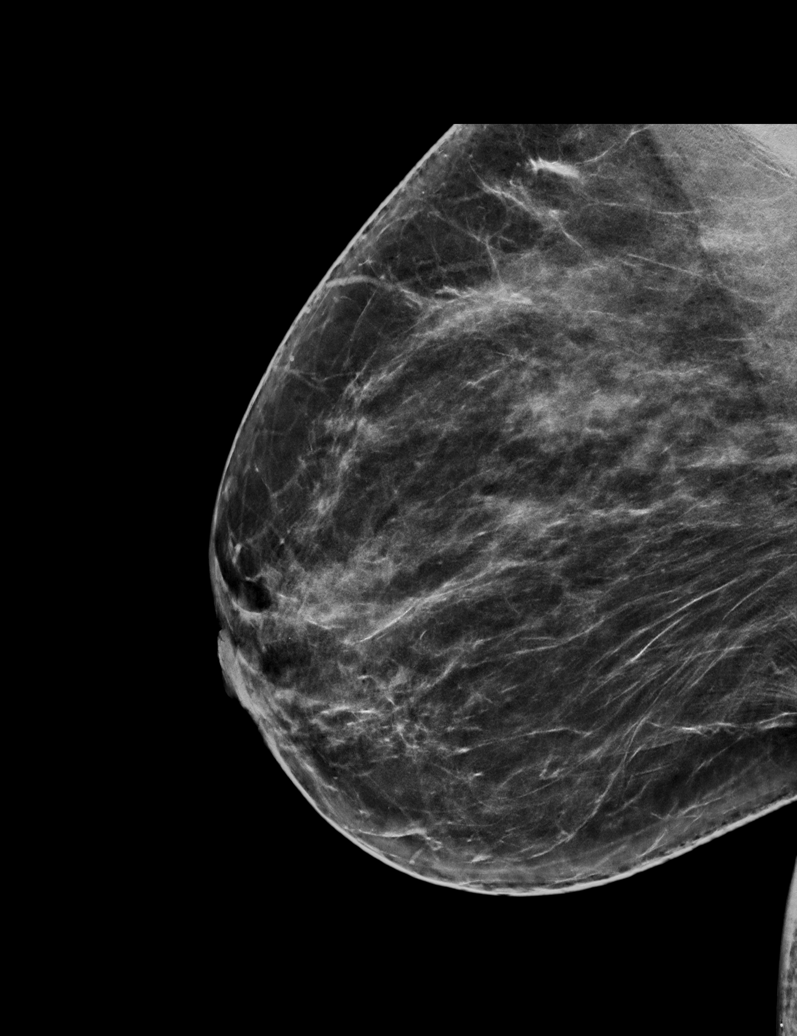

[L CC synth-2D]
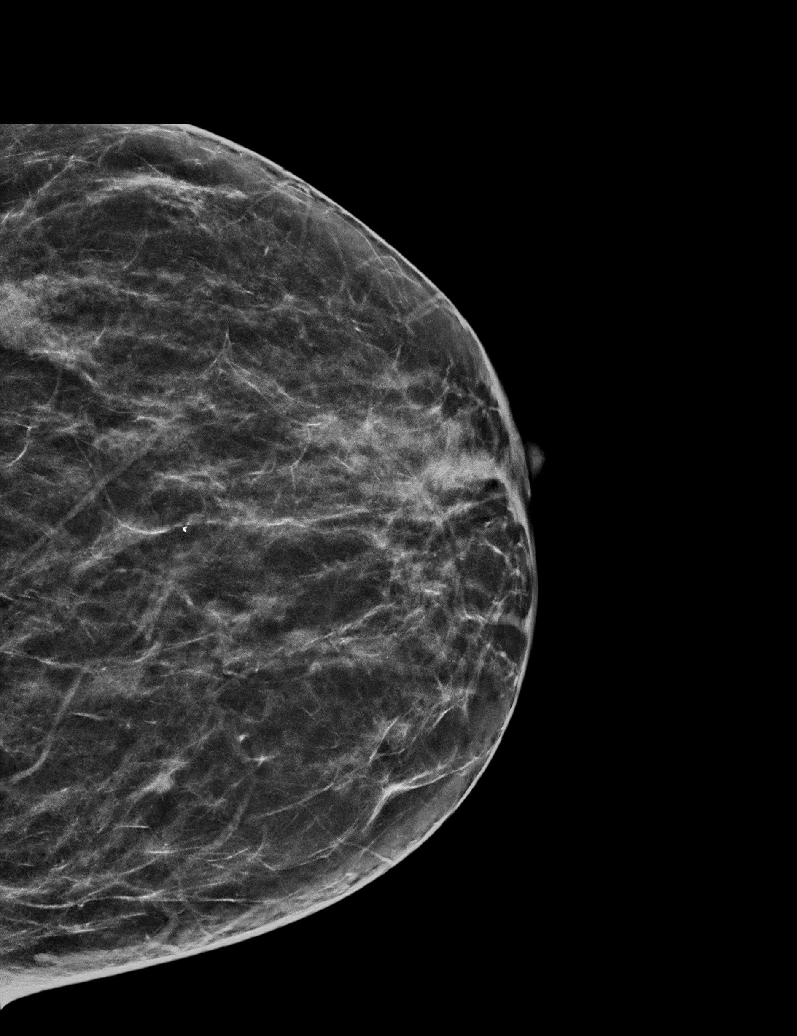

[R CC synth-2D]
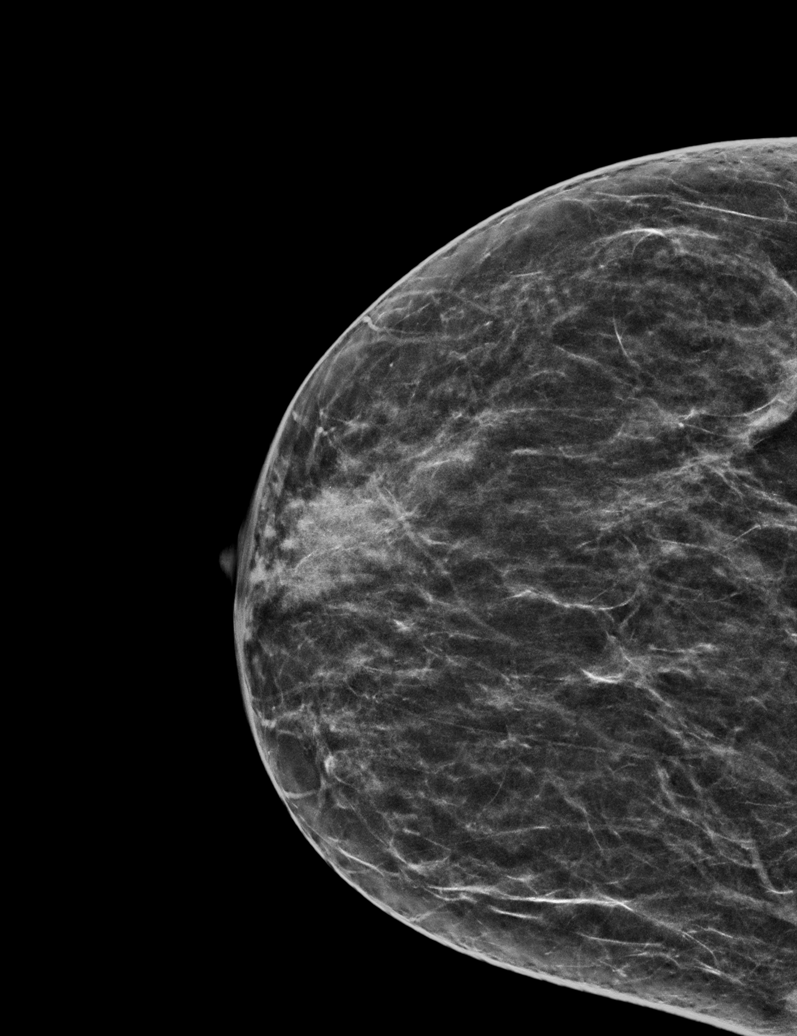

[R MLO synth-2D (2 of 2)]
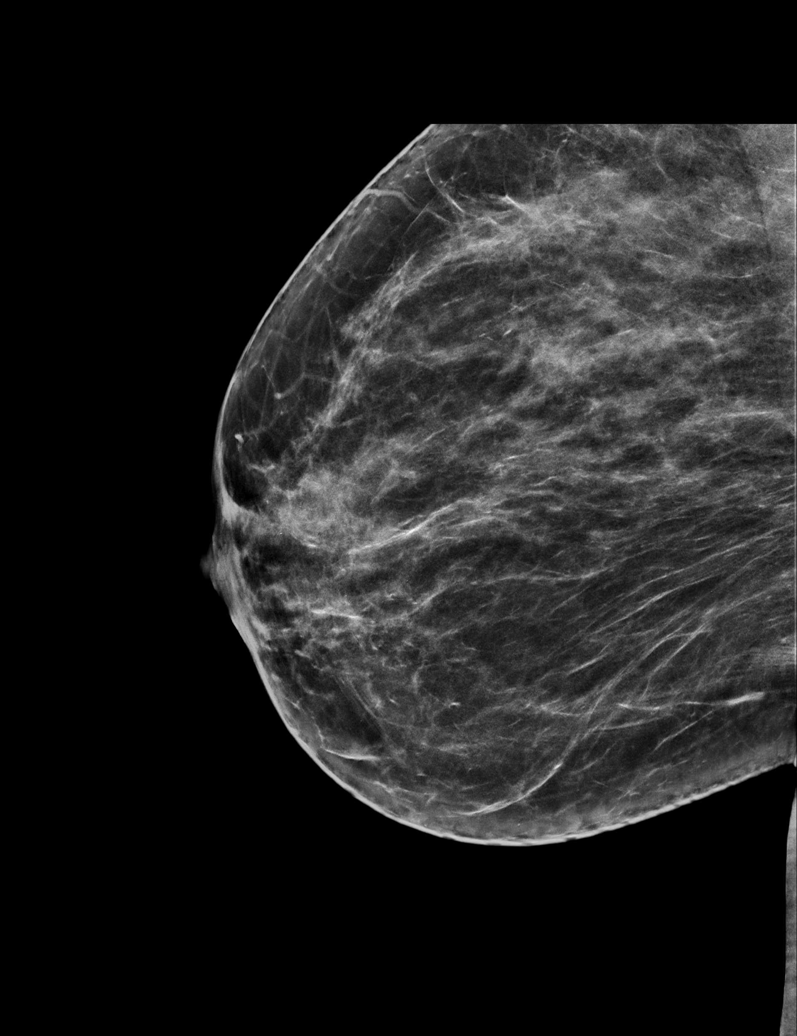

[L MLO tomo · tomo slice 35/70.0]
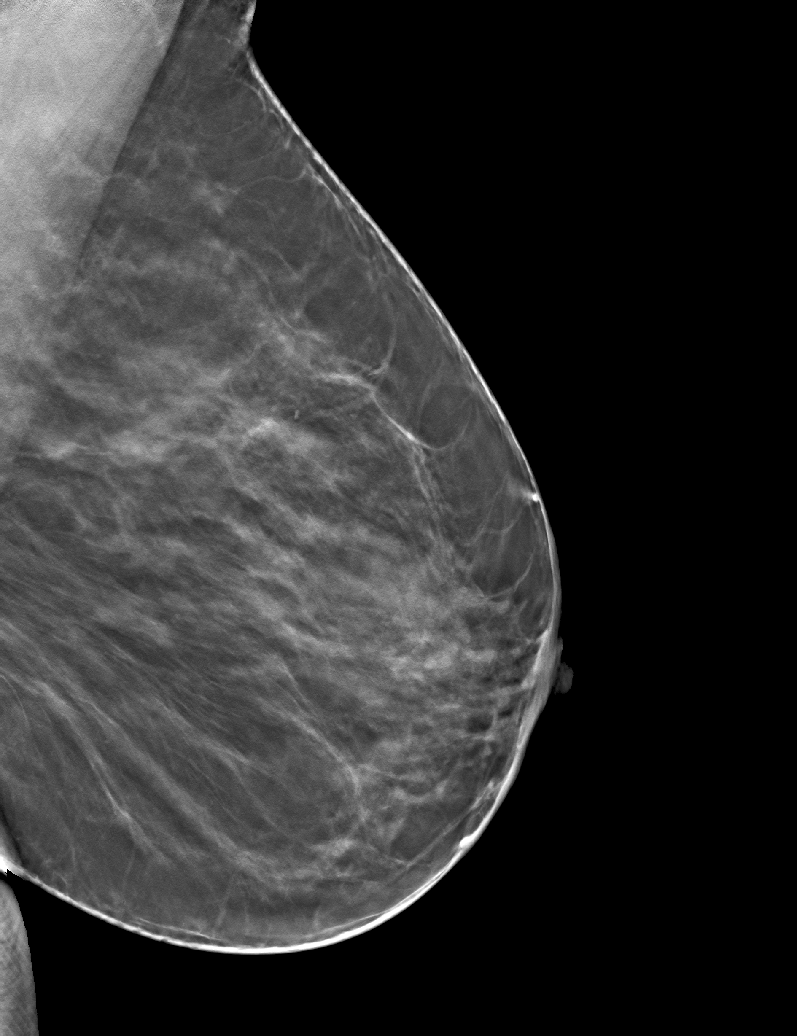

[6 of 30 positions shown; findings below may reference images not displayed]

ACR Breast Density Category b: There are scattered areas of
fibroglandular density.
FINDINGS: There are no findings suspicious for malignancy.
IMPRESSION: No mammographic evidence of malignancy. A result letter of this
screening mammogram will be mailed directly to the patient.

RECOMMENDATION:
Screening mammogram in one year. (Code:51-O-LD2)

BI-RADS CATEGORY  1: Negative.

## 2022-06-26 ENCOUNTER — Ambulatory Visit (INDEPENDENT_AMBULATORY_CARE_PROVIDER_SITE_OTHER): Payer: Medicaid Other | Admitting: Licensed Clinical Social Worker

## 2022-06-26 DIAGNOSIS — F419 Anxiety disorder, unspecified: Secondary | ICD-10-CM

## 2022-06-27 NOTE — BH Specialist Note (Signed)
Integrated Behavioral Health via Telemedicine Visit  06/27/2022 [name] [id number]  Number of [place] Clinician visits: 1 Session Start time:  1015am Session End time: 1055am Total time in minutes: 40 mins via mychart video   Referring Provider: Verne Grain NP Patient/Family location: Home  [facility] Provider location: [facility]  All persons participating in visit: Pt [name] and LCSW [name]  Types of Service: Individual psychotherapy and Video visit  I connected with [name] and/or [name] n/Mikayla via  Telephone or Video Enabled Telemedicine Application  (Video is Caregility application) and verified that I am speaking with the correct person using two identifiers. Discussed confidentiality: Yes   I discussed the limitations of telemedicine and the availability of in person appointments.  Discussed there is Mikayla possibility of technology failure and discussed alternative modes of communication if that failure occurs.  I discussed that engaging in this telemedicine visit, they consent to the provision of behavioral healthcare and the services will be billed under their insurance.  Patient and/or legal guardian expressed understanding and consented to Telemedicine visit: Yes   Presenting Concerns: Patient and/or family reports the following symptoms/concerns: anxiety Duration of problem: over one year ; Severity of problem: mild  Patient and/or Family's Strengths/Protective Factors: Concrete supports in place (healthy food, safe environments, etc.)  Goals Addressed: Patient will:  Reduce symptoms of: anxiety   Increase knowledge and/or ability of: coping skills   Demonstrate ability to: Increase healthy adjustment to current life circumstances  Progress towards Goals: Ongoing  Interventions: Interventions utilized:  Supportive Counseling Standardized Assessments completed: Not Needed  Patient and/or Family Response: [name] reports excessive worry  and anxious mood most days. [name] and LCSW [name] discussed documenting pertinent information, discussing concerns with trusted close individual and engaging in self care   Assessment: Patient currently experiencing anxiety.   Patient may benefit from integrated behavioral health.  Plan: Follow up with behavioral health clinician on : as needed Behavioral recommendations: documenting and prioritizing tasks and pertinent information, self care and focusing on tasks  that is manageable  Referral(s): [facility] (In Clinic)  I discussed the assessment and treatment plan with the patient and/or parent/guardian. They were provided an opportunity to ask questions and all were answered. They agreed with the plan and demonstrated an understanding of the instructions.   They were advised to call back or seek an in-person evaluation if the symptoms worsen or if the condition fails to improve as anticipated.  Mikayla Ferrier, LCSW

## 2022-07-02 ENCOUNTER — Other Ambulatory Visit (HOSPITAL_COMMUNITY)
Admission: RE | Admit: 2022-07-02 | Discharge: 2022-07-02 | Disposition: A | Discharge status: Home / Self Care | Payer: Medicaid Other | Source: Ambulatory Visit | Attending: Internal Medicine | Admitting: Internal Medicine

## 2022-07-02 DIAGNOSIS — Z1211 Encounter for screening for malignant neoplasm of colon: Secondary | ICD-10-CM | POA: Diagnosis not present

## 2022-07-02 LAB — PREGNANCY, URINE: Preg Test, Ur: NEGATIVE

## 2022-07-04 ENCOUNTER — Encounter (HOSPITAL_COMMUNITY): Payer: Self-pay

## 2022-07-04 ENCOUNTER — Ambulatory Visit (HOSPITAL_COMMUNITY): Payer: Medicaid Other | Admitting: Anesthesiology

## 2022-07-04 ENCOUNTER — Other Ambulatory Visit: Payer: Self-pay

## 2022-07-04 ENCOUNTER — Ambulatory Visit (HOSPITAL_BASED_OUTPATIENT_CLINIC_OR_DEPARTMENT_OTHER): Payer: Medicaid Other | Admitting: Anesthesiology

## 2022-07-04 ENCOUNTER — Encounter (HOSPITAL_COMMUNITY)
Admission: RE | Disposition: A | Discharge status: Home / Self Care | Payer: Self-pay | Source: Home / Self Care | Attending: Internal Medicine

## 2022-07-04 ENCOUNTER — Ambulatory Visit (HOSPITAL_COMMUNITY)
Admission: RE | Admit: 2022-07-04 | Discharge: 2022-07-04 | Disposition: A | Discharge status: Home / Self Care | Payer: Medicaid Other | Attending: Internal Medicine | Admitting: Internal Medicine

## 2022-07-04 DIAGNOSIS — D649 Anemia, unspecified: Secondary | ICD-10-CM | POA: Insufficient documentation

## 2022-07-04 DIAGNOSIS — F17200 Nicotine dependence, unspecified, uncomplicated: Secondary | ICD-10-CM | POA: Diagnosis not present

## 2022-07-04 DIAGNOSIS — D122 Benign neoplasm of ascending colon: Secondary | ICD-10-CM | POA: Diagnosis not present

## 2022-07-04 DIAGNOSIS — Z1211 Encounter for screening for malignant neoplasm of colon: Secondary | ICD-10-CM | POA: Diagnosis not present

## 2022-07-04 DIAGNOSIS — F17210 Nicotine dependence, cigarettes, uncomplicated: Secondary | ICD-10-CM | POA: Insufficient documentation

## 2022-07-04 DIAGNOSIS — K635 Polyp of colon: Secondary | ICD-10-CM | POA: Diagnosis not present

## 2022-07-04 DIAGNOSIS — F32A Depression, unspecified: Secondary | ICD-10-CM | POA: Diagnosis not present

## 2022-07-04 HISTORY — PX: POLYPECTOMY: SHX5525

## 2022-07-04 HISTORY — DX: Depression, unspecified: F32.A

## 2022-07-04 HISTORY — PX: COLONOSCOPY WITH PROPOFOL: SHX5780

## 2022-07-04 SURGERY — COLONOSCOPY WITH PROPOFOL
Anesthesia: General

## 2022-07-04 MED ORDER — PROPOFOL 10 MG/ML IV BOLUS
INTRAVENOUS | Status: DC | PRN
  Administered 2022-07-04: 100 mg via INTRAVENOUS
  Administered 2022-07-04 (×3): 50 mg via INTRAVENOUS

## 2022-07-04 MED ORDER — LIDOCAINE HCL (CARDIAC) PF 100 MG/5ML IV SOSY
PREFILLED_SYRINGE | INTRAVENOUS | Status: DC | PRN
  Administered 2022-07-04: 50 mg via INTRAVENOUS

## 2022-07-04 MED ORDER — LACTATED RINGERS IV SOLN: INTRAVENOUS | Status: DC | PRN

## 2022-07-04 MED ORDER — LACTATED RINGERS IV SOLN: INTRAVENOUS | Status: DC

## 2022-07-04 NOTE — Discharge Instructions (Addendum)
  Colonoscopy Discharge Instructions  Read the instructions outlined below and refer to this sheet in the next few weeks. These discharge instructions provide you with general information on caring for yourself after you leave the hospital. Your doctor may also give you specific instructions. While your treatment has been planned according to the most current medical practices available, unavoidable complications occasionally occur.   ACTIVITY You may resume your regular activity, but move at a slower pace for the next 24 hours.  Take frequent rest periods for the next 24 hours.  Walking will help get rid of the air and reduce the bloated feeling in your belly (abdomen).  No driving for 24 hours (because of the medicine (anesthesia) used during the test).   Do not sign any important legal documents or operate any machinery for 24 hours (because of the anesthesia used during the test).  NUTRITION Drink plenty of fluids.  You may resume your normal diet as instructed by your doctor.  Begin with a light meal and progress to your normal diet. Heavy or fried foods are harder to digest and may make you feel sick to your stomach (nauseated).  Avoid alcoholic beverages for 24 hours or as instructed.  MEDICATIONS You may resume your normal medications unless your doctor tells you otherwise.  WHAT YOU CAN EXPECT TODAY Some feelings of bloating in the abdomen.  Passage of more gas than usual.  Spotting of blood in your stool or on the toilet paper.  IF YOU HAD POLYPS REMOVED DURING THE COLONOSCOPY: No aspirin products for 7 days or as instructed.  No alcohol for 7 days or as instructed.  Eat a soft diet for the next 24 hours.  FINDING OUT THE RESULTS OF YOUR TEST Not all test results are available during your visit. If your test results are not back during the visit, make an appointment with your caregiver to find out the results. Do not assume everything is normal if you have not heard from your  caregiver or the medical facility. It is important for you to follow up on all of your test results.  SEEK IMMEDIATE MEDICAL ATTENTION IF: You have more than a spotting of blood in your stool.  Your belly is swollen (abdominal distention).  You are nauseated or vomiting.  You have a temperature over 101.  You have abdominal pain or discomfort that is severe or gets worse throughout the day.   Your colonoscopy revealed 2 polyp(s) which I removed successfully. Await pathology results, my office will contact you. I recommend repeating colonoscopy in 7 years for surveillance purposes.   Otherwise follow up with GI as needed.   I hope you have a great rest of your week!  Charles K. Carver, D.O. Gastroenterology and Hepatology [facility]  

## 2022-07-04 NOTE — Transfer of Care (Signed)
Immediate Anesthesia Transfer of Care Note  Patient: [name]  Procedure(s) Performed: COLONOSCOPY WITH PROPOFOL POLYPECTOMY  Patient Location: Endoscopy Unit  Anesthesia Type:General  Level of Consciousness: awake, alert , and oriented  Airway & Oxygen Therapy: Patient Spontanous Breathing  Post-op Assessment: Report given to RN and Post -op Vital signs reviewed and stable  Post vital signs: Reviewed and stable  Last Vitals:  Vitals Value Taken Time  BP 93/39 07/04/22 1027  Temp 36.5 C 07/04/22 1027  Pulse 78 07/04/22 1027  Resp 18 07/04/22 1027  SpO2 100 % 07/04/22 1027    Last Pain:  Vitals:   07/04/22 1027  TempSrc: Axillary  PainSc: 0-No pain      Patients Stated Pain Goal: 8 ([id number])  Complications: No notable events documented.

## 2022-07-04 NOTE — Op Note (Signed)
[facility] Patient Name: [name] Procedure Date: 07/04/2022 10:03 AM MRN: [id number] Date of Birth: [birth year] Attending MD: Elon Alas. Abbey Chatters , [place], [id number] CSN: [id number] Age: 47 Admit Type: Outpatient Procedure:                Colonoscopy Indications:              Screening for colorectal malignant neoplasm Providers:                Elon Alas. Abbey Chatters, DO, Crystal Page, Rosina Lowenstein,                            RN Referring MD:              Medicines:                See the Anesthesia note for documentation of the                            administered medications Complications:            No immediate complications. Estimated Blood Loss:     Estimated blood loss was minimal. Procedure:                Pre-Anesthesia Assessment:                           - The anesthesia plan was to use monitored                            anesthesia care (MAC).                           After obtaining informed consent, the colonoscope                            was passed under direct vision. Throughout the                            procedure, the patient's blood pressure, pulse, and                            oxygen saturations were monitored continuously. The                            [id number]) scope was introduced through                            the anus and advanced to the the cecum, identified                            by appendiceal orifice and ileocecal valve. The                            colonoscopy was performed without difficulty. The                            patient tolerated the procedure well. The  quality                            of the bowel preparation was evaluated using the                            BBPS [facility] Bowel Preparation Scale) with scores                            of: Right Colon = 3, Transverse Colon = 3 and Left                            Colon = 3 (entire mucosa seen well with no residual                            staining, small  fragments of stool or opaque                            liquid). The total BBPS score equals 9. Scope In: 10:11:32 AM Scope Out: 10:25:02 AM Scope Withdrawal Time: 0 hours 7 minutes 24 seconds  Total Procedure Duration: 0 hours 13 minutes 30 seconds  Findings:      Two sessile polyps were found in the ascending colon. The polyps were 3       to 4 mm in size. These polyps were removed with a cold snare. Resection       and retrieval were complete.      The exam was otherwise without abnormality. Impression:               - Two 3 to 4 mm polyps in the ascending colon,                            removed with a cold snare. Resected and retrieved.                           - The examination was otherwise normal. Moderate Sedation:      Per Anesthesia Care Recommendation:           - Patient has a contact number available for                            emergencies. The signs and symptoms of potential                            delayed complications were discussed with the                            patient. Return to normal activities tomorrow.                            Written discharge instructions were provided to the                            patient.                           -  Resume previous diet.                           - Continue present medications.                           - Await pathology results.                           - Repeat colonoscopy in 7 years for surveillance.                           - Return to GI clinic PRN. Procedure Code(s):        --- Professional ---                           [phone], Colonoscopy, flexible; with removal of                            tumor(s), polyp(s), or other lesion(s) by snare                            technique Diagnosis Code(s):        --- Professional ---                           Z12.11, Encounter for screening for malignant                            neoplasm of colon                           D12.2, Benign neoplasm of  ascending colon CPT copyright 2022 American Medical Association. All rights reserved. The codes documented in this report are preliminary and upon coder review may  be revised to meet current compliance requirements. Elon Alas. Abbey Chatters, DO [place] Abbey Chatters, DO 07/04/2022 10:27:41 AM This report has been signed electronically. Number of Addenda: 0

## 2022-07-04 NOTE — H&P (Signed)
Primary Care Physician:  Mikayla Monday, FNP Primary Gastroenterologist:  Dr. Abbey Chatters  Pre-Procedure History & Physical: HPI:  [name] is a 47 y.o. female is here for first ever colonoscopy for colon cancer screening purposes.  Patient denies any family history of colorectal cancer.  No melena or hematochezia.  No abdominal pain or unintentional weight loss.  No change in bowel habits.  Overall feels well from a GI standpoint.  Past Medical History:  Diagnosis Date   Anemia 05/31/2014   BV (bacterial vaginosis) 06/16/2015   Encounter for smoking cessation counseling 06/16/2015   HSV (herpes simplex virus) infection    Hypertension    Smoker [id number]   Umbilical hernia [id number]   Vaginal discharge 05/30/2014   Vitamin D deficiency 06/19/2015   Yeast infection 05/30/2014    Past Surgical History:  Procedure Laterality Date   CESAREAN SECTION     TUBAL LIGATION      Prior to Admission medications   Medication Sig Start Date End Date Taking? Authorizing Provider  cetirizine (ZYRTEC) 10 MG tablet Take 10 mg by mouth daily.   Yes [provider]  cholecalciferol (VITAMIN D3) 25 MCG (1000 UNIT) tablet Take 2,000 Units by mouth daily.   Yes [provider]  cyclobenzaprine (FLEXERIL) 10 MG tablet Take 10 mg by mouth 3 (three) times daily as needed for muscle spasms. 05/15/22  Yes [provider]  ferrous sulfate 325 (65 FE) MG tablet Take 1 tablet (325 mg total) by mouth 2 (two) times daily with a meal. Patient taking differently: Take 325 mg by mouth daily. 07/08/17  Yes Hagler, Apolonio Schneiders, MD  fluticasone (FLONASE) 50 MCG/ACT nasal spray Place 1 spray into both nostrils daily. 05/15/22  Yes [provider]  levonorgestrel (MIRENA) 20 MCG/DAY IUD 1 each by Intrauterine route once. (Inserted 2021)   Yes [provider]  Multiple Vitamins-Minerals (ONE-A-DAY WOMENS PO) Take 1 tablet by mouth daily.   Yes [provider]  tiZANidine (ZANAFLEX) 4  MG capsule Take 1 capsule (4 mg total) by mouth 3 (three) times daily. Patient taking differently: Take 4 mg by mouth 3 (three) times daily as needed for muscle spasms. 06/14/22  Yes Mikayla Monday, FNP  vitamin C (ASCORBIC ACID) 500 MG tablet Take 500 mg by mouth daily.   Yes [provider]  sertraline (ZOLOFT) 25 MG tablet Take 1 tablet (25 mg total) by mouth daily. 06/14/22   Mikayla Monday, FNP    Allergies as of 06/11/2022 - Review Complete 05/28/2022  Allergen Reaction Noted   Lisinopril Cough 04/20/2019    Family History  Problem Relation Age of Onset   Alzheimer's disease Maternal Grandmother    Cancer Maternal Grandfather    Kidney disease Father    Hypertension Mother    Hypertension Brother    Sickle cell anemia Brother    Other Brother        born with fluid on brain   Asthma Son     Social History   Socioeconomic History   Marital status: Legally Separated    Spouse name: Not on file   Number of children: 3   Years of education: Not on file   Highest education level: 10th grade  Occupational History   Not on file  Tobacco Use   Smoking status: Every Day    Packs/day: 0.50    Years: 18.00    Additional pack years: 0.00    Total pack years: 9.00    Types: Cigarettes  Smokeless tobacco: Never   Tobacco comments:    1/2 a day.  Vaping Use   Vaping Use: Never used  Substance and Sexual Activity   Alcohol use: No   Drug use: Not Currently    Types: Marijuana    Comment: none since ine her 2s   Sexual activity: Yes    Partners: Male    Birth control/protection: Surgical, I.U.D.    Comment: tubal  Other Topics Concern   Not on file  Social History Narrative   Works at [employer].    Social Determinants of Health   Financial Resource Strain: High Risk (05/28/2022)   Overall Financial Resource Strain (CARDIA)    Difficulty of Paying Living Expenses: Hard  Food Insecurity: Food Insecurity Present (05/28/2022)   Hunger Vital Sign    Worried About  Running Out of Food in the Last Year: Sometimes true    Ran Out of Food in the Last Year: Sometimes true  Transportation Needs: No Transportation Needs (09/28/2021)   PRAPARE - Hydrologist (Medical): No    Lack of Transportation (Non-Medical): No  Physical Activity: Inactive (05/28/2022)   Exercise Vital Sign    Days of Exercise per Week: 0 days    Minutes of Exercise per Session: 0 min  Stress: Stress Concern Present (05/28/2022)   [place]    Feeling of Stress : Rather much  Social Connections: Moderately Isolated (05/28/2022)   Social Connection and Isolation Panel [NHANES]    Frequency of Communication with Friends and Family: More than three times a week    Frequency of Social Gatherings with Friends and Family: Once a week    Attends Religious Services: 1 to 4 times per year    Active Member of [employer] or Organizations: No    Attends Archivist Meetings: Never    Marital Status: Separated  Intimate Partner Violence: Not At Risk (05/28/2022)   Humiliation, Afraid, Rape, and Kick questionnaire    Fear of Current or Ex-Partner: No    Emotionally Abused: No    Physically Abused: No    Sexually Abused: No    Review of Systems: See HPI, otherwise negative ROS  Physical Exam: Vital signs in last 24 hours:     General:   Alert,  Well-developed, well-nourished, pleasant and cooperative in NAD Head:  Normocephalic and atraumatic. Eyes:  Sclera clear, no icterus.   Conjunctiva pink. Ears:  Normal auditory acuity. Nose:  No deformity, discharge,  or lesions. Msk:  Symmetrical without gross deformities. Normal posture. Extremities:  Without clubbing or edema. Neurologic:  Alert and  oriented x4;  grossly normal neurologically. Skin:  Intact without significant lesions or rashes. Psych:  Alert and cooperative. Normal mood and affect.  Impression/Plan: [name] is here for a  colonoscopy to be performed for colon cancer screening purposes.  The risks of the procedure including infection, bleed, or perforation as well as benefits, limitations, alternatives and imponderables have been reviewed with the patient. Questions have been answered. All parties agreeable.

## 2022-07-04 NOTE — Anesthesia Preprocedure Evaluation (Signed)
Anesthesia Evaluation  Patient identified by MRN, date of birth, ID band Patient awake    Reviewed: Allergy & Precautions, H&P , NPO status , Patient's Chart, lab work & pertinent test results, reviewed documented beta blocker date and time   Airway Mallampati: II  TM Distance: >3 FB Neck ROM: full    Dental no notable dental hx.    Pulmonary neg pulmonary ROS, Current Smoker and Patient abstained from smoking.   Pulmonary exam normal breath sounds clear to auscultation       Cardiovascular Exercise Tolerance: Good hypertension, negative cardio ROS  Rhythm:regular Rate:Normal     Neuro/Psych  PSYCHIATRIC DISORDERS  Depression     Neuromuscular disease negative neurological ROS  negative psych ROS   GI/Hepatic negative GI ROS, Neg liver ROS,,,  Endo/Other  negative endocrine ROS    Renal/GU negative Renal ROS  negative genitourinary   Musculoskeletal   Abdominal   Peds  Hematology negative hematology ROS (+) Blood dyscrasia, anemia   Anesthesia Other Findings   Reproductive/Obstetrics negative OB ROS                             Anesthesia Physical Anesthesia Plan  ASA: 2  Anesthesia Plan: General   Post-op Pain Management:    Induction:   PONV Risk Score and Plan: Propofol infusion  Airway Management Planned:   Additional Equipment:   Intra-op Plan:   Post-operative Plan:   Informed Consent: I have reviewed the patients History and Physical, chart, labs and discussed the procedure including the risks, benefits and alternatives for the proposed anesthesia with the patient or authorized representative who has indicated his/her understanding and acceptance.     Dental Advisory Given  Plan Discussed with: CRNA  Anesthesia Plan Comments:        Anesthesia Quick Evaluation

## 2022-07-05 LAB — SURGICAL PATHOLOGY

## 2022-07-05 NOTE — Anesthesia Postprocedure Evaluation (Signed)
Anesthesia Post Note  Patient: [name]  Procedure(s) Performed: COLONOSCOPY WITH PROPOFOL POLYPECTOMY  Patient location during evaluation: Phase II Anesthesia Type: General Level of consciousness: awake Pain management: pain level controlled Vital Signs Assessment: post-procedure vital signs reviewed and stable Respiratory status: spontaneous breathing and respiratory function stable Cardiovascular status: blood pressure returned to baseline and stable Postop Assessment: no headache and no apparent nausea or vomiting Anesthetic complications: no Comments: Late entry   No notable events documented.   Last Vitals:  Vitals:   07/04/22 1027 07/04/22 1030  BP: (!) 93/39 (!) 91/55  Pulse: 78 76  Resp: 18 19  Temp: 36.5 C   SpO2: 100% 100%    Last Pain:  Vitals:   07/04/22 1035  TempSrc:   PainSc: 0-No pain                 Louann Sjogren

## 2022-07-10 ENCOUNTER — Telehealth: Payer: Self-pay | Admitting: Licensed Clinical Social Worker

## 2022-07-10 ENCOUNTER — Encounter: Payer: Medicaid Other | Admitting: Licensed Clinical Social Worker

## 2022-07-10 NOTE — Telephone Encounter (Signed)
Called pt unable to leave voicemail regarding scheduled mychart visit. Sent mychart link via text and email. Pt no show visit.

## 2022-07-15 ENCOUNTER — Encounter (HOSPITAL_COMMUNITY): Payer: Self-pay | Admitting: Internal Medicine

## 2022-07-24 ENCOUNTER — Other Ambulatory Visit (HOSPITAL_COMMUNITY): Payer: Self-pay | Admitting: Family Medicine

## 2022-07-24 DIAGNOSIS — Z1231 Encounter for screening mammogram for malignant neoplasm of breast: Secondary | ICD-10-CM

## 2022-08-02 ENCOUNTER — Encounter: Payer: Self-pay | Admitting: Family Medicine

## 2022-08-02 ENCOUNTER — Ambulatory Visit: Payer: Medicaid Other | Admitting: Family Medicine

## 2022-08-02 VITALS — BP 126/84 | HR 74 | Ht 59.0 in | Wt 147.1 lb

## 2022-08-02 DIAGNOSIS — M21611 Bunion of right foot: Secondary | ICD-10-CM | POA: Diagnosis not present

## 2022-08-02 DIAGNOSIS — M25551 Pain in right hip: Secondary | ICD-10-CM

## 2022-08-02 MED ORDER — NAPROXEN 500 MG PO TABS: 500.0000 mg | ORAL_TABLET | Freq: Two times a day (BID) | ORAL | 0 refills | Status: AC

## 2022-08-02 NOTE — Progress Notes (Signed)
Established Patient Office Visit  Subjective:  Patient ID: [name], female    DOB: 1975/12/10  Age: 47 y.o. MRN: [id number]  CC:  Chief Complaint  Patient presents with   Follow-up    Following up, pt reports right hip pain, when she moves a certain way it hurts ongoing since 07/29/2022. Pt also reports right foot pain thinks its a bunion noticed 05/03/2022.     HPI [name] is a 47 y.o. female presents with complaints of right hip pain and right foot pain. For the details of today's visit, please refer to the assessment and plan.  Right hip Pain: c/o of right lateral hip pain. No recent injury or trauma reports. Pain is Not radiating. No systemic symptoms were reported. Gait is intact. Pain is elicited when leaning to the left. Pain is rated 4-5 out of 10.   Bunion of right foot: The patient complains of right foot pain during ambulation. Pressure elicits pain. The right foot's range of motion is intact.  No limitation in ambulation.  No signs of inflammation are noted.     Past Medical History:  Diagnosis Date   Anemia 05/31/2014   BV (bacterial vaginosis) 06/16/2015   Depression    Encounter for smoking cessation counseling 06/16/2015   HSV (herpes simplex virus) infection    Hypertension    Smoker 06/16/2015   Umbilical hernia 06/16/2015   Vaginal discharge 05/30/2014   Vitamin D deficiency 06/19/2015   Yeast infection 05/30/2014    Past Surgical History:  Procedure Laterality Date   CESAREAN SECTION     COLONOSCOPY WITH PROPOFOL N/A 07/04/2022   Procedure: COLONOSCOPY WITH PROPOFOL;  Surgeon: Lanelle Bal, DO;  Location: [facility];  Service: Endoscopy;  Laterality: N/A;  11;00 am, asa 2   POLYPECTOMY  07/04/2022   Procedure: POLYPECTOMY;  Surgeon: Lanelle Bal, DO;  Location: [facility];  Service: Endoscopy;;   TUBAL LIGATION      Family History  Problem Relation Age of Onset   Alzheimer's disease Maternal Grandmother    Cancer Maternal  Grandfather    Kidney disease Father    Hypertension Mother    Hypertension Brother    Sickle cell anemia Brother    Other Brother        born with fluid on brain   Asthma Son     Social History   Socioeconomic History   Marital status: Legally Separated    Spouse name: Not on file   Number of children: 3   Years of education: Not on file   Highest education level: 10th grade  Occupational History   Not on file  Tobacco Use   Smoking status: Every Day    Packs/day: 0.50    Years: 18.00    Additional pack years: 0.00    Total pack years: 9.00    Types: Cigarettes   Smokeless tobacco: Never   Tobacco comments:    1/2 a day.  Vaping Use   Vaping Use: Never used  Substance and Sexual Activity   Alcohol use: No   Drug use: Not Currently    Types: Marijuana    Comment: none since ine her 14s   Sexual activity: Yes    Partners: Male    Birth control/protection: Surgical, I.U.D.    Comment: tubal  Other Topics Concern   Not on file  Social History Narrative   Works at [employer].    Social Determinants of Health   Financial  Resource Strain: High Risk (05/28/2022)   Overall Financial Resource Strain (CARDIA)    Difficulty of Paying Living Expenses: Hard  Food Insecurity: Food Insecurity Present (05/28/2022)   Hunger Vital Sign    Worried About Running Out of Food in the Last Year: Sometimes true    Ran Out of Food in the Last Year: Sometimes true  Transportation Needs: No Transportation Needs (09/28/2021)   PRAPARE - Administrator, Civil Service (Medical): No    Lack of Transportation (Non-Medical): No  Physical Activity: Inactive (05/28/2022)   Exercise Vital Sign    Days of Exercise per Week: 0 days    Minutes of Exercise per Session: 0 min  Stress: Stress Concern Present (05/28/2022)   [employer] of Occupational Health - Occupational Stress Questionnaire    Feeling of Stress : Rather much  Social Connections: Moderately Isolated (05/28/2022)   Social  Connection and Isolation Panel [NHANES]    Frequency of Communication with Friends and Family: More than three times a week    Frequency of Social Gatherings with Friends and Family: Once a week    Attends Religious Services: 1 to 4 times per year    Active Member of [employer] or Organizations: No    Attends Banker Meetings: Never    Marital Status: Separated  Intimate Partner Violence: Not At Risk (05/28/2022)   Humiliation, Afraid, Rape, and Kick questionnaire    Fear of Current or Ex-Partner: No    Emotionally Abused: No    Physically Abused: No    Sexually Abused: No    Outpatient Medications Prior to Visit  Medication Sig Dispense Refill   cetirizine (ZYRTEC) 10 MG tablet Take 10 mg by mouth daily.     cholecalciferol (VITAMIN D3) 25 MCG (1000 UNIT) tablet Take 2,000 Units by mouth daily.     cyclobenzaprine (FLEXERIL) 10 MG tablet Take 10 mg by mouth 3 (three) times daily as needed for muscle spasms.     ferrous sulfate 325 (65 FE) MG tablet Take 1 tablet (325 mg total) by mouth 2 (two) times daily with a meal. (Patient taking differently: Take 325 mg by mouth daily.) 60 tablet 3   fluticasone (FLONASE) 50 MCG/ACT nasal spray Place 1 spray into both nostrils daily.     levonorgestrel (MIRENA) 20 MCG/DAY IUD 1 each by Intrauterine route once. (Inserted 2021)     Multiple Vitamins-Minerals (ONE-A-DAY WOMENS PO) Take 1 tablet by mouth daily.     sertraline (ZOLOFT) 25 MG tablet Take 1 tablet (25 mg total) by mouth daily. 30 tablet 3   vitamin C (ASCORBIC ACID) 500 MG tablet Take 500 mg by mouth daily.     tiZANidine (ZANAFLEX) 4 MG capsule Take 1 capsule (4 mg total) by mouth 3 (three) times daily. (Patient not taking: Reported on 08/02/2022) 30 capsule 1   No facility-administered medications prior to visit.    Allergies  Allergen Reactions   Lisinopril Cough    ROS Review of Systems  Constitutional:  Negative for chills and fever.  Eyes:  Negative for visual  disturbance.  Respiratory:  Negative for chest tightness and shortness of breath.   Musculoskeletal:  Positive for arthralgias.  Neurological:  Negative for dizziness and headaches.      Objective:    Physical Exam HENT:     Head: Normocephalic.     Mouth/Throat:     Mouth: Mucous membranes are moist.  Cardiovascular:     Rate and Rhythm: Normal rate.  Heart sounds: Normal heart sounds.  Pulmonary:     Effort: Pulmonary effort is normal.     Breath sounds: Normal breath sounds.  Musculoskeletal:     Right hip: Tenderness present. No deformity, lacerations or crepitus. Normal strength.     Left hip: No deformity, lacerations, tenderness or bony tenderness. Normal range of motion. Normal strength.     Right foot: Bunion (MTP joint of the great toe) present.     Comments: Tenderness with palpation of right greater trochanter  Neurological:     Mental Status: She is alert.     BP 126/84   Pulse 74   Ht 4\' 11"  (1.499 m)   Wt 147 lb 1.3 oz (66.7 kg)   SpO2 97%   BMI 29.71 kg/m  Wt Readings from Last 3 Encounters:  08/02/22 147 lb 1.3 oz (66.7 kg)  06/14/22 151 lb 2.9 oz (68.6 kg)  05/28/22 154 lb 3.2 oz (69.9 kg)    Lab Results  Component Value Date   TSH 0.659 06/14/2022   Lab Results  Component Value Date   WBC 4.6 06/14/2022   HGB 14.2 06/14/2022   HCT 44.9 06/14/2022   MCV 86 06/14/2022   PLT 227 06/14/2022   Lab Results  Component Value Date   NA 141 06/14/2022   K 4.1 06/14/2022   CO2 23 06/14/2022   GLUCOSE 88 06/14/2022   BUN 10 06/14/2022   CREATININE 0.76 06/14/2022   BILITOT 0.4 06/14/2022   ALKPHOS 53 06/14/2022   AST 21 06/14/2022   ALT 18 06/14/2022   PROT 7.7 06/14/2022   ALBUMIN 4.8 06/14/2022   CALCIUM 9.8 06/14/2022   ANIONGAP 10 10/02/2019   EGFR 98 06/14/2022   Lab Results  Component Value Date   CHOL 168 06/14/2022   Lab Results  Component Value Date   HDL 53 06/14/2022   Lab Results  Component Value Date   LDLCALC  104 (H) 06/14/2022   Lab Results  Component Value Date   TRIG 53 06/14/2022   Lab Results  Component Value Date   CHOLHDL 3.2 06/14/2022   Lab Results  Component Value Date   HGBA1C 5.6 06/14/2022      Assessment & Plan:  Pain of right hip Assessment & Plan: Symptoms likely of gluteal tendinopathy We will treat today with a short course of naproxen 500 mg twice daily for 2 weeks Encourage daily stretching and hip exercises Will consider referral to orthopedics for ongoing worsening symptoms  Orders: -     Naproxen; Take 1 tablet (500 mg total) by mouth 2 (two) times daily with a meal for 14 days.  Dispense: 28 tablet; Refill: 0  Bunion of great toe of right foot Assessment & Plan: Denies pain with rest Reports pain with ambulation Range of motion of the right foot is intact No signs of inflammation noted Will treat conservatively today Conservative management for Bunion I recommend wearing low-heeled shoes with wide toe box, and or Shoe modification: Shoe stretching, vitamin pads, and or Prefabricated or custom orthoses and/or Analgesic as needed for pain    Note: This chart has been completed using Engineer, civil (consulting) software, and while attempts have been made to ensure accuracy, certain words and phrases may not be transcribed as intended.    Follow-up: Return in about 3 months (around 11/01/2022).   Gilmore Laroche, FNP

## 2022-08-02 NOTE — Patient Instructions (Addendum)
I appreciate the opportunity to provide care to you today!    Follow up:  3 months  Labs: next visit   Right hip pain A prescription of naproxen 500 mg to take twice daily as needed for pain is sent to your pharmacy I recommend performing hip exercises daily to stretch the muscles in the right hip   Conservative management for Bunion I recommend wearing low-heeled shoes with wide toe box, and or Shoe modification: Shoe stretching, vitamin pads, and or Prefabricated or custom orthoses and/or Analgesic as needed for pain     Please continue to a heart-healthy diet and increase your physical activities. Try to exercise for at least five days a week.      It was a pleasure to see you and I look forward to continuing to work together on your health and well-being. Please do not hesitate to call the office if you need care or have questions about your care.   Have a wonderful day and week. With Gratitude, Gilmore Laroche MSN, FNP-BC

## 2022-08-03 DIAGNOSIS — M25551 Pain in right hip: Secondary | ICD-10-CM | POA: Insufficient documentation

## 2022-08-03 DIAGNOSIS — M21611 Bunion of right foot: Secondary | ICD-10-CM | POA: Insufficient documentation

## 2022-08-03 NOTE — Assessment & Plan Note (Signed)
Symptoms likely of gluteal tendinopathy We will treat today with a short course of naproxen 500 mg twice daily for 2 weeks Encourage daily stretching and hip exercises Will consider referral to orthopedics for ongoing worsening symptoms

## 2022-08-03 NOTE — Assessment & Plan Note (Signed)
Denies pain with rest Reports pain with ambulation Range of motion of the right foot is intact No signs of inflammation noted Will treat conservatively today Conservative management for Bunion I recommend wearing low-heeled shoes with wide toe box, and or Shoe modification: Shoe stretching, vitamin pads, and or Prefabricated or custom orthoses and/or Analgesic as needed for pain

## 2022-10-02 ENCOUNTER — Ambulatory Visit (HOSPITAL_COMMUNITY)
Admission: RE | Admit: 2022-10-02 | Discharge: 2022-10-02 | Disposition: A | Discharge status: Home / Self Care | Payer: Medicaid Other | Source: Ambulatory Visit | Attending: Family Medicine | Admitting: Family Medicine

## 2022-10-02 ENCOUNTER — Encounter (HOSPITAL_COMMUNITY): Payer: Self-pay

## 2022-10-02 DIAGNOSIS — Z1231 Encounter for screening mammogram for malignant neoplasm of breast: Secondary | ICD-10-CM | POA: Insufficient documentation

## 2022-11-01 ENCOUNTER — Encounter: Payer: Self-pay | Admitting: Family Medicine

## 2022-11-01 ENCOUNTER — Ambulatory Visit: Payer: Medicaid Other | Admitting: Family Medicine

## 2022-11-01 VITALS — BP 134/80 | HR 84 | Ht 59.0 in | Wt 146.1 lb

## 2022-11-01 DIAGNOSIS — M25511 Pain in right shoulder: Secondary | ICD-10-CM | POA: Diagnosis not present

## 2022-11-01 DIAGNOSIS — R7301 Impaired fasting glucose: Secondary | ICD-10-CM

## 2022-11-01 DIAGNOSIS — F339 Major depressive disorder, recurrent, unspecified: Secondary | ICD-10-CM

## 2022-11-01 DIAGNOSIS — E559 Vitamin D deficiency, unspecified: Secondary | ICD-10-CM

## 2022-11-01 DIAGNOSIS — E038 Other specified hypothyroidism: Secondary | ICD-10-CM

## 2022-11-01 DIAGNOSIS — E7849 Other hyperlipidemia: Secondary | ICD-10-CM

## 2022-11-01 MED ORDER — SERTRALINE HCL 25 MG PO TABS: 25.0000 mg | ORAL_TABLET | Freq: Every day | ORAL | 3 refills | Status: DC

## 2022-11-01 MED ORDER — METHYLPREDNISOLONE 4 MG PO TBPK: ORAL_TABLET | ORAL | 0 refills | Status: DC

## 2022-11-01 NOTE — Patient Instructions (Addendum)
I appreciate the opportunity to provide care to you today!    Follow up:  4 months  Labs: please stop by the lab today/during the week to get your blood drawn (CBC, CMP, TSH, Lipid profile, HgA1c, Vit D)  Shoulder pain I've started you on medrol dose pack for your shoulder pain Please follow the instructions on the medication Please do not take ibuprofen while on medrol dose Take tylenol for pain relief and perform regular shoulder exercises    Attached with your AVS, you will find valuable resources for self-education. I highly recommend dedicating some time to thoroughly examine them.   Please continue to a heart-healthy diet and increase your physical activities. Try to exercise for at least five days a week.    It was a pleasure to see you and I look forward to continuing to work together on your health and well-being. Please do not hesitate to call the office if you need care or have questions about your care.  In case of emergency, please visit the Emergency Department for urgent care, or contact our clinic at [phone] to schedule an appointment. We're here to help you!   Have a wonderful day and week. With Gratitude, Gilmore Laroche MSN, FNP-BC

## 2022-11-01 NOTE — Progress Notes (Unsigned)
Established Patient Office Visit  Subjective:  Patient ID: [name], female    DOB: 11/29/1975  Age: 47 y.o. MRN: [id number]  CC:  Chief Complaint  Patient presents with   Chronic Care Management    3 month f/u   Shoulder Pain    Pt reports shoulder pain, states elbow on that side is out of place since a young age.     HPI [name] is a 47 y.o. female with past medical history of depression presents for f/u of  chronic medical conditions.  Right shoulder  Past Medical History:  Diagnosis Date   Anemia 05/31/2014   BV (bacterial vaginosis) 06/16/2015   Depression    Encounter for smoking cessation counseling 06/16/2015   HSV (herpes simplex virus) infection    Hypertension    Smoker 06/16/2015   Umbilical hernia 06/16/2015   Vaginal discharge 05/30/2014   Vitamin D deficiency 06/19/2015   Yeast infection 05/30/2014    Past Surgical History:  Procedure Laterality Date   CESAREAN SECTION     COLONOSCOPY WITH PROPOFOL N/A 07/04/2022   Procedure: COLONOSCOPY WITH PROPOFOL;  Surgeon: Lanelle Bal, DO;  Location: [facility];  Service: Endoscopy;  Laterality: N/A;  11;00 am, asa 2   POLYPECTOMY  07/04/2022   Procedure: POLYPECTOMY;  Surgeon: Lanelle Bal, DO;  Location: [facility];  Service: Endoscopy;;   TUBAL LIGATION      Family History  Problem Relation Age of Onset   Alzheimer's disease Maternal Grandmother    Cancer Maternal Grandfather    Kidney disease Father    Hypertension Mother    Hypertension Brother    Sickle cell anemia Brother    Other Brother        born with fluid on brain   Asthma Son     Social History   Socioeconomic History   Marital status: Legally Separated    Spouse name: Not on file   Number of children: 3   Years of education: Not on file   Highest education level: 10th grade  Occupational History   Not on file  Tobacco Use   Smoking status: Every Day    Current packs/day: 0.50    Average packs/day: 0.5  packs/day for 18.0 years (9.0 ttl pk-yrs)    Types: Cigarettes   Smokeless tobacco: Never   Tobacco comments:    1/2 a day.  Vaping Use   Vaping status: Never Used  Substance and Sexual Activity   Alcohol use: No   Drug use: Not Currently    Types: Marijuana    Comment: none since ine her 59s   Sexual activity: Yes    Partners: Male    Birth control/protection: Surgical, I.U.D.    Comment: tubal  Other Topics Concern   Not on file  Social History Narrative   Works at [employer].    Social Determinants of Health   Financial Resource Strain: High Risk (05/28/2022)   Overall Financial Resource Strain (CARDIA)    Difficulty of Paying Living Expenses: Hard  Food Insecurity: Food Insecurity Present (05/28/2022)   Hunger Vital Sign    Worried About Running Out of Food in the Last Year: Sometimes true    Ran Out of Food in the Last Year: Sometimes true  Transportation Needs: No Transportation Needs (09/28/2021)   PRAPARE - Administrator, Civil Service (Medical): No    Lack of Transportation (Non-Medical): No  Physical Activity: Inactive (05/28/2022)   Exercise  Vital Sign    Days of Exercise per Week: 0 days    Minutes of Exercise per Session: 0 min  Stress: Stress Concern Present (05/28/2022)   [employer] of Occupational Health - Occupational Stress Questionnaire    Feeling of Stress : Rather much  Social Connections: Moderately Isolated (05/28/2022)   Social Connection and Isolation Panel [NHANES]    Frequency of Communication with Friends and Family: More than three times a week    Frequency of Social Gatherings with Friends and Family: Once a week    Attends Religious Services: 1 to 4 times per year    Active Member of [employer] or Organizations: No    Attends Banker Meetings: Never    Marital Status: Separated  Intimate Partner Violence: Not At Risk (05/28/2022)   Humiliation, Afraid, Rape, and Kick questionnaire    Fear of Current or Ex-Partner: No     Emotionally Abused: No    Physically Abused: No    Sexually Abused: No    Outpatient Medications Prior to Visit  Medication Sig Dispense Refill   cetirizine (ZYRTEC) 10 MG tablet Take 10 mg by mouth daily.     cholecalciferol (VITAMIN D3) 25 MCG (1000 UNIT) tablet Take 2,000 Units by mouth daily.     cyclobenzaprine (FLEXERIL) 10 MG tablet Take 10 mg by mouth 3 (three) times daily as needed for muscle spasms.     ferrous sulfate 325 (65 FE) MG tablet Take 1 tablet (325 mg total) by mouth 2 (two) times daily with a meal. (Patient taking differently: Take 325 mg by mouth daily.) 60 tablet 3   fluticasone (FLONASE) 50 MCG/ACT nasal spray Place 1 spray into both nostrils daily.     levonorgestrel (MIRENA) 20 MCG/DAY IUD 1 each by Intrauterine route once. (Inserted 2021)     Multiple Vitamins-Minerals (ONE-A-DAY WOMENS PO) Take 1 tablet by mouth daily.     tiZANidine (ZANAFLEX) 4 MG capsule Take 1 capsule (4 mg total) by mouth 3 (three) times daily. 30 capsule 1   vitamin C (ASCORBIC ACID) 500 MG tablet Take 500 mg by mouth daily.     sertraline (ZOLOFT) 25 MG tablet Take 1 tablet (25 mg total) by mouth daily. 30 tablet 3   No facility-administered medications prior to visit.    Allergies  Allergen Reactions   Lisinopril Cough    ROS Review of Systems  Constitutional:  Negative for chills and fever.  Eyes:  Negative for visual disturbance.  Respiratory:  Negative for chest tightness and shortness of breath.   Musculoskeletal:        Right shoulder pain and stiffness  Neurological:  Negative for dizziness and headaches.      Objective:    Physical Exam HENT:     Head: Normocephalic.     Mouth/Throat:     Mouth: Mucous membranes are moist.  Cardiovascular:     Rate and Rhythm: Normal rate.     Heart sounds: Normal heart sounds.  Pulmonary:     Effort: Pulmonary effort is normal.     Breath sounds: Normal breath sounds.  Musculoskeletal:     Comments: Able to perform active  ROM with stiffness and mild pain reported. No numbness or weakness reported. Sensation is grossly intact  Neurological:     Mental Status: She is alert.     BP 134/80   Pulse 84   Ht 4\' 11"  (1.499 m)   Wt 146 lb 1.3 oz (66.3 kg)   SpO2  96%   BMI 29.50 kg/m  Wt Readings from Last 3 Encounters:  11/01/22 146 lb 1.3 oz (66.3 kg)  08/02/22 147 lb 1.3 oz (66.7 kg)  06/14/22 151 lb 2.9 oz (68.6 kg)    Lab Results  Component Value Date   TSH 0.659 06/14/2022   Lab Results  Component Value Date   WBC 4.6 06/14/2022   HGB 14.2 06/14/2022   HCT 44.9 06/14/2022   MCV 86 06/14/2022   PLT 227 06/14/2022   Lab Results  Component Value Date   NA 141 06/14/2022   K 4.1 06/14/2022   CO2 23 06/14/2022   GLUCOSE 88 06/14/2022   BUN 10 06/14/2022   CREATININE 0.76 06/14/2022   BILITOT 0.4 06/14/2022   ALKPHOS 53 06/14/2022   AST 21 06/14/2022   ALT 18 06/14/2022   PROT 7.7 06/14/2022   ALBUMIN 4.8 06/14/2022   CALCIUM 9.8 06/14/2022   ANIONGAP 10 10/02/2019   EGFR 98 06/14/2022   Lab Results  Component Value Date   CHOL 168 06/14/2022   Lab Results  Component Value Date   HDL 53 06/14/2022   Lab Results  Component Value Date   LDLCALC 104 (H) 06/14/2022   Lab Results  Component Value Date   TRIG 53 06/14/2022   Lab Results  Component Value Date   CHOLHDL 3.2 06/14/2022   Lab Results  Component Value Date   HGBA1C 5.6 06/14/2022      Assessment & Plan:  Depression, recurrent (HCC) Assessment & Plan: Denies suicidal thoughts and ideation Refill sent to the pharmacy Education as follow: Nonpharmacologic management of anxiety and depression  Mindfulness and Meditation Practices like mindfulness meditation can help reduce symptoms by promoting relaxation and present-moment awareness.  Exercise  Regular physical activity has been shown to improve mood and reduce anxiety through the release of endorphins and other neurochemicals.  Healthy Diet Eating a  balanced diet rich in fruits, vegetables, whole grains, and lean proteins can support overall mental health.  Sleep Hygiene  Establishing a regular sleep routine and ensuring good sleep quality can significantly impact mood and anxiety levels.  Stress Management Techniques Activities such as yoga, tai chi, and deep breathing exercises can help manage stress.  Social Support Maintaining strong relationships and seeking support from friends, family, or support groups can provide emotional comfort and reduce feelings of isolation.  Lifestyle Modifications Reducing alcohol and caffeine intake, quitting smoking, and avoiding recreational drugs can improve symptoms.  Art and Music Therapy Engaging in creative activities like painting, drawing, or playing music can be therapeutic and help express emotions.  Light Therapy Particularly useful for seasonal affective disorder (SAD), exposure to bright light can help regulate mood. .    Orders: -     Sertraline HCl; Take 1 tablet (25 mg total) by mouth daily.  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 Will treat shoulder aches and stiffness with medrol dose pack Advised not to take nsaids products while taking medication Encouraged use of Tylenol for pain relief Encouraged at home shoulder exercises Recommended rest and avoidance of aggravating factors  Orders: -     methylPREDNISolone; Take as the package instructed  Dispense: 1 each; Refill: 0  IFG (impaired fasting glucose) -     Hemoglobin A1c  Vitamin D deficiency -     VITAMIN D 25 Hydroxy (Vit-D Deficiency, Fractures)  Other specified hypothyroidism -  TSH + free T4  Other hyperlipidemia -     Lipid panel -     CMP14+EGFR -     CBC with Differential/Platelet  Note: This chart has been completed using Engineer, civil (consulting) software, and while attempts have been made  to ensure accuracy, certain words and phrases may not be transcribed as intended.    Follow-up: Return in about 4 months (around 03/04/2023).   Gilmore Laroche, FNP

## 2022-11-02 DIAGNOSIS — G8929 Other chronic pain: Secondary | ICD-10-CM | POA: Insufficient documentation

## 2022-11-02 DIAGNOSIS — M25511 Pain in right shoulder: Secondary | ICD-10-CM | POA: Insufficient documentation

## 2022-11-02 NOTE — Assessment & Plan Note (Signed)
Denies suicidal thoughts and ideation Refill sent to the pharmacy Education as follow: Nonpharmacologic management of anxiety and depression  Mindfulness and Meditation Practices like mindfulness meditation can help reduce symptoms by promoting relaxation and present-moment awareness.  Exercise  Regular physical activity has been shown to improve mood and reduce anxiety through the release of endorphins and other neurochemicals.  Healthy Diet Eating a balanced diet rich in fruits, vegetables, whole grains, and lean proteins can support overall mental health.  Sleep Hygiene  Establishing a regular sleep routine and ensuring good sleep quality can significantly impact mood and anxiety levels.  Stress Management Techniques Activities such as yoga, tai chi, and deep breathing exercises can help manage stress.  Social Support Maintaining strong relationships and seeking support from friends, family, or support groups can provide emotional comfort and reduce feelings of isolation.  Lifestyle Modifications Reducing alcohol and caffeine intake, quitting smoking, and avoiding recreational drugs can improve symptoms.  Art and Music Therapy Engaging in creative activities like painting, drawing, or playing music can be therapeutic and help express emotions.  Light Therapy Particularly useful for seasonal affective disorder (SAD), exposure to bright light can help regulate mood. Marland Kitchen

## 2022-11-02 NOTE — Assessment & Plan Note (Signed)
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

## 2022-11-05 LAB — CMP14+EGFR
ALT: 13 IU/L (ref 0–32)
AST: 18 IU/L (ref 0–40)
Albumin: 4.1 g/dL (ref 3.9–4.9)
Alkaline Phosphatase: 55 IU/L (ref 44–121)
BUN/Creatinine Ratio: 19 (ref 9–23)
BUN: 14 mg/dL (ref 6–24)
Bilirubin Total: <0.2 mg/dL (ref 0.0–1.2)
CO2: 21 mmol/L (ref 20–29)
Calcium: 8.9 mg/dL (ref 8.7–10.2)
Chloride: 103 mmol/L (ref 96–106)
Creatinine, Ser: 0.74 mg/dL (ref 0.57–1.00)
Globulin, Total: 2.5 g/dL (ref 1.5–4.5)
Glucose: 88 mg/dL (ref 70–99)
Potassium: 3.9 mmol/L (ref 3.5–5.2)
Sodium: 138 mmol/L (ref 134–144)
Total Protein: 6.6 g/dL (ref 6.0–8.5)
eGFR: 102 mL/min/{1.73_m2} (ref >59)

## 2022-11-05 LAB — LIPID PANEL
Chol/HDL Ratio: 3.4 ratio (ref 0.0–4.4)
Cholesterol, Total: 153 mg/dL (ref 100–199)
HDL: 45 mg/dL (ref >39)
LDL Chol Calc (NIH): 91 mg/dL (ref 0–99)
Triglycerides: 88 mg/dL (ref 0–149)
VLDL Cholesterol Cal: 17 mg/dL (ref 5–40)

## 2022-11-05 LAB — HEMOGLOBIN A1C
Est. average glucose Bld gHb Est-mCnc: 117 mg/dL
Hgb A1c MFr Bld: 5.7 % — ABNORMAL HIGH (ref 4.8–5.6)

## 2022-11-05 LAB — CBC WITH DIFFERENTIAL/PLATELET
Basophils Absolute: 0 10*3/uL (ref 0.0–0.2)
Basos: 0 %
EOS (ABSOLUTE): 0 10*3/uL (ref 0.0–0.4)
Eos: 1 %
Hematocrit: 38.2 % (ref 34.0–46.6)
Hemoglobin: 12.3 g/dL (ref 11.1–15.9)
Immature Grans (Abs): 0 10*3/uL (ref 0.0–0.1)
Immature Granulocytes: 0 %
Lymphocytes Absolute: 2 10*3/uL (ref 0.7–3.1)
Lymphs: 39 %
MCH: 27 pg (ref 26.6–33.0)
MCHC: 32.2 g/dL (ref 31.5–35.7)
MCV: 84 fL (ref 79–97)
Monocytes Absolute: 0.4 10*3/uL (ref 0.1–0.9)
Monocytes: 8 %
Neutrophils Absolute: 2.7 10*3/uL (ref 1.4–7.0)
Neutrophils: 52 %
Platelets: 183 10*3/uL (ref 150–450)
RBC: 4.55 x10E6/uL (ref 3.77–5.28)
RDW: 13.2 % (ref 11.7–15.4)
WBC: 5.2 10*3/uL (ref 3.4–10.8)

## 2022-11-05 LAB — TSH+FREE T4
Free T4: 0.97 ng/dL (ref 0.82–1.77)
TSH: 1.18 u[IU]/mL (ref 0.450–4.500)

## 2022-11-05 LAB — VITAMIN D 25 HYDROXY (VIT D DEFICIENCY, FRACTURES): Vit D, 25-Hydroxy: 58.4 ng/mL (ref 30.0–100.0)

## 2022-11-18 ENCOUNTER — Encounter: Payer: Self-pay | Admitting: Family Medicine

## 2022-11-18 ENCOUNTER — Ambulatory Visit: Payer: Medicaid Other | Admitting: Family Medicine

## 2022-11-18 VITALS — BP 132/84 | HR 81 | Ht 59.0 in | Wt 145.0 lb

## 2022-11-18 DIAGNOSIS — Z975 Presence of (intrauterine) contraceptive device: Secondary | ICD-10-CM | POA: Diagnosis not present

## 2022-11-18 DIAGNOSIS — Z716 Tobacco abuse counseling: Secondary | ICD-10-CM | POA: Diagnosis not present

## 2022-11-18 DIAGNOSIS — R300 Dysuria: Secondary | ICD-10-CM | POA: Diagnosis not present

## 2022-11-18 LAB — POCT URINALYSIS DIPSTICK
Blood, UA: NEGATIVE
Glucose, UA: NEGATIVE
Ketones, UA: NEGATIVE
Leukocytes, UA: NEGATIVE
Nitrite, UA: NEGATIVE
Protein, UA: NEGATIVE

## 2022-11-18 MED ORDER — NICOTINE 14 MG/24HR TD PT24: 14.0000 mg | MEDICATED_PATCH | Freq: Every day | TRANSDERMAL | 0 refills | Status: DC

## 2022-11-18 NOTE — Progress Notes (Unsigned)
GYNECOLOGY OFFICE VISIT NOTE  History:   [name] is a 47 y.o. [phone] here today for IUD string check. She was seen by her PCP for chronic right hip pain and wanted to make sure her IUD was still in place because she is concerned about this being the cause of her right groin pain. She is experiencing dysuria. She denies any abnormal vaginal discharge, bleeding, pelvic pain.    Past Medical History:  Diagnosis Date   Anemia 05/31/2014   BV (bacterial vaginosis) 06/16/2015   Depression    Encounter for smoking cessation counseling 06/16/2015   HSV (herpes simplex virus) infection    Hypertension    Smoker 06/16/2015   Umbilical hernia 06/16/2015   Vaginal discharge 05/30/2014   Vitamin D deficiency 06/19/2015   Yeast infection 05/30/2014    Past Surgical History:  Procedure Laterality Date   CESAREAN SECTION     COLONOSCOPY WITH PROPOFOL N/A 07/04/2022   Procedure: COLONOSCOPY WITH PROPOFOL;  Surgeon: Lanelle Bal, DO;  [facility];  Service: Endoscopy;  Laterality: N/A;  11;00 am, asa 2   POLYPECTOMY  07/04/2022   Procedure: POLYPECTOMY;  Surgeon: Lanelle Bal, DO;  Location: [facility];  Service: Endoscopy;;   TUBAL LIGATION      The following portions of the patient's history were reviewed and updated as appropriate: allergies, current medications, past family history, past medical history, past social history, past surgical history and problem list.   Health Maintenance:  Normal pap and negative HRHPV on 06/17/2022.  Normal mammogram on 10/02/2022.   Review of Systems:  Pertinent items noted in HPI and remainder of comprehensive ROS otherwise negative.  Physical Exam:  BP 132/84 (BP Location: Left Arm, Patient Position: Sitting, Cuff Size: Normal)   Pulse 81   Ht 4\' 11"  (1.499 m)   Wt 145 lb (65.8 kg)   LMP 11/04/2022 (Approximate)   BMI 29.29 kg/m  General: Appears well, no acute distress. Age appropriate. Cardiac: RRR, normal heart  sounds, no murmurs Respiratory: CTAB, normal effort Abdomen: soft, nontender, nondistended Extremities: No edema or cyanosis. Skin: Warm and dry, no rashes noted Neuro: alert and oriented, no focal deficits Psych: normal affect Pelvic: Normal appearing external genitalia; normal urethral meatus; normal appearing vaginal mucosa and cervix.  No abnormal discharge noted. Both IUD strings visualize at lease 2 cm out of the external os. Performed in the presence of a chaperone  Labs and Imaging Results for orders placed or performed in visit on 11/18/22 (from the past 168 hour(s))  POCT Urinalysis Dipstick   Collection Time: 11/18/22  3:10 PM  Result Value Ref Range   Color, UA     Clarity, UA     Glucose, UA Negative Negative   Bilirubin, UA     Ketones, UA neg    Spec Grav, UA     Blood, UA neg    pH, UA     Protein, UA Negative Negative   Urobilinogen, UA     Nitrite, UA neg    Leukocytes, UA Negative Negative   Appearance     Odor     No results found.    Assessment and Plan:    1. IUD (intrauterine device) in place Both IUD strings visualized.  2. Dysuria UA unremarkable. No other symptoms will wait for culture.  - Urine Culture pending  3. Encounter for smoking cessation counseling Smokes 8 cigarettes daily. Desires to quit. Discussed several methods including wellbutrin. She would  like to try patches which she has tried in the past. Congratulated her on this step.  - nicotine (NICODERM CQ - DOSED IN MG/24 HOURS) 14 mg/24hr patch; Place 1 patch (14 mg total) onto the skin daily.  Dispense: 48 patch; Refill: 0   Routine preventative health maintenance measures emphasized. Please refer to After Visit Summary for other counseling recommendations.   No follow-ups on file.    Lavonda Jumbo, DO OB Fellow, [facility], Center for [facility] 11/18/2022, 3:12 PM

## 2023-01-23 ENCOUNTER — Ambulatory Visit: Payer: Medicaid Other | Admitting: Family Medicine

## 2023-01-23 ENCOUNTER — Encounter: Payer: Self-pay | Admitting: Family Medicine

## 2023-01-23 VITALS — BP 128/76 | HR 79 | Ht 59.0 in | Wt 145.0 lb

## 2023-01-23 DIAGNOSIS — B351 Tinea unguium: Secondary | ICD-10-CM

## 2023-01-23 DIAGNOSIS — G8929 Other chronic pain: Secondary | ICD-10-CM | POA: Diagnosis not present

## 2023-01-23 DIAGNOSIS — Z23 Encounter for immunization: Secondary | ICD-10-CM | POA: Diagnosis not present

## 2023-01-23 DIAGNOSIS — M25511 Pain in right shoulder: Secondary | ICD-10-CM | POA: Diagnosis not present

## 2023-01-23 MED ORDER — TERBINAFINE HCL 1 % EX CREA: TOPICAL_CREAM | CUTANEOUS | 0 refills | Status: DC

## 2023-01-23 MED ORDER — NAPROXEN 500 MG PO TABS: 500.0000 mg | ORAL_TABLET | Freq: Two times a day (BID) | ORAL | 0 refills | Status: DC | PRN

## 2023-01-23 NOTE — Progress Notes (Signed)
Established Patient Office Visit  Subjective:  Patient ID: [name], female    DOB: [birth year]  Age: 47 y.o. MRN: [id number]  CC:  Chief Complaint  Patient presents with   feet problem    Pt reports problem in feet with fungus. Its on both feet, has itching, ongoing for 2 months.    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 Would like ibuprofen or muscle relaxer.     HPI [name] is a 47 y.o. female with past medical history of  depression, right shoulder pain presents for f/u of  chronic medical conditions.  Onychomycosis: The patient complains of itching on the soles of the feet for the past 2 months. There is no associated redness, swelling, or recent trauma.  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 She denies any recent trauma, numbness, or tingling.    Past Medical History:  Diagnosis Date   Anemia 05/31/2014   BV (bacterial vaginosis) 06/16/2015   Depression    Encounter for smoking cessation counseling 06/16/2015   HSV (herpes simplex virus) infection    Hypertension    Smoker 06/16/2015   Umbilical hernia 06/16/2015   Vaginal discharge 05/30/2014   Vitamin D deficiency 06/19/2015   Yeast infection 05/30/2014    Past Surgical History:  Procedure Laterality Date   CESAREAN SECTION     COLONOSCOPY WITH PROPOFOL N/A 07/04/2022   Procedure: COLONOSCOPY WITH PROPOFOL;  Surgeon: Lanelle Bal, DO;  [facility];  Service: Endoscopy;  Laterality: N/A;  11;00 am, asa 2   POLYPECTOMY  07/04/2022   Procedure: POLYPECTOMY;  Surgeon: Lanelle Bal, DO;  [facility];  Service: Endoscopy;;   TUBAL LIGATION      Family History  Problem Relation Age of Onset   Alzheimer's disease Maternal Grandmother    Cancer Maternal Grandfather    Kidney disease Father    Hypertension Mother    Hypertension Brother    Sickle cell  anemia Brother    Other Brother        born with fluid on brain   Asthma Son     Social History   Socioeconomic History   Marital status: Legally Separated    Spouse name: Not on file   Number of children: 3   Years of education: Not on file   Highest education level: 10th grade  Occupational History   Not on file  Tobacco Use   Smoking status: Every Day    Current packs/day: 0.50    Average packs/day: 0.5 packs/day for 18.0 years (9.0 ttl pk-yrs)    Types: Cigarettes   Smokeless tobacco: Never   Tobacco comments:    1/2 a day.  Vaping Use   Vaping status: Never Used  Substance and Sexual Activity   Alcohol use: No   Drug use: Not Currently    Types: Marijuana    Comment: none since ine her 18s   Sexual activity: Yes    Partners: Male    Birth control/protection: Surgical, I.U.D.    Comment: tubal  Other Topics Concern   Not on file  Social History Narrative   Works at [employer].    Social Determinants of Health   Financial Resource Strain: High Risk (05/28/2022)   Overall Financial Resource Strain (CARDIA)    Difficulty of Paying Living Expenses: Hard  Food  Insecurity: Food Insecurity Present (05/28/2022)   Hunger Vital Sign    Worried About Running Out of Food in the Last Year: Sometimes true    Ran Out of Food in the Last Year: Sometimes true  Transportation Needs: No Transportation Needs (09/28/2021)   PRAPARE - Administrator, Civil Service (Medical): No    Lack of Transportation (Non-Medical): No  Physical Activity: Inactive (05/28/2022)   Exercise Vital Sign    Days of Exercise per Week: 0 days    Minutes of Exercise per Session: 0 min  Stress: Stress Concern Present (05/28/2022)   [employer] of Occupational Health - Occupational Stress Questionnaire    Feeling of Stress : Rather much  Social Connections: Moderately Isolated (05/28/2022)   Social Connection and Isolation Panel [NHANES]    Frequency of Communication with Friends and Family: More  than three times a week    Frequency of Social Gatherings with Friends and Family: Once a week    Attends Religious Services: 1 to 4 times per year    Active Member of [employer] or Organizations: No    Attends Banker Meetings: Never    Marital Status: Separated  Intimate Partner Violence: Not At Risk (05/28/2022)   Humiliation, Afraid, Rape, and Kick questionnaire    Fear of Current or Ex-Partner: No    Emotionally Abused: No    Physically Abused: No    Sexually Abused: No    Outpatient Medications Prior to Visit  Medication Sig Dispense Refill   cetirizine (ZYRTEC) 10 MG tablet Take 10 mg by mouth daily.     cholecalciferol (VITAMIN D3) 25 MCG (1000 UNIT) tablet Take 2,000 Units by mouth daily.     cyclobenzaprine (FLEXERIL) 10 MG tablet Take 10 mg by mouth 3 (three) times daily as needed for muscle spasms.     ferrous sulfate 325 (65 FE) MG tablet Take 1 tablet (325 mg total) by mouth 2 (two) times daily with a meal. (Patient taking differently: Take 325 mg by mouth daily.) 60 tablet 3   fluticasone (FLONASE) 50 MCG/ACT nasal spray Place 1 spray into both nostrils daily.     levonorgestrel (MIRENA) 20 MCG/DAY IUD 1 each by Intrauterine route once. (Inserted 2021)     methylPREDNISolone (MEDROL DOSEPAK) 4 MG TBPK tablet Take as the package instructed 1 each 0   Multiple Vitamins-Minerals (ONE-A-DAY WOMENS PO) Take 1 tablet by mouth daily.     nicotine (NICODERM CQ - DOSED IN MG/24 HOURS) 14 mg/24hr patch Place 1 patch (14 mg total) onto the skin daily. 48 patch 0   sertraline (ZOLOFT) 25 MG tablet Take 1 tablet (25 mg total) by mouth daily. 30 tablet 3   tiZANidine (ZANAFLEX) 4 MG capsule Take 1 capsule (4 mg total) by mouth 3 (three) times daily. 30 capsule 1   vitamin C (ASCORBIC ACID) 500 MG tablet Take 500 mg by mouth daily.     No facility-administered medications prior to visit.    Allergies  Allergen Reactions   Lisinopril Cough    ROS Review of Systems   Constitutional:  Negative for chills and fever.  Eyes:  Negative for visual disturbance.  Respiratory:  Negative for chest tightness and shortness of breath.   Skin:  Positive for color change.  Neurological:  Negative for dizziness and headaches.      Objective:    Physical Exam HENT:     Head: Normocephalic.     Mouth/Throat:     Mouth: Mucous  membranes are moist.  Cardiovascular:     Rate and Rhythm: Normal rate.     Heart sounds: Normal heart sounds.  Pulmonary:     Effort: Pulmonary effort is normal.     Breath sounds: Normal breath sounds.  Feet:     Right foot:     Toenail Condition: Right toenails are abnormally thick. Fungal disease present.    Left foot:     Toenail Condition: Left toenails are abnormally thick. Fungal disease present. Neurological:     Mental Status: She is alert.     BP 128/76   Pulse 79   Ht 4\' 11"  (1.499 m)   Wt 145 lb 0.6 oz (65.8 kg)   SpO2 97%   BMI 29.29 kg/m  Wt Readings from Last 3 Encounters:  01/23/23 145 lb 0.6 oz (65.8 kg)  11/18/22 145 lb (65.8 kg)  11/01/22 146 lb 1.3 oz (66.3 kg)    Lab Results  Component Value Date   TSH 1.180 11/04/2022   Lab Results  Component Value Date   WBC 5.2 11/04/2022   HGB 12.3 11/04/2022   HCT 38.2 11/04/2022   MCV 84 11/04/2022   PLT 183 11/04/2022   Lab Results  Component Value Date   NA 138 11/04/2022   K 3.9 11/04/2022   CO2 21 11/04/2022   GLUCOSE 88 11/04/2022   BUN 14 11/04/2022   CREATININE 0.74 11/04/2022   BILITOT <0.2 11/04/2022   ALKPHOS 55 11/04/2022   AST 18 11/04/2022   ALT 13 11/04/2022   PROT 6.6 11/04/2022   ALBUMIN 4.1 11/04/2022   CALCIUM 8.9 11/04/2022   ANIONGAP 10 10/02/2019   EGFR 102 11/04/2022   Lab Results  Component Value Date   CHOL 153 11/04/2022   Lab Results  Component Value Date   HDL 45 11/04/2022   Lab Results  Component Value Date   LDLCALC 91 11/04/2022   Lab Results  Component Value Date   TRIG 88 11/04/2022   Lab  Results  Component Value Date   CHOLHDL 3.4 11/04/2022   Lab Results  Component Value Date   HGBA1C 5.7 (H) 11/04/2022      Assessment & Plan:  Onychomycosis of toenail Assessment & Plan: -Apply terbinafine 1% cram twice daily to the affected site Here are some nonpharmacological interventions for managing onychomycosis (fungal nail infection): -Keep feet clean and dry, especially between the toes. -Wash your feet regularly and dry them thoroughly, particularly after swimming or bathing. -Trim and file the affected nails regularly to reduce thickness and promote airflow. -Avoid damaging the surrounding skin to prevent further infection. -Wear moisture-wicking socks to keep feet dry. -Change socks daily or more frequently if they become damp. -Choose well-ventilated shoes made of breathable materials to reduce moisture accumulation. -Avoid wearing tight-fitting shoes or synthetic materials that trap moisture. -Disinfect shoes regularly with antifungal sprays or powders to eliminate fungal spores. Consider using foot powder to absorb moisture inside shoes. -Avoid walking barefoot in communal areas such as pools, gyms, and locker rooms to reduce the risk of spreading the infection or acquiring it from others. -Maintain a balanced diet to support overall health, which can aid in recovery. -Consider reducing sugar intake, as fungi thrive on sugar. -Some people find topical application of essential oils like tea tree oil or oregano oil helpful, as they have antifungal properties. -Dilute with a carrier oil and apply to the affected nails. Natural Remedies:Vinegar soaks (mixing equal parts vinegar and water) may help reduce fungal  growth when applied regularly, though evidence varies.  Orders: -     Terbinafine HCl; Apply to the bottom and side of foot, the toe nails twice daily for 2 weeks  Dispense: 42 g; Refill: 0  Chronic right shoulder pain Assessment & Plan: Take naproxen 500 mg as  needed for shoulder pain and aches. Apply heat to the affected area for 10 to 15 minutes, 3-4 times daily, to help relieve pain and stiffness. Perform stretching and strengthening exercises for the affected shoulder to improve mobility and reduce discomfort. Avoid activities that exacerbate your shoulder pain to prevent further irritation or injury.  Orders: -     Naproxen; Take 1 tablet (500 mg total) by mouth 2 (two) times daily as needed.  Dispense: 60 tablet; Refill: 0  Encounter for immunization -     Flu vaccine trivalent PF, 6mos and older(Flulaval,Afluria,Fluarix,Fluzone)   Note: This chart has been completed using Engineer, civil (consulting) software, and while attempts have been made to ensure accuracy, certain words and phrases may not be transcribed as intended.   Follow-up: Return in about 3 months (around 04/25/2023).   Gilmore Laroche, FNP

## 2023-01-23 NOTE — Patient Instructions (Addendum)
I appreciate the opportunity to provide care to you today!    Follow up:  3 months  Onychomycosis -Apply terbinafine 1% cram twice daily to the affected site  Here are some nonpharmacological interventions for managing onychomycosis (fungal nail infection): -Keep feet clean and dry, especially between the toes. -Wash your feet regularly and dry them thoroughly, particularly after swimming or bathing. -Trim and file the affected nails regularly to reduce thickness and promote airflow. -Avoid damaging the surrounding skin to prevent further infection. -Wear moisture-wicking socks to keep feet dry. -Change socks daily or more frequently if they become damp. -Choose well-ventilated shoes made of breathable materials to reduce moisture accumulation. -Avoid wearing tight-fitting shoes or synthetic materials that trap moisture. -Disinfect shoes regularly with antifungal sprays or powders to eliminate fungal spores. Consider using foot powder to absorb moisture inside shoes. -Avoid walking barefoot in communal areas such as pools, gyms, and locker rooms to reduce the risk of spreading the infection or acquiring it from others. -Maintain a balanced diet to support overall health, which can aid in recovery. -Consider reducing sugar intake, as fungi thrive on sugar. -Some people find topical application of essential oils like tea tree oil or oregano oil helpful, as they have antifungal properties. -Dilute with a carrier oil and apply to the affected nails. Natural Remedies:Vinegar soaks (mixing equal parts vinegar and water) may help reduce fungal growth when applied regularly, though evidence varies.  Shoulder Pain Management:  Take naproxen 500 mg as needed for shoulder pain and aches. Apply heat to the affected area for 10 to 15 minutes, 3-4 times daily, to help relieve pain and stiffness. Perform stretching and strengthening exercises for the affected shoulder to improve mobility and reduce  discomfort. Avoid activities that exacerbate your shoulder pain to prevent further irritation or injury.   Attached with your AVS, you will find valuable resources for self-education. I highly recommend dedicating some time to thoroughly examine them.   Please continue to a heart-healthy diet and increase your physical activities. Try to exercise for at least five days a week.    It was a pleasure to see you and I look forward to continuing to work together on your health and well-being. Please do not hesitate to call the office if you need care or have questions about your care.  In case of emergency, please visit the Emergency Department for urgent care, or contact our clinic at [phone] to schedule an appointment. We're here to help you!   Have a wonderful day and week. With Gratitude, Gilmore Laroche MSN, FNP-BC

## 2023-01-25 DIAGNOSIS — B351 Tinea unguium: Secondary | ICD-10-CM | POA: Insufficient documentation

## 2023-01-25 NOTE — Assessment & Plan Note (Signed)
Take naproxen 500 mg as needed for shoulder pain and aches. Apply heat to the affected area for 10 to 15 minutes, 3-4 times daily, to help relieve pain and stiffness. Perform stretching and strengthening exercises for the affected shoulder to improve mobility and reduce discomfort. Avoid activities that exacerbate your shoulder pain to prevent further irritation or injury.

## 2023-01-25 NOTE — Assessment & Plan Note (Addendum)
-  Apply terbinafine 1% cram twice daily to the affected site Here are some nonpharmacological interventions for managing onychomycosis (fungal nail infection): -Keep feet clean and dry, especially between the toes. -Wash your feet regularly and dry them thoroughly, particularly after swimming or bathing. -Trim and file the affected nails regularly to reduce thickness and promote airflow. -Avoid damaging the surrounding skin to prevent further infection. -Wear moisture-wicking socks to keep feet dry. -Change socks daily or more frequently if they become damp. -Choose well-ventilated shoes made of breathable materials to reduce moisture accumulation. -Avoid wearing tight-fitting shoes or synthetic materials that trap moisture. -Disinfect shoes regularly with antifungal sprays or powders to eliminate fungal spores. Consider using foot powder to absorb moisture inside shoes. -Avoid walking barefoot in communal areas such as pools, gyms, and locker rooms to reduce the risk of spreading the infection or acquiring it from others. -Maintain a balanced diet to support overall health, which can aid in recovery. -Consider reducing sugar intake, as fungi thrive on sugar. -Some people find topical application of essential oils like tea tree oil or oregano oil helpful, as they have antifungal properties. -Dilute with a carrier oil and apply to the affected nails. Natural Remedies:Vinegar soaks (mixing equal parts vinegar and water) may help reduce fungal growth when applied regularly, though evidence varies.

## 2023-02-07 ENCOUNTER — Other Ambulatory Visit (INDEPENDENT_AMBULATORY_CARE_PROVIDER_SITE_OTHER): Payer: Medicaid Other

## 2023-02-07 ENCOUNTER — Other Ambulatory Visit (HOSPITAL_COMMUNITY)
Admission: RE | Admit: 2023-02-07 | Discharge: 2023-02-07 | Disposition: A | Discharge status: Home / Self Care | Payer: Medicaid Other | Source: Ambulatory Visit | Attending: Obstetrics & Gynecology | Admitting: Obstetrics & Gynecology

## 2023-02-07 DIAGNOSIS — N898 Other specified noninflammatory disorders of vagina: Secondary | ICD-10-CM

## 2023-02-07 NOTE — Progress Notes (Signed)
   NURSE VISIT- VAGINITIS/STD/POC  SUBJECTIVE:  [name] is a 47 y.o. [id number] GYN patientfemale here for a vaginal swab for vaginitis screening.  She reports the following symptoms: discharge described as white, local irritation, odor, and vulvar itching for 5 days. Denies abnormal vaginal bleeding, significant pelvic pain, fever, or UTI symptoms.  OBJECTIVE:  There were no vitals taken for this visit.  Appears well, in no apparent distress  ASSESSMENT: Vaginal swab for vaginitis screening  PLAN: Self-collected vaginal probe for Gonorrhea, Chlamydia, Trichomonas, Bacterial Vaginosis, Yeast sent to lab Treatment: to be determined once results are received Follow-up as needed if symptoms persist/worsen, or new symptoms develop  Caralyn Guile  02/07/2023 10:49 AM

## 2023-02-10 LAB — CERVICOVAGINAL ANCILLARY ONLY
Bacterial Vaginitis (gardnerella): POSITIVE — AB
Candida Glabrata: NEGATIVE
Candida Vaginitis: POSITIVE — AB
Chlamydia: NEGATIVE
Comment: NEGATIVE
Comment: NEGATIVE
Comment: NEGATIVE
Comment: NEGATIVE
Comment: NEGATIVE
Comment: NORMAL
Neisseria Gonorrhea: NEGATIVE
Trichomonas: NEGATIVE

## 2023-02-12 ENCOUNTER — Encounter: Payer: Self-pay | Admitting: Adult Health

## 2023-02-12 ENCOUNTER — Ambulatory Visit: Payer: Medicaid Other | Admitting: Adult Health

## 2023-02-12 VITALS — BP 141/89 | HR 78 | Ht 60.0 in | Wt 141.0 lb

## 2023-02-12 DIAGNOSIS — R1031 Right lower quadrant pain: Secondary | ICD-10-CM | POA: Insufficient documentation

## 2023-02-12 DIAGNOSIS — R030 Elevated blood-pressure reading, without diagnosis of hypertension: Secondary | ICD-10-CM

## 2023-02-12 DIAGNOSIS — Z975 Presence of (intrauterine) contraceptive device: Secondary | ICD-10-CM

## 2023-02-12 DIAGNOSIS — B9689 Other specified bacterial agents as the cause of diseases classified elsewhere: Secondary | ICD-10-CM | POA: Insufficient documentation

## 2023-02-12 DIAGNOSIS — B379 Candidiasis, unspecified: Secondary | ICD-10-CM | POA: Diagnosis not present

## 2023-02-12 DIAGNOSIS — N760 Acute vaginitis: Secondary | ICD-10-CM | POA: Insufficient documentation

## 2023-02-12 DIAGNOSIS — N921 Excessive and frequent menstruation with irregular cycle: Secondary | ICD-10-CM

## 2023-02-12 MED ORDER — FLUCONAZOLE 150 MG PO TABS: ORAL_TABLET | ORAL | 1 refills | Status: DC

## 2023-02-12 MED ORDER — METRONIDAZOLE 500 MG PO TABS: 500.0000 mg | ORAL_TABLET | Freq: Two times a day (BID) | ORAL | 0 refills | Status: DC

## 2023-02-12 NOTE — Progress Notes (Signed)
  Subjective:     Patient ID: [name], female   DOB: Feb 28, 1976, 47 y.o.   MRN: [id number]  HPI [name] is a 47 year old black female,separated, [id number] in complaining of right side discomfort for several months. She had BV and yeast on vaginal swab done 02/07/23 and needs treatment.     Component Value Date/Time   DIAGPAP  05/28/2022 1137    - Negative for intraepithelial lesion or malignancy (NILM)   HPVHIGH Negative 05/28/2022 1137   ADEQPAP  05/28/2022 1137    Satisfactory for evaluation; transformation zone component PRESENT.   PCP is Gilmore Laroche NP  Review of Systems +right side discomfort for several months Has BTB with IUD Denies any problems with urination, bowel movements or sex Reviewed past medical,surgical, social and family history. Reviewed medications and allergies.     Objective:   Physical Exam BP (!) 141/89 (BP Location: Right Arm, Patient Position: Sitting, Cuff Size: Normal)   Pulse 78   Ht 5' (1.524 m)   Wt 141 lb (64 kg)   LMP 02/05/2023 (Approximate)   BMI 27.54 kg/m     Skin warm and dry.Pelvic: external genitalia is normal in appearance no lesions, vagina: +blood with odor,urethra has no lesions or masses noted, cervix:smooth and bulbous,+IUD strings at os, uterus: normal size, shape and contour, non tender, no masses felt, adnexa: no masses or tenderness noted. Bladder is non tender and no masses felt.   Upstream - 02/12/23 0957       Pregnancy Intention Screening   Does the patient want to become pregnant in the next year? No    Does the patient's partner want to become pregnant in the next year? No    Would the patient like to discuss contraceptive options today? No      Contraception Wrap Up   Current Method IUD or IUS;Female Sterilization    End Method IUD or IUS;Female Sterilization    Contraception Counseling Provided Yes            Examination chaperoned by [place] [employer] NP student  Assessment:     1. RLQ  discomfort +discomfort right side for several months Return in about 6 days for pelvic US to assess uterus and ovaries  - US PELVIC COMPLETE WITH TRANSVAGINAL; Future  2. BV (bacterial vaginosis) +BV on CV swab, will rx flagyl 500 mg 1 bid x 7 days, no sex or alcohol while taking  3. IUD (intrauterine device) in place +strings at os - US PELVIC COMPLETE WITH TRANSVAGINAL; Future  4. Yeast infection +yeast on CV swab, will rx diflucan 150 mg 1 now and repeat 1 in 3 days if needed   5. Breakthrough bleeding with IUD Has BTB at times   6. Elevated BP without diagnosis of hypertension Keep check on BP Decrease salt and sugars     Plan:     Return in 6 days for pelvic US in office

## 2023-02-17 DIAGNOSIS — H5213 Myopia, bilateral: Secondary | ICD-10-CM | POA: Diagnosis not present

## 2023-02-18 ENCOUNTER — Ambulatory Visit: Payer: Medicaid Other

## 2023-02-18 DIAGNOSIS — R1031 Right lower quadrant pain: Secondary | ICD-10-CM | POA: Diagnosis not present

## 2023-02-18 DIAGNOSIS — Z975 Presence of (intrauterine) contraceptive device: Secondary | ICD-10-CM | POA: Diagnosis not present

## 2023-02-18 NOTE — Progress Notes (Signed)
PELVIC US TA/TV: heterogeneous anteverted uterus,anterior mid intramural fibroid 1.8 x 1 x 1.5 cm,dominate simple follicle right ovary 2.3 x 2 x 1.8 cm,normal ovaries,ovaries appear mobile,EEC 4.2 mm,IUD is centrally located within the endometrium,no free fluid

## 2023-02-19 ENCOUNTER — Telehealth: Payer: Self-pay | Admitting: Adult Health

## 2023-02-19 DIAGNOSIS — D251 Intramural leiomyoma of uterus: Secondary | ICD-10-CM | POA: Insufficient documentation

## 2023-02-19 NOTE — Telephone Encounter (Signed)
Pt aware that US showed normal ovaries, and the right ovary has simple follicle and IUD is in position and there is a small fibroid in uterus. When [place] read by MD, can see results on MyChart

## 2023-03-03 ENCOUNTER — Other Ambulatory Visit: Payer: Self-pay

## 2023-03-03 ENCOUNTER — Emergency Department (HOSPITAL_COMMUNITY): Payer: Medicaid Other

## 2023-03-03 ENCOUNTER — Emergency Department (HOSPITAL_COMMUNITY)
Admission: EM | Admit: 2023-03-03 | Discharge: 2023-03-04 | Disposition: A | Discharge status: Home / Self Care | Payer: Medicaid Other | Attending: Student | Admitting: Student

## 2023-03-03 ENCOUNTER — Encounter (HOSPITAL_COMMUNITY): Payer: Self-pay | Admitting: Emergency Medicine

## 2023-03-03 DIAGNOSIS — I10 Essential (primary) hypertension: Secondary | ICD-10-CM | POA: Insufficient documentation

## 2023-03-03 DIAGNOSIS — L03011 Cellulitis of right finger: Secondary | ICD-10-CM | POA: Insufficient documentation

## 2023-03-03 DIAGNOSIS — M79646 Pain in unspecified finger(s): Secondary | ICD-10-CM | POA: Diagnosis present

## 2023-03-03 DIAGNOSIS — F17200 Nicotine dependence, unspecified, uncomplicated: Secondary | ICD-10-CM | POA: Diagnosis not present

## 2023-03-03 LAB — POC URINE PREG, ED: Preg Test, Ur: NEGATIVE

## 2023-03-03 NOTE — ED Triage Notes (Signed)
Pt was at work last night. Not sure if right 2nd digit injury happened at work or not. Pt woke up with finger hurting, swollen, and pink. Pt is able to bend finger. Pulses in tact.

## 2023-03-04 MED ORDER — CEPHALEXIN 500 MG PO CAPS
500.0000 mg | ORAL_CAPSULE | Freq: Once | ORAL | Status: AC
  Administered 2023-03-04: 500 mg via ORAL
  Filled 2023-03-04: qty 1

## 2023-03-04 MED ORDER — KETOROLAC TROMETHAMINE 15 MG/ML IJ SOLN
15.0000 mg | Freq: Once | INTRAMUSCULAR | Status: AC
  Administered 2023-03-04: 15 mg via INTRAMUSCULAR
  Filled 2023-03-04: qty 1

## 2023-03-04 MED ORDER — NAPROXEN 375 MG PO TABS: 375.0000 mg | ORAL_TABLET | Freq: Two times a day (BID) | ORAL | 0 refills | Status: DC

## 2023-03-04 MED ORDER — CEFADROXIL 500 MG PO CAPS: 500.0000 mg | ORAL_CAPSULE | Freq: Two times a day (BID) | ORAL | 0 refills | Status: AC

## 2023-03-04 NOTE — ED Provider Notes (Signed)
[name] [facility]Y DE[facility]MENT AT [facility] Provider Note  CSN: [id number] Arrival date & time: 03/03/23 2212  Chief Complaint(s) Finger Injury  HPI [name] is a 47 y.o. female with PMH anemia, HTN who presents emergency room for evaluation of finger pain and swelling.  Patient works as a Lawyer in a nursing home and states that she went to sleep and awoke with progressive worsening pain and swelling to the second digit on the right.  No associated trauma to the finger.  Denies associated fevers, numbness continuing, weakness of the upper extremity.  Denies chest pain, shortness of breath, Donnell pain, nausea, vomiting or other systemic symptoms.   Past Medical History Past Medical History:  Diagnosis Date   Anemia 05/31/2014   BV (bacterial vaginosis) 06/16/2015   Depression    Encounter for smoking cessation counseling 06/16/2015   HSV (herpes simplex virus) infection    Hypertension    Smoker 06/16/2015   Umbilical hernia 06/16/2015   Vaginal discharge 05/30/2014   Vitamin D deficiency 06/19/2015   Yeast infection 05/30/2014   Patient Active Problem List   Diagnosis Date Noted   Intramural uterine fibroid 02/19/2023   Yeast infection 02/12/2023   BV (bacterial vaginosis) 02/12/2023   RLQ discomfort 02/12/2023   Breakthrough bleeding with IUD 02/12/2023   Elevated BP without diagnosis of hypertension 02/12/2023   Onychomycosis of toenail 01/25/2023   Chronic right shoulder pain 11/02/2022   Pain of right hip 08/03/2022   Bunion of great toe of right foot 08/03/2022   Back pain of lumbar region with sciatica 06/16/2022   Chronic thumb pain, left 06/16/2022   Depression, recurrent (HCC) 06/16/2022   IUD (intrauterine device) in place 05/04/2021   Vitamin D deficiency 06/19/2015   Smoker 06/16/2015   Umbilical hernia without obstruction and without gangrene 06/16/2015   Encounter for insertion of Mirena IUD 06/16/2015   Anemia 05/31/2014   Visit for  routine gyn exam 11/15/2013   Herpes simplex type II infection 09/28/2012   Home Medication(s) Prior to Admission medications   Medication Sig Start Date End Date Taking? Authorizing Provider  cefadroxil (DURICEF) 500 MG capsule Take 1 capsule (500 mg total) by mouth 2 (two) times daily for 7 days. 03/04/23 03/11/23 Yes Tawanna Funk, MD  naproxen (NAPROSYN) 375 MG tablet Take 1 tablet (375 mg total) by mouth 2 (two) times daily. 03/04/23  Yes Kippy Gohman, MD  cetirizine (ZYRTEC) 10 MG tablet Take 10 mg by mouth daily.    [provider]  cholecalciferol (VITAMIN D3) 25 MCG (1000 UNIT) tablet Take 2,000 Units by mouth daily.    [provider]  ferrous sulfate 325 (65 FE) MG tablet Take 1 tablet (325 mg total) by mouth 2 (two) times daily with a meal. Patient taking differently: Take 325 mg by mouth daily. 07/08/17   Aliene Beams, MD  fluconazole (DIFLUCAN) 150 MG tablet Take 1 now and 1 in 3 days if needed 02/12/23   [name], NP  fluticasone [facility]) 50 MCG/ACT nasal spray Place 1 spray into both nostrils daily. 05/15/22   [provider]  levonorgestrel (MIRENA) 20 MCG/DAY IUD 1 each by Intrauterine route once. (Inserted 2021)    [provider]  metroNIDAZOLE (FLAGYL) 500 MG tablet Take 1 tablet (500 mg total) by mouth 2 (two) times daily. 02/12/23   [name], NP  Multiple Vitamins-Minerals (ONE-A-DAY WOMENS PO) Take 1 tablet by mouth daily.    [provider]  naproxen (NAPROSYN)  500 MG tablet Take 1 tablet (500 mg total) by mouth 2 (two) times daily as needed. 01/23/23   Gilmore Laroche, FNP  nicotine (NICODERM CQ - DOSED IN MG/24 HOURS) 14 mg/24hr patch Place 1 patch (14 mg total) onto the skin daily. 11/18/22   Autry-Lott, Randa Evens, DO  sertraline (ZOLOFT) 25 MG tablet Take 1 tablet (25 mg total) by mouth daily. 11/01/22   Gilmore Laroche, FNP  terbinafine (LAMISIL) 1 % cream Apply to the bottom and side of foot, the toe  nails twice daily for 2 weeks 01/23/23   Gilmore Laroche, FNP  vitamin C (ASCORBIC ACID) 500 MG tablet Take 500 mg by mouth daily.    [provider]                                                                                                                                    Past Surgical History Past Surgical History:  Procedure Laterality Date   CESAREAN SECTION     COLONOSCOPY WITH PROPOFOL N/A 07/04/2022   Procedure: COLONOSCOPY WITH PROPOFOL;  Surgeon: Lanelle Bal, DO;  [facility];  Service: Endoscopy;  Laterality: N/A;  11;00 am, asa 2   POLYPECTOMY  07/04/2022   Procedure: POLYPECTOMY;  Surgeon: Lanelle Bal, DO;  Location: [facility];  Service: Endoscopy;;   TUBAL LIGATION     Family History Family History  Problem Relation Age of Onset   Alzheimer's disease Maternal Grandmother    Cancer Maternal Grandfather    Kidney disease Father    Hypertension Mother    Hypertension Brother    Sickle cell anemia Brother    Other Brother        born with fluid on brain   Asthma Son     Social History Social History   Tobacco Use   Smoking status: Every Day    Current packs/day: 0.50    Average packs/day: 0.5 packs/day for 18.0 years (9.0 ttl pk-yrs)    Types: Cigarettes   Smokeless tobacco: Never   Tobacco comments:    1/2 a day.  Vaping Use   Vaping status: Never Used  Substance Use Topics   Alcohol use: No   Drug use: Not Currently    Types: Marijuana    Comment: none since ine her 20s   Allergies Lisinopril  Review of Systems Review of Systems  Musculoskeletal:  Positive for arthralgias, joint swelling and myalgias.    Physical Exam Vital Signs  I have reviewed the triage vital signs BP 118/67 (BP Location: Left Arm)   Pulse 65   Temp 99.3 F (37.4 C) (Oral)   Resp 16   Ht 5' (1.524 m)   Wt 64.4 kg   LMP 02/05/2023 (Approximate)   SpO2 98%   BMI 27.73 kg/m   Physical Exam Vitals and nursing note reviewed.   Constitutional:      General: She is not in  acute distress.    Appearance: She is well-developed.  HENT:     Head: Normocephalic and atraumatic.  Eyes:     Conjunctiva/sclera: Conjunctivae normal.  Cardiovascular:     Rate and Rhythm: Normal rate and regular rhythm.     Heart sounds: No murmur heard. Pulmonary:     Effort: Pulmonary effort is normal. No respiratory distress.     Breath sounds: Normal breath sounds.  Abdominal:     Palpations: Abdomen is soft.     Tenderness: There is no abdominal tenderness.  Musculoskeletal:        General: Swelling and tenderness present.     Cervical back: Neck supple.  Skin:    General: Skin is warm and dry.     Capillary Refill: Capillary refill takes less than 2 seconds.     Findings: Erythema present.  Neurological:     Mental Status: She is alert.  Psychiatric:        Mood and Affect: Mood normal.     ED Results and Treatments Labs (all labs ordered are listed, but only abnormal results are displayed) Labs Reviewed  POC URINE PREG, ED                                                                                                                          Radiology DG Finger Index Right  Result Date: 03/04/2023 CLINICAL DATA:  Painful and swollen second right finger. EXAM: RIGHT INDEX FINGER 2+V COMPARISON:  June 18, 2022 FINDINGS: There is no evidence of an acute fracture or dislocation. Stable chronic changes are seen involving the base of the distal phalanx of the second right finger, along the DIP joint. Mild, diffuse soft tissue swelling is seen. IMPRESSION: Mild, diffuse soft tissue swelling without evidence of an acute osseous abnormality. Electronically Signed   By: Aram Candela M.D.   On: 03/04/2023 03:04    Pertinent labs & imaging results that were available during my care of the patient were reviewed by me and considered in my medical decision making (see MDM for details).  Medications Ordered in  ED Medications  cephALEXin (KEFLEX) capsule 500 mg (500 mg Oral Given 03/04/23 0213)  ketorolac (TORADOL) 15 MG/ML injection 15 mg (15 mg Intramuscular Given 03/04/23 0245)  Procedures Procedures  (including critical care time)  Medical Decision Making / ED Course   This patient presents to the ED for concern of finger pain and swelling, this involves an extensive number of treatment options, and is a complaint that carries with it a high risk of complications and morbidity.  The differential diagnosis includes fracture, contusion, ligamentous injury, cellulitis, FTS, gout  MDM: Patient seen emergency room for evaluation of finger pain and swelling.  Physical exam with localized swelling to the second digit on the right but no tenderness in the palmar surface of the hand and lower suspicion for FTS.  Slight erythema seen.  X-ray imaging with diffuse soft tissue swelling but no fracture.  Suspect developing cellulitis and will cover with antibiotics.  At this time she does not meet inpatient criteria for admission and she is safe for discharge with outpatient follow-up.  Return precautions given which she voiced understanding.   Additional history obtained:  -External records from outside source obtained and reviewed including: Chart review including previous notes, labs, imaging, consultation notes   Lab Tests: -I ordered, reviewed, and interpreted labs.   The pertinent results include:   Labs Reviewed  POC URINE PREG, ED       Imaging Studies ordered: I ordered imaging studies including x-ray of finger I independently visualized and interpreted imaging. I agree with the radiologist interpretation   Medicines ordered and prescription drug management: Meds ordered this encounter  Medications   cephALEXin (KEFLEX) capsule 500 mg   ketorolac  (TORADOL) 15 MG/ML injection 15 mg   naproxen (NAPROSYN) 375 MG tablet    Sig: Take 1 tablet (375 mg total) by mouth 2 (two) times daily.    Dispense:  20 tablet    Refill:  0   cefadroxil (DURICEF) 500 MG capsule    Sig: Take 1 capsule (500 mg total) by mouth 2 (two) times daily for 7 days.    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   Reevaluation: After the interventions noted above, I reevaluated the patient and found that they have :improved  Co morbidities that complicate the patient evaluation  Past Medical History:  Diagnosis Date   Anemia 05/31/2014   BV (bacterial vaginosis) 06/16/2015   Depression    Encounter for smoking cessation counseling 06/16/2015   HSV (herpes simplex virus) infection    Hypertension    Smoker 06/16/2015   Umbilical hernia 06/16/2015   Vaginal discharge 05/30/2014   Vitamin D deficiency 06/19/2015   Yeast infection 05/30/2014      Dispostion: I considered admission for this patient, but at this time she does not meet inpatient criteria for admission she is safe for discharge with outpatient follow-up     Final Clinical Impression(s) / ED Diagnoses Final diagnoses:  Cellulitis of finger of right hand     @PCDICTATION @    Glendora Score, MD 03/04/23 1836

## 2023-03-05 ENCOUNTER — Ambulatory Visit: Payer: Medicaid Other | Admitting: Family Medicine

## 2023-03-05 ENCOUNTER — Encounter: Payer: Self-pay | Admitting: Family Medicine

## 2023-03-05 VITALS — BP 115/75 | HR 69 | Ht 60.0 in | Wt 141.1 lb

## 2023-03-05 DIAGNOSIS — L03011 Cellulitis of right finger: Secondary | ICD-10-CM | POA: Diagnosis not present

## 2023-03-05 NOTE — Progress Notes (Signed)
Established Patient Office Visit  Subjective:  Patient ID: [name], female    DOB: 01-27-1976  Age: 47 y.o. MRN: [id number]  CC:  Chief Complaint  Patient presents with   Care Management    Follow up appt, needs a work note to excuse her for cellulitis problem.    Hand Pain    Pt reports cellulitis on her right hand (pointer finger) started on Monday (2 days ago).  Went to ER they gave her antibiotic and pain medication, hasn't seen much of a difference.     HPI [name] is a 47 y.o. female presents for ED f/u.  Cellulitis of Right Finger: The patient was seen in the ED on 03/03/2023 for evaluation of finger pain and swelling. Imaging was negative for fracture but showed diffuse soft tissue swelling. Physical examination showed localized swelling of the second digit on the right hand, with no tenderness on the palmar surface. The patient was started on oral cefadroxil 500 mg twice daily for 7 days and was discharged with naproxen to be taken twice daily for pain relief.  Today, the patient notes that the swelling has not subsided and reports mild pain in the affected finger. She is able to moderately move the joints in the affected finger. She is requesting a work note due to the swelling. She denies fever, chills, numbness, weakness of the upper extremity, chest pain, shortness of breath, nausea, vomiting, or other systemic symptoms.   Past Medical History:  Diagnosis Date   Anemia 05/31/2014   BV (bacterial vaginosis) 06/16/2015   Depression    Encounter for smoking cessation counseling 06/16/2015   HSV (herpes simplex virus) infection    Hypertension    Smoker 06/16/2015   Umbilical hernia 06/16/2015   Vaginal discharge 05/30/2014   Vitamin D deficiency 06/19/2015   Yeast infection 05/30/2014    Past Surgical History:  Procedure Laterality Date   CESAREAN SECTION     COLONOSCOPY WITH PROPOFOL N/A 07/04/2022   Procedure: COLONOSCOPY WITH PROPOFOL;  Surgeon:  Lanelle Bal, DO;  [facility];  Service: Endoscopy;  Laterality: N/A;  11;00 am, asa 2   POLYPECTOMY  07/04/2022   Procedure: POLYPECTOMY;  Surgeon: Lanelle Bal, DO;  [facility];  Service: Endoscopy;;   TUBAL LIGATION      Family History  Problem Relation Age of Onset   Alzheimer's disease Maternal Grandmother    Cancer Maternal Grandfather    Kidney disease Father    Hypertension Mother    Hypertension Brother    Sickle cell anemia Brother    Other Brother        born with fluid on brain   Asthma Son     Social History   Socioeconomic History   Marital status: Legally Separated    Spouse name: Not on file   Number of children: 3   Years of education: Not on file   Highest education level: 10th grade  Occupational History   Not on file  Tobacco Use   Smoking status: Every Day    Current packs/day: 0.50    Average packs/day: 0.5 packs/day for 18.0 years (9.0 ttl pk-yrs)    Types: Cigarettes   Smokeless tobacco: Never   Tobacco comments:    1/2 a day.  Vaping Use   Vaping status: Never Used  Substance and Sexual Activity   Alcohol use: No   Drug use: Not Currently    Types: Marijuana  Comment: none since ine her 29s   Sexual activity: Yes    Partners: Male    Birth control/protection: Surgical, I.U.D.    Comment: tubal  Other Topics Concern   Not on file  Social History Narrative   Works at [employer].    Social Determinants of Health   Financial Resource Strain: High Risk (05/28/2022)   Overall Financial Resource Strain (CARDIA)    Difficulty of Paying Living Expenses: Hard  Food Insecurity: Food Insecurity Present (05/28/2022)   Hunger Vital Sign    Worried About Running Out of Food in the Last Year: Sometimes true    Ran Out of Food in the Last Year: Sometimes true  Transportation Needs: No Transportation Needs (09/28/2021)   PRAPARE - Administrator, Civil Service (Medical): No    Lack of Transportation  (Non-Medical): No  Physical Activity: Inactive (05/28/2022)   Exercise Vital Sign    Days of Exercise per Week: 0 days    Minutes of Exercise per Session: 0 min  Stress: Stress Concern Present (05/28/2022)   [employer] of Occupational Health - Occupational Stress Questionnaire    Feeling of Stress : Rather much  Social Connections: Moderately Isolated (05/28/2022)   Social Connection and Isolation Panel [NHANES]    Frequency of Communication with Friends and Family: More than three times a week    Frequency of Social Gatherings with Friends and Family: Once a week    Attends Religious Services: 1 to 4 times per year    Active Member of [employer] or Organizations: No    Attends Banker Meetings: Never    Marital Status: Separated  Intimate Partner Violence: Not At Risk (05/28/2022)   Humiliation, Afraid, Rape, and Kick questionnaire    Fear of Current or Ex-Partner: No    Emotionally Abused: No    Physically Abused: No    Sexually Abused: No    Outpatient Medications Prior to Visit  Medication Sig Dispense Refill   cefadroxil (DURICEF) 500 MG capsule Take 1 capsule (500 mg total) by mouth 2 (two) times daily for 7 days. 14 capsule 0   cetirizine (ZYRTEC) 10 MG tablet Take 10 mg by mouth daily.     cholecalciferol (VITAMIN D3) 25 MCG (1000 UNIT) tablet Take 2,000 Units by mouth daily.     ferrous sulfate 325 (65 FE) MG tablet Take 1 tablet (325 mg total) by mouth 2 (two) times daily with a meal. (Patient taking differently: Take 325 mg by mouth daily.) 60 tablet 3   fluconazole (DIFLUCAN) 150 MG tablet Take 1 now and 1 in 3 days if needed 2 tablet 1   fluticasone (FLONASE) 50 MCG/ACT nasal spray Place 1 spray into both nostrils daily.     levonorgestrel (MIRENA) 20 MCG/DAY IUD 1 each by Intrauterine route once. (Inserted 2021)     metroNIDAZOLE (FLAGYL) 500 MG tablet Take 1 tablet (500 mg total) by mouth 2 (two) times daily. 14 tablet 0   Multiple Vitamins-Minerals (ONE-A-DAY  WOMENS PO) Take 1 tablet by mouth daily.     naproxen (NAPROSYN) 375 MG tablet Take 1 tablet (375 mg total) by mouth 2 (two) times daily. 20 tablet 0   naproxen (NAPROSYN) 500 MG tablet Take 1 tablet (500 mg total) by mouth 2 (two) times daily as needed. 60 tablet 0   nicotine (NICODERM CQ - DOSED IN MG/24 HOURS) 14 mg/24hr patch Place 1 patch (14 mg total) onto the skin daily. 48 patch 0   sertraline (  ZOLOFT) 25 MG tablet Take 1 tablet (25 mg total) by mouth daily. 30 tablet 3   terbinafine (LAMISIL) 1 % cream Apply to the bottom and side of foot, the toe nails twice daily for 2 weeks 42 g 0   vitamin C (ASCORBIC ACID) 500 MG tablet Take 500 mg by mouth daily.     No facility-administered medications prior to visit.    Allergies  Allergen Reactions   Lisinopril Cough    ROS Review of Systems  Constitutional:  Negative for chills and fever.  Eyes:  Negative for visual disturbance.  Respiratory:  Negative for chest tightness and shortness of breath.   Musculoskeletal:        Swelling of the second finger of right hand  Neurological:  Negative for dizziness and headaches.      Objective:    Physical Exam HENT:     Head: Normocephalic.     Mouth/Throat:     Mouth: Mucous membranes are moist.  Cardiovascular:     Rate and Rhythm: Normal rate.     Heart sounds: Normal heart sounds.  Pulmonary:     Effort: Pulmonary effort is normal.     Breath sounds: Normal breath sounds.  Musculoskeletal:     Comments: localized swelling of the second digit on the right hand  Neurological:     Mental Status: She is alert.     BP 115/75   Pulse 69   Ht 5' (1.524 m)   Wt 141 lb 1.9 oz (64 kg)   LMP 02/05/2023 (Approximate)   SpO2 98%   BMI 27.56 kg/m  Wt Readings from Last 3 Encounters:  03/05/23 141 lb 1.9 oz (64 kg)  03/03/23 142 lb (64.4 kg)  02/12/23 141 lb (64 kg)    Lab Results  Component Value Date   TSH 1.180 11/04/2022   Lab Results  Component Value Date   WBC  5.2 11/04/2022   HGB 12.3 11/04/2022   HCT 38.2 11/04/2022   MCV 84 11/04/2022   PLT 183 11/04/2022   Lab Results  Component Value Date   NA 138 11/04/2022   K 3.9 11/04/2022   CO2 21 11/04/2022   GLUCOSE 88 11/04/2022   BUN 14 11/04/2022   CREATININE 0.74 11/04/2022   BILITOT <0.2 11/04/2022   ALKPHOS 55 11/04/2022   AST 18 11/04/2022   ALT 13 11/04/2022   PROT 6.6 11/04/2022   ALBUMIN 4.1 11/04/2022   CALCIUM 8.9 11/04/2022   ANIONGAP 10 10/02/2019   EGFR 102 11/04/2022   Lab Results  Component Value Date   CHOL 153 11/04/2022   Lab Results  Component Value Date   HDL 45 11/04/2022   Lab Results  Component Value Date   LDLCALC 91 11/04/2022   Lab Results  Component Value Date   TRIG 88 11/04/2022   Lab Results  Component Value Date   CHOLHDL 3.4 11/04/2022   Lab Results  Component Value Date   HGBA1C 5.7 (H) 11/04/2022      Assessment & Plan:  Cellulitis of right finger Assessment & Plan: Encouraged to complete the full 7-day course of the prescribed antibiotic. Encouraged to continue taking naproxen as needed for pain relief. Advised supportive care, including rest, ice application, elevation to decrease swelling, and gentle exercises. Encouraged to follow up if symptoms worsen.    Note: This chart has been completed using Engineer, civil (consulting) software, and while attempts have been made to ensure accuracy, certain words and phrases may not  be transcribed as intended.    Follow-up: No follow-ups on file.   Gilmore Laroche, FNP

## 2023-03-05 NOTE — Patient Instructions (Addendum)
I appreciate the opportunity to provide care to you today!  -Please complete the full 7-day course of the prescribed antibiotic. - taking naproxen as needed for pain relief.  For managing finger pain and swelling, consider these nonpharmacological interventions: -Rest and Protection: Avoid activities that stress the finger. Using a splint or brace can stabilize the joint, allowing it to heal. -Ice Therapy: Apply ice for 15-20 minutes every 2-3 hours during the first 24-48 hours to reduce swelling and pain. Wrap ice in a cloth to avoid direct skin contact. -Heat Therapy: After initial swelling subsides, using a warm compress or soaking the finger in warm water can help relax muscles and improve blood flow. -Elevation: Keep the hand elevated above heart level whenever possible to reduce swelling. -Gentle Stretching and Range-of-Motion Exercises: Once pain allows, gently move the finger to maintain flexibility and reduce stiffness. Avoid overextension. -Massage: Lightly massaging the area may help to reduce stiffness and improve circulation. -Dietary Support: Eating an anti-inflammatory diet rich in fruits, vegetables, and omega-3 fatty acids may support overall inflammation reduction. -Hydration: Staying well-hydrated supports joint health and reduces inflammation. -Mindfulness and Relaxation: If pain contributes to stress or tension, relaxation techniques, such as deep breathing or meditation, may provide indirect relief.     Please continue to a heart-healthy diet and increase your physical activities. Try to exercise for at least five days a week.    It was a pleasure to see you and I look forward to continuing to work together on your health and well-being. Please do not hesitate to call the office if you need care or have questions about your care.  In case of emergency, please visit the Emergency Department for urgent care, or contact our clinic at [phone] to schedule an  appointment. We're here to help you!   Have a wonderful day and week. With Gratitude, Gilmore Laroche MSN, FNP-BC

## 2023-03-05 NOTE — Assessment & Plan Note (Addendum)
Encouraged to complete the full 7-day course of the prescribed antibiotic. Encouraged to continue taking naproxen as needed for pain relief. Advised supportive care, including rest, ice application, elevation to decrease swelling, and gentle exercises. Encouraged to follow up if symptoms worsen.

## 2023-03-06 ENCOUNTER — Other Ambulatory Visit: Payer: Self-pay | Admitting: Family Medicine

## 2023-03-06 DIAGNOSIS — F339 Major depressive disorder, recurrent, unspecified: Secondary | ICD-10-CM

## 2023-04-15 ENCOUNTER — Other Ambulatory Visit: Payer: Self-pay | Admitting: Family Medicine

## 2023-04-15 DIAGNOSIS — F339 Major depressive disorder, recurrent, unspecified: Secondary | ICD-10-CM

## 2023-04-15 NOTE — Patient Instructions (Signed)
        Great to see you today.  I have refilled the medication(s) we provide.    - Please take medications as prescribed. - Follow up with your primary health provider if any health concerns arises. - If symptoms worsen please contact your primary care provider and/or visit the emergency department.  

## 2023-04-15 NOTE — Telephone Encounter (Signed)
Copied from CRM [id number]. Topic: Clinical - Medication Refill >> Apr 15, 2023  9:45 AM Bobbye Morton wrote: Most Recent Primary Care Visit:  Provider: Gilmore Laroche  Department: [facility]  Visit Type: OFFICE VISIT  Date: 03/05/2023  Medication: sertraline (ZOLOFT) 25 MG tablet  Has the patient contacted their pharmacy? No (Agent: If no, request that the patient contact the pharmacy for the refill. If patient does not wish to contact the pharmacy document the reason why and proceed with request.) (Agent: If yes, when and what did the pharmacy advise?)  Is this the correct pharmacy for this prescription? Yes If no, delete pharmacy and type the correct one.  This is the patient's preferred pharmacy:  [facility] [address] [place] - [address] [place] [address] [place] [address] Phone: [phone] Fax: [phone]   Has the prescription been filled recently? No  Is the patient out of the medication? Yes  Has the patient been seen for an appointment in the last year OR does the patient have an upcoming appointment? Yes  Can we respond through MyChart? Yes  Agent: Please be advised that Rx refills may take up to 3 business days. We ask that you follow-up with your pharmacy.

## 2023-04-15 NOTE — Progress Notes (Unsigned)
   Established Patient Office Visit   Subjective  Patient ID: [name], female    DOB: Mar 08, 1976  Age: 47 y.o. MRN: [id number]  No chief complaint on file.   She  has a past medical history of Anemia (05/31/2014), BV (bacterial vaginosis) (06/16/2015), Depression, Encounter for smoking cessation counseling (06/16/2015), HSV (herpes simplex virus) infection, Hypertension, Smoker (06/16/2015), Umbilical hernia (06/16/2015), Vaginal discharge (05/30/2014), Vitamin D deficiency (06/19/2015), and Yeast infection (05/30/2014).  Ear Fullness     ROS    Objective:     There were no vitals taken for this visit. {Vitals History (Optional):23777}  Physical Exam   No results found for any visits on 04/17/23.  The 10-year ASCVD risk score (Arnett DK, et al., 2019) is: 1.7%    Assessment & Plan:  There are no diagnoses linked to this encounter.  No follow-ups on file.   Cruzita Lederer Newman Nip, FNP

## 2023-04-17 ENCOUNTER — Encounter: Payer: Self-pay | Admitting: Family Medicine

## 2023-04-17 ENCOUNTER — Ambulatory Visit: Payer: Self-pay | Admitting: Family Medicine

## 2023-04-17 VITALS — BP 130/82 | HR 76 | Ht 60.0 in | Wt 138.1 lb

## 2023-04-17 DIAGNOSIS — H6990 Unspecified Eustachian tube disorder, unspecified ear: Secondary | ICD-10-CM | POA: Insufficient documentation

## 2023-04-17 DIAGNOSIS — H6992 Unspecified Eustachian tube disorder, left ear: Secondary | ICD-10-CM

## 2023-04-17 MED ORDER — PREDNISONE 20 MG PO TABS: 20.0000 mg | ORAL_TABLET | Freq: Two times a day (BID) | ORAL | 0 refills | Status: AC

## 2023-04-17 MED ORDER — LEVOCETIRIZINE DIHYDROCHLORIDE 5 MG PO TABS: 5.0000 mg | ORAL_TABLET | Freq: Every evening | ORAL | 1 refills | Status: DC

## 2023-04-17 MED ORDER — ONDANSETRON HCL 4 MG PO TABS: 4.0000 mg | ORAL_TABLET | Freq: Three times a day (TID) | ORAL | 0 refills | Status: DC | PRN

## 2023-04-17 NOTE — Assessment & Plan Note (Signed)
Prednisone 20 mg twice daily x 5 days Discussed to stay well-hydrated and try simple techniques like swallowing, yawning, or chewing gum to help equalize pressure in the ears and open the Eustachian tubes. Using a warm compress over the affected ear may also provide relief. Avoid exposure to irritants like cigarette smoke, allergens, or sudden pressure changes, such as during flights or diving. If you are experiencing nasal congestion or allergies, consider over-the-counter remedies like saline nasal sprays or antihistamines to reduce symptoms

## 2023-04-29 ENCOUNTER — Ambulatory Visit: Payer: Medicaid Other | Admitting: Family Medicine

## 2023-05-13 ENCOUNTER — Other Ambulatory Visit: Payer: Self-pay | Admitting: Family Medicine

## 2023-05-13 DIAGNOSIS — F339 Major depressive disorder, recurrent, unspecified: Secondary | ICD-10-CM

## 2023-05-21 NOTE — Patient Instructions (Signed)

## 2023-05-21 NOTE — Progress Notes (Signed)
Established Patient Office Visit   Subjective  Patient ID: [name], female    DOB: June 11, 1975  Age: 48 y.o. MRN: [id number]  Chief Complaint  Patient presents with   Follow-up    3 month f/u Sinus pressure, nasal and ear congestion , coughx 2 wk     She  has a past medical history of Anemia (05/31/2014), BV (bacterial vaginosis) (06/16/2015), Depression, Encounter for smoking cessation counseling (06/16/2015), HSV (herpes simplex virus) infection, Hypertension, Smoker (06/16/2015), Umbilical hernia (06/16/2015), Vaginal discharge (05/30/2014), Vitamin D deficiency (06/19/2015), and Yeast infection (05/30/2014).  Patient complains of persistent cough. Patient describes symptoms of cough, sinus  pressure, fatigue, malaise, myalgias, sore throat, and sputum production. Symptoms began a few weeks ago and are gradually improving since that time. Patient denies dyspnea or chest pain. Treatment thus far includes OTC nasal spray Past pulmonary history is significant for no history of pneumonia or bronchitis     Review of Systems  Constitutional:  Positive for malaise/fatigue. Negative for chills and fever.  HENT:  Positive for congestion, ear pain, sinus pain and sore throat.   Respiratory:  Positive for cough and shortness of breath.   Cardiovascular:  Negative for chest pain.  Neurological:  Negative for dizziness and headaches.      Objective:     BP 116/78   Pulse 76   Ht 5' (1.524 m)   Wt 142 lb 0.6 oz (64.4 kg)   LMP  (LMP Unknown)   SpO2 98%   BMI 27.74 kg/m  BP Readings from Last 3 Encounters:  05/23/23 116/78  04/17/23 130/82  03/05/23 115/75      Physical Exam Vitals reviewed.  Constitutional:      General: She is not in acute distress.    Appearance: Normal appearance. She is not ill-appearing, toxic-appearing or diaphoretic.  HENT:     Head: Normocephalic.     Right Ear: Tympanic membrane normal.     Left Ear: Tympanic membrane normal.     Nose:  Congestion and rhinorrhea present.     Mouth/Throat:     Pharynx: Posterior oropharyngeal erythema present.  Eyes:     General:        Right eye: No discharge.        Left eye: No discharge.     Conjunctiva/sclera: Conjunctivae normal.  Cardiovascular:     Rate and Rhythm: Normal rate.     Pulses: Normal pulses.     Heart sounds: Normal heart sounds.  Pulmonary:     Effort: Pulmonary effort is normal. No respiratory distress.     Breath sounds: Normal breath sounds.  Skin:    General: Skin is warm and dry.     Capillary Refill: Capillary refill takes less than 2 seconds.  Neurological:     General: No focal deficit present.     Mental Status: She is alert and oriented to person, place, and time.     Coordination: Coordination normal.     Gait: Gait normal.  Psychiatric:        Mood and Affect: Mood normal.        Behavior: Behavior normal.      No results found for any visits on 05/23/23.  The 10-year ASCVD risk score (Arnett DK, et al., 2019) is: 1.7%    Assessment & Plan:  Encounter for screening for cardiovascular disorders -     Lipid panel -     BMP8+eGFR -     CBC with Differential/Platelet  Prediabetes Assessment & Plan: Hemoglobin A1C labs ordered  Discussed Pre-Diabetes Diet Eat More: Whole grains: Oats, quinoa, brown rice. Vegetables: Leafy greens, broccoli, green beans. Fruits: Low-sugar like berries, apples. Lean proteins: Chicken, fish, beans, eggs. Healthy fats: Nuts, seeds, avocado, olive oil. Limit: Refined carbs: White bread, pastries. Sugary foods: Soda, candy, desserts. Fried and fatty foods. Tips: Eat balanced meals with portion control. Stay hydrated (water over sugary drinks). Pair with regular exercise (e.g., walking). Example: Grilled chicken, quinoa, and steamed broccoli. Combine diet with regular physical activity (e.g., 30 minutes of walking daily or 5 times per week.)   Orders: -     Hemoglobin A1c  Iron deficiency anemia,  unspecified iron deficiency anemia type -     Iron, TIBC and Ferritin Panel -     Vitamin B12  Frontal sinusitis, unspecified chronicity Assessment & Plan: Azithromycin 250 mg twice daily x 5 days,  Advise patient to rest to support your body's recovery. Stay hydrated by drinking water, tea, or broth. Using a humidifier can help soothe throat irritation and ease nasal congestion. For fever or pain, acetaminophen (Tylenol) is recommended. To relieve other symptoms, try saline nasal sprays, throat lozenges, or gargling with saltwater. Focus on eating light, healthy meals like fruits and vegetables to keep your strength up. Practice good hygiene by washing your hands frequently and covering your mouth when coughing or sneezing.Follow-up for worsening or persistent symptoms. Patient verbalizes understanding regarding plan of care and all questions answered      Orders: -     Azelastine HCl; Place 1 spray into both nostrils 2 (two) times daily. Use in each nostril as directed  Dispense: 30 mL; Refill: 2 -     Promethazine-DM; Take 5 mLs by mouth 4 (four) times daily as needed.  Dispense: 118 mL; Refill: 0 -     Azithromycin; Take 2 tablets on day 1, then 1 tablet daily on days 2 through 5  Dispense: 6 tablet; Refill: 0    Return in about 6 months (around 11/20/2023), or if symptoms worsen or fail to improve, for Follow up with PCP.   Cruzita Lederer Newman Nip, FNP

## 2023-05-23 ENCOUNTER — Ambulatory Visit: Payer: BC Managed Care – PPO | Admitting: Family Medicine

## 2023-05-23 ENCOUNTER — Encounter: Payer: Self-pay | Admitting: Family Medicine

## 2023-05-23 VITALS — BP 116/78 | HR 76 | Ht 60.0 in | Wt 142.0 lb

## 2023-05-23 DIAGNOSIS — R7303 Prediabetes: Secondary | ICD-10-CM | POA: Diagnosis not present

## 2023-05-23 DIAGNOSIS — D509 Iron deficiency anemia, unspecified: Secondary | ICD-10-CM | POA: Diagnosis not present

## 2023-05-23 DIAGNOSIS — Z136 Encounter for screening for cardiovascular disorders: Secondary | ICD-10-CM

## 2023-05-23 DIAGNOSIS — J321 Chronic frontal sinusitis: Secondary | ICD-10-CM

## 2023-05-23 DIAGNOSIS — J329 Chronic sinusitis, unspecified: Secondary | ICD-10-CM | POA: Insufficient documentation

## 2023-05-23 MED ORDER — AZELASTINE HCL 0.1 % NA SOLN: 1.0000 | Freq: Two times a day (BID) | NASAL | 2 refills | Status: DC

## 2023-05-23 MED ORDER — PROMETHAZINE-DM 6.25-15 MG/5ML PO SYRP: 5.0000 mL | ORAL_SOLUTION | Freq: Four times a day (QID) | ORAL | 0 refills | Status: DC | PRN

## 2023-05-23 MED ORDER — AZITHROMYCIN 250 MG PO TABS: ORAL_TABLET | ORAL | 0 refills | Status: DC

## 2023-05-23 NOTE — Assessment & Plan Note (Addendum)
Hemoglobin A1C labs ordered  Discussed Pre-Diabetes Diet Eat More: Whole grains: Oats, quinoa, brown rice. Vegetables: Leafy greens, broccoli, green beans. Fruits: Low-sugar like berries, apples. Lean proteins: Chicken, fish, beans, eggs. Healthy fats: Nuts, seeds, avocado, olive oil. Limit: Refined carbs: White bread, pastries. Sugary foods: Soda, candy, desserts. Fried and fatty foods. Tips: Eat balanced meals with portion control. Stay hydrated (water over sugary drinks). Pair with regular exercise (e.g., walking). Example: Grilled chicken, quinoa, and steamed broccoli. Combine diet with regular physical activity (e.g., 30 minutes of walking daily or 5 times per week.)

## 2023-05-23 NOTE — Assessment & Plan Note (Signed)
 Azithromycin 250 mg twice daily x 5 days,  Advise patient to rest to support your body's recovery. Stay hydrated by drinking water, tea, or broth. Using a humidifier can help soothe throat irritation and ease nasal congestion. For fever or pain, acetaminophen (Tylenol) is recommended. To relieve other symptoms, try saline nasal sprays, throat lozenges, or gargling with saltwater. Focus on eating light, healthy meals like fruits and vegetables to keep your strength up. Practice good hygiene by washing your hands frequently and covering your mouth when coughing or sneezing.Follow-up for worsening or persistent symptoms. Patient verbalizes understanding regarding plan of care and all questions answered

## 2023-05-27 DIAGNOSIS — D509 Iron deficiency anemia, unspecified: Secondary | ICD-10-CM | POA: Diagnosis not present

## 2023-05-27 DIAGNOSIS — Z136 Encounter for screening for cardiovascular disorders: Secondary | ICD-10-CM | POA: Diagnosis not present

## 2023-05-27 DIAGNOSIS — R7303 Prediabetes: Secondary | ICD-10-CM | POA: Diagnosis not present

## 2023-05-28 ENCOUNTER — Encounter: Payer: Self-pay | Admitting: Family Medicine

## 2023-05-28 LAB — IRON,TIBC AND FERRITIN PANEL
Ferritin: 304 ng/mL — ABNORMAL HIGH (ref 15–150)
Iron Saturation: 35 % (ref 15–55)
Iron: 115 ug/dL (ref 27–159)
Total Iron Binding Capacity: 327 ug/dL (ref 250–450)
UIBC: 212 ug/dL (ref 131–425)

## 2023-05-28 LAB — BMP8+EGFR
BUN/Creatinine Ratio: 20 (ref 9–23)
BUN: 14 mg/dL (ref 6–24)
CO2: 24 mmol/L (ref 20–29)
Calcium: 9.3 mg/dL (ref 8.7–10.2)
Chloride: 105 mmol/L (ref 96–106)
Creatinine, Ser: 0.7 mg/dL (ref 0.57–1.00)
Glucose: 89 mg/dL (ref 70–99)
Potassium: 4.4 mmol/L (ref 3.5–5.2)
Sodium: 140 mmol/L (ref 134–144)
eGFR: 108 mL/min/{1.73_m2} (ref >59)

## 2023-05-28 LAB — CBC WITH DIFFERENTIAL/PLATELET
Basophils Absolute: 0 10*3/uL (ref 0.0–0.2)
Basos: 0 %
EOS (ABSOLUTE): 0 10*3/uL (ref 0.0–0.4)
Eos: 1 %
Hematocrit: 41.8 % (ref 34.0–46.6)
Hemoglobin: 13.5 g/dL (ref 11.1–15.9)
Immature Grans (Abs): 0 10*3/uL (ref 0.0–0.1)
Immature Granulocytes: 0 %
Lymphocytes Absolute: 2.2 10*3/uL (ref 0.7–3.1)
Lymphs: 44 %
MCH: 27.7 pg (ref 26.6–33.0)
MCHC: 32.3 g/dL (ref 31.5–35.7)
MCV: 86 fL (ref 79–97)
Monocytes Absolute: 0.4 10*3/uL (ref 0.1–0.9)
Monocytes: 8 %
Neutrophils Absolute: 2.3 10*3/uL (ref 1.4–7.0)
Neutrophils: 47 %
Platelets: 243 10*3/uL (ref 150–450)
RBC: 4.88 x10E6/uL (ref 3.77–5.28)
RDW: 13.7 % (ref 11.7–15.4)
WBC: 4.9 10*3/uL (ref 3.4–10.8)

## 2023-05-28 LAB — LIPID PANEL
Chol/HDL Ratio: 3.8 {ratio} (ref 0.0–4.4)
Cholesterol, Total: 158 mg/dL (ref 100–199)
HDL: 42 mg/dL (ref >39)
LDL Chol Calc (NIH): 104 mg/dL — ABNORMAL HIGH (ref 0–99)
Triglycerides: 62 mg/dL (ref 0–149)
VLDL Cholesterol Cal: 12 mg/dL (ref 5–40)

## 2023-05-28 LAB — HEMOGLOBIN A1C
Est. average glucose Bld gHb Est-mCnc: 111 mg/dL
Hgb A1c MFr Bld: 5.5 % (ref 4.8–5.6)

## 2023-05-28 LAB — VITAMIN B12: Vitamin B-12: 635 pg/mL (ref 232–1245)

## 2023-05-29 ENCOUNTER — Other Ambulatory Visit: Payer: Self-pay | Admitting: Family Medicine

## 2023-05-29 ENCOUNTER — Telehealth: Payer: Self-pay

## 2023-05-29 DIAGNOSIS — J321 Chronic frontal sinusitis: Secondary | ICD-10-CM

## 2023-05-29 MED ORDER — AZELASTINE HCL 0.1 % NA SOLN: 1.0000 | Freq: Two times a day (BID) | NASAL | 2 refills | Status: AC

## 2023-05-29 NOTE — Telephone Encounter (Signed)
 Copied from CRM [id number]. Topic: Clinical - Lab/Test Results >> May 28, 2023  2:51 PM Carlatta H wrote: Reason for CRM: Patient would like call back from the nurse//She has questions about labs

## 2023-06-03 NOTE — Telephone Encounter (Signed)
She can stop taking iron and focus on lifestyle modification such as consume iron rich foods such as Red meat, pork, poultry, seafood, beans, dark green leafy vegetables, such as spinach, dried fruit, such as raisins and apricots. Iron-fortified cereals, and peas.

## 2023-06-18 ENCOUNTER — Other Ambulatory Visit: Payer: Self-pay | Admitting: Family Medicine

## 2023-06-18 DIAGNOSIS — F339 Major depressive disorder, recurrent, unspecified: Secondary | ICD-10-CM

## 2023-07-02 ENCOUNTER — Encounter: Payer: Self-pay | Admitting: Internal Medicine

## 2023-07-02 ENCOUNTER — Ambulatory Visit: Payer: Self-pay | Admitting: Internal Medicine

## 2023-07-02 VITALS — BP 127/84 | HR 84 | Ht 60.0 in | Wt 140.8 lb

## 2023-07-02 DIAGNOSIS — I83813 Varicose veins of bilateral lower extremities with pain: Secondary | ICD-10-CM

## 2023-07-02 NOTE — Assessment & Plan Note (Addendum)
 Likely worse due to prolonged standing, avoid prolonged standing Advised to perform leg elevation and use compression socks as tolerated She does not have apparent leg swelling today, but her pain could be due to venous congestion Referred to vascular surgery

## 2023-07-02 NOTE — Progress Notes (Signed)
 Acute Office Visit  Subjective:    Patient ID: [name], female    DOB: 1975-09-28, 48 y.o.   MRN: [id number]  Chief Complaint  Patient presents with   Leg Pain    Pt reports sx of leg pain, varicose vein concerns, ongoing for about 6 months or longer.     HPI Patient is in today for complaint of leg swelling and bilateral calf area pain for the last 6 months.  She has noticed dilated veins on bilateral LE, which are chronic.  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  She has history of hip and knee pain as well, has been given naproxen in the past.  Denies any recent worsening of dyspnea or wheezing.  Denies orthopnea or PND currently.  Past Medical History:  Diagnosis Date   Anemia 05/31/2014   BV (bacterial vaginosis) 06/16/2015   Depression    Encounter for smoking cessation counseling 06/16/2015   HSV (herpes simplex virus) infection    Hypertension    Smoker 06/16/2015   Umbilical hernia 06/16/2015   Vaginal discharge 05/30/2014   Vitamin D deficiency 06/19/2015   Yeast infection 05/30/2014    Past Surgical History:  Procedure Laterality Date   CESAREAN SECTION     COLONOSCOPY WITH PROPOFOL N/A 07/04/2022   Procedure: COLONOSCOPY WITH PROPOFOL;  Surgeon: Lanelle Bal, DO;  Location: [facility];  Service: Endoscopy;  Laterality: N/A;  11;00 am, asa 2   POLYPECTOMY  07/04/2022   Procedure: POLYPECTOMY;  Surgeon: Lanelle Bal, DO;  Location: [facility];  Service: Endoscopy;;   TUBAL LIGATION      Family History  Problem Relation Age of Onset   Alzheimer's disease Maternal Grandmother    Cancer Maternal Grandfather    Kidney disease Father    Hypertension Mother    Hypertension Brother    Sickle cell anemia Brother    Other Brother        born with fluid on brain   Asthma Son     Social History   Socioeconomic History   Marital status: Legally Separated    Spouse name: Not on file   Number of children: 3   Years of  education: Not on file   Highest education level: 10th grade  Occupational History   Not on file  Tobacco Use   Smoking status: Every Day    Current packs/day: 0.50    Average packs/day: 0.5 packs/day for 18.0 years (9.0 ttl pk-yrs)    Types: Cigarettes   Smokeless tobacco: Never   Tobacco comments:    1/2 a day.  Vaping Use   Vaping status: Never Used  Substance and Sexual Activity   Alcohol use: No   Drug use: Not Currently    Types: Marijuana    Comment: none since ine her 74s   Sexual activity: Yes    Partners: Male    Birth control/protection: Surgical, I.U.D.    Comment: tubal  Other Topics Concern   Not on file  Social History Narrative   Works at [employer].    Social Drivers of Health   Financial Resource Strain: High Risk (05/28/2022)   Overall Financial Resource Strain (CARDIA)    Difficulty of Paying Living Expenses: Hard  Food Insecurity: Food Insecurity Present (05/28/2022)   Hunger Vital Sign    Worried About Running Out of Food in the Last Year: Sometimes true    Ran Out of Food in the Last  Year: Sometimes true  Transportation Needs: No Transportation Needs (09/28/2021)   PRAPARE - Administrator, Civil Service (Medical): No    Lack of Transportation (Non-Medical): No  Physical Activity: Inactive (05/28/2022)   Exercise Vital Sign    Days of Exercise per Week: 0 days    Minutes of Exercise per Session: 0 min  Stress: Stress Concern Present (05/28/2022)   [employer] of Occupational Health - Occupational Stress Questionnaire    Feeling of Stress : Rather much  Social Connections: Moderately Isolated (05/28/2022)   Social Connection and Isolation Panel [NHANES]    Frequency of Communication with Friends and Family: More than three times a week    Frequency of Social Gatherings with Friends and Family: Once a week    Attends Religious Services: 1 to 4 times per year    Active Member of [employer] or Organizations: No    Attends Banker  Meetings: Never    Marital Status: Separated  Intimate Partner Violence: Not At Risk (05/28/2022)   Humiliation, Afraid, Rape, and Kick questionnaire    Fear of Current or Ex-Partner: No    Emotionally Abused: No    Physically Abused: No    Sexually Abused: No    Outpatient Medications Prior to Visit  Medication Sig Dispense Refill   azelastine (ASTELIN) 0.1 % nasal spray Place 1 spray into both nostrils 2 (two) times daily. Use in each nostril as directed 30 mL 2   azithromycin (ZITHROMAX) 250 MG tablet Take 2 tablets on day 1, then 1 tablet daily on days 2 through 5 6 tablet 0   cetirizine (ZYRTEC) 10 MG tablet Take 10 mg by mouth daily.     cholecalciferol (VITAMIN D3) 25 MCG (1000 UNIT) tablet Take 2,000 Units by mouth daily.     ferrous sulfate 325 (65 FE) MG tablet Take 1 tablet (325 mg total) by mouth 2 (two) times daily with a meal. (Patient taking differently: Take 325 mg by mouth daily.) 60 tablet 3   fluconazole (DIFLUCAN) 150 MG tablet Take 1 now and 1 in 3 days if needed 2 tablet 1   fluticasone (FLONASE) 50 MCG/ACT nasal spray Place 1 spray into both nostrils daily.     levocetirizine (XYZAL) 5 MG tablet Take 1 tablet (5 mg total) by mouth every evening. 30 tablet 1   levonorgestrel (MIRENA) 20 MCG/DAY IUD 1 each by Intrauterine route once. (Inserted 2021)     Multiple Vitamins-Minerals (ONE-A-DAY WOMENS PO) Take 1 tablet by mouth daily.     naproxen (NAPROSYN) 375 MG tablet Take 1 tablet (375 mg total) by mouth 2 (two) times daily. 20 tablet 0   naproxen (NAPROSYN) 500 MG tablet Take 1 tablet (500 mg total) by mouth 2 (two) times daily as needed. 60 tablet 0   nicotine (NICODERM CQ - DOSED IN MG/24 HOURS) 14 mg/24hr patch Place 1 patch (14 mg total) onto the skin daily. 48 patch 0   ondansetron (ZOFRAN) 4 MG tablet Take 1 tablet (4 mg total) by mouth every 8 (eight) hours as needed for nausea or vomiting. 20 tablet 0   promethazine-dextromethorphan (PROMETHAZINE-DM) 6.25-15  MG/5ML syrup Take 5 mLs by mouth 4 (four) times daily as needed. 118 mL 0   sertraline (ZOLOFT) 25 MG tablet Take 1 tablet by mouth once daily 30 tablet 0   terbinafine (LAMISIL) 1 % cream Apply to the bottom and side of foot, the toe nails twice daily for 2 weeks 42 g 0  vitamin C (ASCORBIC ACID) 500 MG tablet Take 500 mg by mouth daily.     No facility-administered medications prior to visit.    Allergies  Allergen Reactions   Lisinopril Cough    Review of Systems  Constitutional:  Negative for chills and fever.  HENT:  Negative for congestion and sore throat.   Eyes:  Negative for pain and discharge.  Respiratory:  Negative for cough and shortness of breath.   Cardiovascular:  Positive for leg swelling. Negative for chest pain and palpitations.  Gastrointestinal:  Negative for nausea and vomiting.  Genitourinary:  Negative for dysuria and hematuria.  Musculoskeletal:  Positive for arthralgias and back pain. Negative for neck pain and neck stiffness.  Skin:  Negative for rash.  Neurological:  Negative for dizziness and weakness.  Psychiatric/Behavioral:  Negative for agitation and behavioral problems.        Objective:    Physical Exam Vitals reviewed.  Constitutional:      General: She is not in acute distress.    Appearance: She is not diaphoretic.  HENT:     Head: Normocephalic and atraumatic.     Nose: Nose normal.     Mouth/Throat:     Mouth: Mucous membranes are moist.  Eyes:     General: No scleral icterus.    Extraocular Movements: Extraocular movements intact.  Cardiovascular:     Rate and Rhythm: Normal rate and regular rhythm.     Pulses:          Dorsalis pedis pulses are 2+ on the right side and 2+ on the left side.     Heart sounds: Normal heart sounds. No murmur heard. Pulmonary:     Breath sounds: Normal breath sounds. No wheezing or rales.  Musculoskeletal:     Cervical back: Neck supple. No tenderness.     Right lower leg: No edema.     Left  lower leg: No edema.  Skin:    General: Skin is warm.     Findings: No rash.     Comments: Varicose veins bilaterally  Neurological:     General: No focal deficit present.     Mental Status: She is alert and oriented to person, place, and time.  Psychiatric:        Mood and Affect: Mood normal.        Behavior: Behavior normal.     BP 127/84   Pulse 84   Ht 5' (1.524 m)   Wt 140 lb 12.8 oz (63.9 kg)   SpO2 98%   BMI 27.50 kg/m  Wt Readings from Last 3 Encounters:  07/02/23 140 lb 12.8 oz (63.9 kg)  05/23/23 142 lb 0.6 oz (64.4 kg)  04/17/23 138 lb 1.3 oz (62.6 kg)        Assessment & Plan:   Problem List Items Addressed This Visit       Cardiovascular and Mediastinum   Varicose veins of both lower extremities with pain - Primary   Likely worse due to prolonged standing, avoid prolonged standing Advised to perform leg elevation and use compression socks as tolerated She does not have apparent leg swelling today, but her pain could be due to venous congestion Referred to vascular surgery      Relevant Orders   Ambulatory referral to Vascular Surgery     No orders of the defined types were placed in this encounter.    Anabel Halon, MD

## 2023-07-02 NOTE — Patient Instructions (Signed)
 Please perform leg elevation and use compression socks as tolerated.  You are being referred to Vascular surgery.

## 2023-07-16 ENCOUNTER — Other Ambulatory Visit: Payer: Self-pay | Admitting: Family Medicine

## 2023-07-16 DIAGNOSIS — F339 Major depressive disorder, recurrent, unspecified: Secondary | ICD-10-CM

## 2023-07-17 ENCOUNTER — Encounter: Payer: Self-pay | Admitting: Family Medicine

## 2023-07-17 DIAGNOSIS — Z1231 Encounter for screening mammogram for malignant neoplasm of breast: Secondary | ICD-10-CM

## 2023-07-18 ENCOUNTER — Encounter: Payer: Self-pay | Admitting: Advanced Practice Midwife

## 2023-07-18 ENCOUNTER — Ambulatory Visit: Admitting: Advanced Practice Midwife

## 2023-07-18 VITALS — BP 125/83 | HR 87 | Ht 60.0 in | Wt 138.4 lb

## 2023-07-18 DIAGNOSIS — Z30430 Encounter for insertion of intrauterine contraceptive device: Secondary | ICD-10-CM | POA: Diagnosis not present

## 2023-07-18 DIAGNOSIS — Z01419 Encounter for gynecological examination (general) (routine) without abnormal findings: Secondary | ICD-10-CM | POA: Diagnosis not present

## 2023-07-18 NOTE — Progress Notes (Signed)
 WELL-WOMAN EXAMINATION Patient name: [name] MRN [id number]  Date of birth: 1976/01/16 Chief Complaint:   Gynecologic Exam (Last pap 05-28-2022 normal)  History of Present Illness:   [name] is a 48 y.o. [phone] African-American female being seen today for a routine well-woman exam.  Current complaints: doing well; thought was due for Pap  PCP: Gilmore Laroche FNP      does not desire labs Patient's last menstrual period was 06/30/2023. The current method of family planning is IUD (inserted Feb 2021) Last pap Feb 2024 (next due Feb 2029). Results were: NILM w/ HRHPV negative. H/O abnormal pap: no Last mammogram: June 2024. Results were: normal. Family h/o breast cancer: no Last colonoscopy: 2024. Results were: abnormal benign polyps; f/u in 7 years . Family h/o colorectal cancer: no     07/18/2023    8:51 AM 07/02/2023    3:35 PM 04/17/2023   10:03 AM 03/05/2023   11:02 AM 11/01/2022   10:44 AM  Depression screen PHQ 2/9  Decreased Interest 0 0 0 0 0  Down, Depressed, Hopeless 0 0 0 0 0  PHQ - 2 Score 0 0 0 0 0  Altered sleeping 1 0 0 0 0  Tired, decreased energy 1 0 0 0 0  Change in appetite 0 0 0 0 0  Feeling bad or failure about yourself  0 0 0 0 0  Trouble concentrating 0 0 0 0 0  Moving slowly or fidgety/restless 0 0 0 0 0  Suicidal thoughts 0 0 0 0 0  PHQ-9 Score 2 0 0 0 0  Difficult doing work/chores  Not difficult at all Not difficult at all Not difficult at all Not difficult at all        07/18/2023    8:53 AM 07/02/2023    3:35 PM 04/17/2023   10:03 AM 03/05/2023   11:02 AM  GAD 7 : Generalized Anxiety Score  Nervous, Anxious, on Edge 0 0 0 0  Control/stop worrying 0 0 0 0  Worry too much - different things 0 0 0 0  Trouble relaxing 2 0 0 0  Restless 1 0 0 0  Easily annoyed or irritable 0 0 0 0  Afraid - awful might happen 0 0 0 0  Total GAD 7 Score 3 0 0 0  Anxiety Difficulty  Not difficult at all Not difficult at all Not difficult at all      Review of Systems:   Pertinent items are noted in HPI Denies any headaches, blurred vision, fatigue, shortness of breath, chest pain, abdominal pain, abnormal vaginal discharge/itching/odor/irritation, problems with periods, bowel movements, urination, or intercourse unless otherwise stated above. Pertinent History Reviewed:  Reviewed past medical,surgical, social and family history.  Reviewed problem list, medications and allergies. Physical Assessment:   Vitals:   07/18/23 0846  BP: 125/83  Pulse: 87  Weight: 138 lb 6.4 oz (62.8 kg)  Height: 5' (1.524 m)  Body mass index is 27.03 kg/m.        Physical Examination:   General appearance - well appearing, and in no distress  Mental status - alert, oriented to person, place, and time  Psych:  She has a normal mood and affect  Skin - warm and dry, normal color, no suspicious lesions noted  Chest - effort normal, all lung fields clear to auscultation bilaterally  Heart - normal rate and regular rhythm  Neck:  midline trachea, no thyromegaly or nodules  Breasts - breasts appear  normal, no suspicious masses, no skin or nipple changes or  axillary nodes  Abdomen - soft, nontender, nondistended, no masses or organomegaly  Pelvic - VULVA: normal appearing vulva with no masses, tenderness or lesions  VAGINA: normal appearing vagina with normal color and discharge, no lesions  CERVIX: normal appearing cervix without discharge or lesions, no CMT; IUD strings visualized  Thin prep pap is not done   UTERUS: uterus is felt to be normal size, shape, consistency and nontender   ADNEXA: No adnexal masses or tenderness noted.  Rectal - not examined  Extremities:  No swelling or varicosities noted  Chaperone: [name]  No results found for this or any previous visit (from the past 24 hours).  Assessment & Plan:  1) Well-Woman Exam, no pre-menopausal symptoms  2) Mirena in place, occ spotting, doing well, due to come out Feb  2029  Labs/procedures today: none  Mammogram:  June 2025 , or sooner if problems Colonoscopy:  2031 (per 70yr rec) , or sooner if problems  No orders of the defined types were placed in this encounter.   Meds: No orders of the defined types were placed in this encounter.   Follow-up: Return in about 1 year (around 07/17/2024) for Physical.  Arabella Merles [facility] 07/18/2023 9:24 AM

## 2023-07-29 ENCOUNTER — Other Ambulatory Visit: Payer: Self-pay | Admitting: Family Medicine

## 2023-07-29 DIAGNOSIS — Z1231 Encounter for screening mammogram for malignant neoplasm of breast: Secondary | ICD-10-CM

## 2023-08-13 ENCOUNTER — Other Ambulatory Visit: Payer: Self-pay | Admitting: Family Medicine

## 2023-08-13 DIAGNOSIS — F339 Major depressive disorder, recurrent, unspecified: Secondary | ICD-10-CM

## 2023-08-22 ENCOUNTER — Encounter: Payer: Self-pay | Admitting: Family Medicine

## 2023-08-22 ENCOUNTER — Other Ambulatory Visit: Payer: Self-pay | Admitting: Family Medicine

## 2023-08-22 DIAGNOSIS — D509 Iron deficiency anemia, unspecified: Secondary | ICD-10-CM

## 2023-08-22 NOTE — Telephone Encounter (Signed)
 Kindly inform the patient that she may return to reassess her iron levels during the week

## 2023-08-27 ENCOUNTER — Other Ambulatory Visit: Payer: Self-pay

## 2023-08-27 DIAGNOSIS — I872 Venous insufficiency (chronic) (peripheral): Secondary | ICD-10-CM

## 2023-08-28 ENCOUNTER — Ambulatory Visit

## 2023-08-28 VITALS — BP 120/75 | HR 77 | Ht 60.0 in | Wt 144.0 lb

## 2023-08-28 DIAGNOSIS — R7989 Other specified abnormal findings of blood chemistry: Secondary | ICD-10-CM | POA: Diagnosis not present

## 2023-08-28 NOTE — Progress Notes (Signed)
   Established Patient Office Visit  Subjective   Patient ID: [name], female    DOB: [birth year]  Age: 49 y.o. MRN: [id number]  Chief Complaint  Patient presents with   Medical Management of Chronic Issues    Pt here for iron reassestment    HPI    ROS    Objective:     BP 120/75   Pulse 77   Ht 5' (1.524 m)   Wt 144 lb (65.3 kg)   SpO2 98%   BMI 28.12 kg/m    Physical Exam Vitals and nursing note reviewed.  Constitutional:      Appearance: Normal appearance.  Eyes:     Extraocular Movements: Extraocular movements intact.     Pupils: Pupils are equal, round, and reactive to light.  Cardiovascular:     Rate and Rhythm: Normal rate and regular rhythm.  Pulmonary:     Effort: Pulmonary effort is normal.     Breath sounds: Normal breath sounds.  Neurological:     Mental Status: She is alert and oriented to person, place, and time.  Psychiatric:        Mood and Affect: Mood normal.        Thought Content: Thought content normal.        The 10-year ASCVD risk score (Arnett DK, et al., 2019) is: 1.2%    Assessment & Plan:   Problem List Items Addressed This Visit       Other   Elevated ferritin - Primary   Recheck labs.  She has stopped taking external iron supplementation since previous elevated ferritin level.        Relevant Orders   Fe+TIBC+Fer (Completed)   CBC with Differential/Platelet (Completed)    No follow-ups on file.    Alison Irvine, FNP

## 2023-08-29 ENCOUNTER — Telehealth: Payer: Self-pay | Admitting: Family Medicine

## 2023-08-29 DIAGNOSIS — R7989 Other specified abnormal findings of blood chemistry: Secondary | ICD-10-CM | POA: Insufficient documentation

## 2023-08-29 LAB — IRON,TIBC AND FERRITIN PANEL
Ferritin: 289 ng/mL — ABNORMAL HIGH (ref 15–150)
Iron Saturation: 24 % (ref 15–55)
Iron: 85 ug/dL (ref 27–159)
Total Iron Binding Capacity: 349 ug/dL (ref 250–450)
UIBC: 264 ug/dL (ref 131–425)

## 2023-08-29 LAB — CBC WITH DIFFERENTIAL/PLATELET
Basophils Absolute: 0 10*3/uL (ref 0.0–0.2)
Basos: 0 %
EOS (ABSOLUTE): 0 10*3/uL (ref 0.0–0.4)
Eos: 1 %
Hematocrit: 39.7 % (ref 34.0–46.6)
Hemoglobin: 12.4 g/dL (ref 11.1–15.9)
Immature Grans (Abs): 0 10*3/uL (ref 0.0–0.1)
Immature Granulocytes: 0 %
Lymphocytes Absolute: 2.3 10*3/uL (ref 0.7–3.1)
Lymphs: 46 %
MCH: 27.3 pg (ref 26.6–33.0)
MCHC: 31.2 g/dL — ABNORMAL LOW (ref 31.5–35.7)
MCV: 87 fL (ref 79–97)
Monocytes Absolute: 0.4 10*3/uL (ref 0.1–0.9)
Monocytes: 8 %
Neutrophils Absolute: 2.3 10*3/uL (ref 1.4–7.0)
Neutrophils: 45 %
Platelets: 183 10*3/uL (ref 150–450)
RBC: 4.54 x10E6/uL (ref 3.77–5.28)
RDW: 13.6 % (ref 11.7–15.4)
WBC: 5 10*3/uL (ref 3.4–10.8)

## 2023-08-29 NOTE — Telephone Encounter (Signed)
 Came into office to discuss lab results. Call patient at [phone].

## 2023-08-29 NOTE — Assessment & Plan Note (Signed)
 Recheck labs.  She has stopped taking external iron supplementation since previous elevated ferritin level.

## 2023-08-29 NOTE — Telephone Encounter (Signed)
 She will be contacted when labs are reviewed and interpreted by provider

## 2023-09-01 ENCOUNTER — Telehealth: Payer: Self-pay

## 2023-09-01 NOTE — Telephone Encounter (Signed)
 Patient advised we will call her once labs reviewed by provider

## 2023-09-01 NOTE — Telephone Encounter (Signed)
 Copied from CRM [id number]. Topic: Clinical - Lab/Test Results >> Aug 29, 2023  2:42 PM Sophia H wrote: Reason for CRM: Pt calling in regarding labs that were done, please reach out # [phone]

## 2023-09-03 ENCOUNTER — Telehealth: Payer: Self-pay

## 2023-09-03 NOTE — Telephone Encounter (Signed)
 Patient will be contacted once labs are reviewed, patient advised

## 2023-09-03 NOTE — Telephone Encounter (Signed)
 Copied from CRM [id number]. Topic: Clinical - Lab/Test Results >> Aug 29, 2023  2:42 PM Sophia H wrote: Reason for CRM: Pt calling in regarding labs that were done, please reach out # [phone]

## 2023-09-04 ENCOUNTER — Ambulatory Visit (HOSPITAL_COMMUNITY)
Admission: RE | Admit: 2023-09-04 | Discharge: 2023-09-04 | Disposition: A | Discharge status: Home / Self Care | Source: Ambulatory Visit | Attending: Vascular Surgery | Admitting: Vascular Surgery

## 2023-09-04 ENCOUNTER — Ambulatory Visit: Attending: Vascular Surgery | Admitting: Vascular Surgery

## 2023-09-04 ENCOUNTER — Encounter: Payer: Self-pay | Admitting: Vascular Surgery

## 2023-09-04 VITALS — BP 109/76 | HR 61 | Temp 98.1°F | Resp 16 | Wt 140.9 lb

## 2023-09-04 DIAGNOSIS — I872 Venous insufficiency (chronic) (peripheral): Secondary | ICD-10-CM | POA: Insufficient documentation

## 2023-09-04 DIAGNOSIS — I781 Nevus, non-neoplastic: Secondary | ICD-10-CM | POA: Diagnosis not present

## 2023-09-04 NOTE — Progress Notes (Signed)
 Office Note     CC: Bilateral lower extremity varicose veins Requesting Provider:  Meldon Sport, MD  HPI: [name] is a 48 y.o. (02/25/1976) female who presents at the request of Zarwolo, Gloria, FNP for evaluation of bilateral lower extremity varicose veins.  On exam, [name] was doing well.  A native of [place], she now resides in [id number].  She is a Lawyer by trade, and works third shift.  Over the last several years, she has appreciated varicose veins on bilateral lower extremities.  Most recently, she has appreciated pain which radiates from her back into her right hip.  She is wondering if the 2 diagnoses are related.  She denies significant edema, achy, heavy feeling by days end.  She denies swelling, bleeding, ulceration of the varicosities.  The hip pain she describes as most prevalent in the morning after sleeping on her right side.  She denies symptoms of claudication, ischemic rest pain, tissue loss.  She has not worn compression stockings.  No previous venous procedures, no history of DVT   Past Medical History:  Diagnosis Date   Anemia 05/31/2014   BV (bacterial vaginosis) 06/16/2015   Depression    Encounter for smoking cessation counseling 06/16/2015   HSV (herpes simplex virus) infection    Hypertension    Smoker 06/16/2015   Umbilical hernia 06/16/2015   Vaginal discharge 05/30/2014   Vitamin D  deficiency 06/19/2015   Yeast infection 05/30/2014    Past Surgical History:  Procedure Laterality Date   CESAREAN SECTION     COLONOSCOPY WITH PROPOFOL  N/A 07/04/2022   Procedure: COLONOSCOPY WITH PROPOFOL ;  Surgeon: Vinetta Greening, DO;  Location: [facility];  Service: Endoscopy;  Laterality: N/A;  11;00 am, asa 2   POLYPECTOMY  07/04/2022   Procedure: POLYPECTOMY;  Surgeon: Vinetta Greening, DO;  Location: [facility];  Service: Endoscopy;;   TUBAL LIGATION      Social History   Socioeconomic History   Marital status: Legally Separated    Spouse  name: Not on file   Number of children: 3   Years of education: Not on file   Highest education level: 10th grade  Occupational History   Not on file  Tobacco Use   Smoking status: Former    Current packs/day: 0.50    Average packs/day: 0.5 packs/day for 18.0 years (9.0 ttl pk-yrs)    Types: Cigarettes    Passive exposure: Past   Smokeless tobacco: Never   Tobacco comments:    1/2 a day.  Vaping Use   Vaping status: Never Used  Substance and Sexual Activity   Alcohol use: No   Drug use: Not Currently    Types: Marijuana    Comment: none since ine her 4s   Sexual activity: Yes    Partners: Male    Birth control/protection: Surgical, I.U.D.    Comment: tubal  Other Topics Concern   Not on file  Social History Narrative   Works at [employer].    Social Drivers of Corporate investment banker Strain: Low Risk  (07/18/2023)   Overall Financial Resource Strain (CARDIA)    Difficulty of Paying Living Expenses: Not very hard  Food Insecurity: No Food Insecurity (07/18/2023)   Hunger Vital Sign    Worried About Running Out of Food in the Last Year: Never true    Ran Out of Food in the Last Year: Never true  Transportation Needs: No Transportation Needs (07/18/2023)   PRAPARE - Transportation  Lack of Transportation (Medical): No    Lack of Transportation (Non-Medical): No  Physical Activity: Insufficiently Active (07/18/2023)   Exercise Vital Sign    Days of Exercise per Week: 3 days    Minutes of Exercise per Session: 30 min  Stress: Stress Concern Present (07/18/2023)   [employer] of Occupational Health - Occupational Stress Questionnaire    Feeling of Stress : Rather much  Social Connections: Socially Integrated (07/18/2023)   Social Connection and Isolation Panel [NHANES]    Frequency of Communication with Friends and Family: More than three times a week    Frequency of Social Gatherings with Friends and Family: Once a week    Attends Religious Services: More than 4  times per year    Active Member of [employer] or Organizations: No    Attends Engineer, structural: More than 4 times per year    Marital Status: Married  Catering manager Violence: Not At Risk (07/18/2023)   Humiliation, Afraid, Rape, and Kick questionnaire    Fear of Current or Ex-Partner: No    Emotionally Abused: No    Physically Abused: No    Sexually Abused: No   Family History  Problem Relation Age of Onset   Alzheimer's disease Maternal Grandmother    Cancer Maternal Grandfather    Kidney disease Father    Hypertension Mother    Hypertension Brother    Sickle cell anemia Brother    Other Brother        born with fluid on brain   Asthma Son     Current Outpatient Medications  Medication Sig Dispense Refill   azelastine  (ASTELIN ) 0.1 % nasal spray Place 1 spray into both nostrils 2 (two) times daily. Use in each nostril as directed 30 mL 2   cetirizine (ZYRTEC) 10 MG tablet Take 10 mg by mouth daily.     fluticasone (FLONASE) 50 MCG/ACT nasal spray Place 1 spray into both nostrils daily.     levonorgestrel  (MIRENA ) 20 MCG/DAY IUD 1 each by Intrauterine route once. (Inserted 2021)     naproxen  (NAPROSYN ) 500 MG tablet Take 1 tablet (500 mg total) by mouth 2 (two) times daily as needed. 60 tablet 0   sertraline  (ZOLOFT ) 25 MG tablet Take 1 tablet by mouth once daily 30 tablet 0   No current facility-administered medications for this visit.    Allergies  Allergen Reactions   Lisinopril Cough     REVIEW OF SYSTEMS:   [X]  denotes positive finding, [ ]  denotes negative finding Cardiac  Comments:  Chest pain or chest pressure:    Shortness of breath upon exertion:    Short of breath when lying flat:    Irregular heart rhythm:        Vascular    Pain in calf, thigh, or hip brought on by ambulation:    Pain in feet at night that wakes you up from your sleep:     Blood clot in your veins:    Leg swelling:         Pulmonary    Oxygen at home:    Productive  cough:     Wheezing:         Neurologic    Sudden weakness in arms or legs:     Sudden numbness in arms or legs:     Sudden onset of difficulty speaking or slurred speech:    Temporary loss of vision in one eye:     Problems with dizziness:  Gastrointestinal    Blood in stool:     Vomited blood:         Genitourinary    Burning when urinating:     Blood in urine:        Psychiatric    Major depression:         Hematologic    Bleeding problems:    Problems with blood clotting too easily:        Skin    Rashes or ulcers:        Constitutional    Fever or chills:      PHYSICAL EXAMINATION:  Vitals:   09/04/23 1321  BP: 109/76  Pulse: 61  Resp: 16  Temp: 98.1 F (36.7 C)  TempSrc: Temporal  SpO2: 100%  Weight: 140 lb 14.4 oz (63.9 kg)    General:  WDWN in NAD; vital signs documented above Gait: Not observed HENT: WNL, normocephalic Pulmonary: normal non-labored breathing , without Rales, rhonchi,  wheezing Cardiac: regular HR Abdomen: soft, NT, no masses Skin: without rashes Vascular Exam/Pulses:  Right Left  Radial 2+ (normal) 2+ (normal)  Ulnar    Femoral    Popliteal    DP 2+ (normal) 2+ (normal)  PT     Extremities: without ischemic changes, without Gangrene , without cellulitis; without open wounds;  No significant varicosities appreciated.  Significant telangiectasias involving bilateral calves and thighs.  No swelling. Musculoskeletal: no muscle wasting or atrophy  Neurologic: A&O X 3;  No focal weakness or paresthesias are detected Psychiatric:  The pt has Normal affect.   Non-Invasive Vascular Imaging:   Venous Reflux Times  +--------------+---------+------+-----------+------------+--------+  RIGHT        Reflux NoRefluxReflux TimeDiameter cmsComments                          Yes                                   +--------------+---------+------+-----------+------------+--------+  CFV                    yes                                    +--------------+---------+------+-----------+------------+--------+  FV mid        no                                              +--------------+---------+------+-----------+------------+--------+  Popliteal    no                                              +--------------+---------+------+-----------+------------+--------+  GSV at SFJ              yes                 .56               +--------------+---------+------+-----------+------------+--------+  GSV prox thighno                            .  35               +--------------+---------+------+-----------+------------+--------+  GSV mid thigh no                            .25               +--------------+---------+------+-----------+------------+--------+  GSV dist thighno                            .35               +--------------+---------+------+-----------+------------+--------+  GSV at knee   no                            .26               +--------------+---------+------+-----------+------------+--------+  GSV prox calf no                            .30               +--------------+---------+------+-----------+------------+--------+  SSV Pop Fossa no                            .36               +--------------+---------+------+-----------+------------+--------+  SSV prox calf no                            .19               +--------------+---------+------+-----------+------------+--------+         Summary:  Right:  - Venous reflux is noted in the right common femoral vein.  - Venous reflux is noted in the right sapheno-femoral junction.    *See table(s) above for measurements and observations.      ASSESSMENT/PLAN:: 48 y.o. female presenting with bilateral lower extremity telangiectasias.  No significant edema.  No varicosities appreciated.  I had a long discussion with [name] regarding her back and hip symptoms.   The pain she describes is musculoskeletal. On physical exam, she had palpable pulses in the feet.  No significant edema, telangiectasias bilaterally. Ultrasound study demonstrated focal reflux at the common femoral vein and saphenofemoral junction, otherwise the greater saphenous vein and deep system were competent.  [name] would not have significant benefit with greater saphenous vein ablation.  She is asymptomatic, and she has no reflux throughout the greater saphenous vein, except for at the junction.  Regarding the telangiectasias appreciated bilaterally, these can be treated with sclerotherapy which is performed in house by one of our nurses.  Unfortunately, this is not covered by insurance.  [name] would best served with orthopedic evaluation of the lumbar spine and right hip.  She can follow-up with me as needed.  Should she choose to pursue sclerotherapy, she can call our office for scheduling.   Kayla Part, MD [facility] [phone]

## 2023-09-08 NOTE — Telephone Encounter (Signed)
 Patient came back by the office today asking for provider to contact her about her lab results and also she has question on taking a multi vitamin supplement with no iron.

## 2023-09-08 NOTE — Telephone Encounter (Signed)
 Pt stopped by office to discuss lab results. It looks like GYN already reviewed labs and reached out to pt but pt is still asking for your review. Please review labs

## 2023-09-08 NOTE — Telephone Encounter (Signed)
 See other note

## 2023-09-09 ENCOUNTER — Other Ambulatory Visit: Payer: Self-pay

## 2023-09-09 ENCOUNTER — Ambulatory Visit: Payer: Self-pay

## 2023-09-09 ENCOUNTER — Other Ambulatory Visit: Payer: Self-pay | Admitting: Family Medicine

## 2023-09-09 DIAGNOSIS — R7989 Other specified abnormal findings of blood chemistry: Secondary | ICD-10-CM

## 2023-09-09 DIAGNOSIS — F339 Major depressive disorder, recurrent, unspecified: Secondary | ICD-10-CM

## 2023-09-09 NOTE — Telephone Encounter (Signed)
 Pt called in today about her lab results. Pt had labs performed on 5/8 and has not heard back from the office. Pt has seen the results in MyChart. There is no note from the provider. Pt concerned about her ferritin level of 289 and her MCHC of 31.2. Pt recently purchased iron supplements and does not know if she is advised to take them or not.  RN advised pt RN would relay question to the office for follow-up. Pt would appreciate a call from the office.   Copied from CRM [id number]. Topic: Clinical - Lab/Test Results >> Sep 09, 2023 12:10 PM Alpha [employer] wrote: Reason for CRM: Patient has further questions about her labs Reason for Disposition  Health Information question, no triage required and triager able to answer question  Answer Assessment - Initial Assessment Questions 1. REASON FOR CALL or QUESTION: "What is your reason for calling today?" or "How can I best help you?" or "What question do you have that I can help answer?"     Calling about lab results.  Protocols used: Information Only Call - No Triage-A-[facility]

## 2023-09-18 ENCOUNTER — Encounter: Payer: Self-pay | Admitting: Hematology

## 2023-09-18 ENCOUNTER — Inpatient Hospital Stay: Attending: Hematology | Admitting: Hematology

## 2023-09-18 ENCOUNTER — Inpatient Hospital Stay

## 2023-09-18 VITALS — BP 131/79 | HR 82 | Temp 97.0°F | Resp 18 | Ht 60.0 in | Wt 139.0 lb

## 2023-09-18 DIAGNOSIS — N920 Excessive and frequent menstruation with regular cycle: Secondary | ICD-10-CM | POA: Diagnosis not present

## 2023-09-18 DIAGNOSIS — Z8042 Family history of malignant neoplasm of prostate: Secondary | ICD-10-CM | POA: Insufficient documentation

## 2023-09-18 DIAGNOSIS — D573 Sickle-cell trait: Secondary | ICD-10-CM | POA: Insufficient documentation

## 2023-09-18 DIAGNOSIS — R7989 Other specified abnormal findings of blood chemistry: Secondary | ICD-10-CM | POA: Insufficient documentation

## 2023-09-18 DIAGNOSIS — I10 Essential (primary) hypertension: Secondary | ICD-10-CM | POA: Insufficient documentation

## 2023-09-18 DIAGNOSIS — Z801 Family history of malignant neoplasm of trachea, bronchus and lung: Secondary | ICD-10-CM | POA: Insufficient documentation

## 2023-09-18 DIAGNOSIS — E559 Vitamin D deficiency, unspecified: Secondary | ICD-10-CM | POA: Insufficient documentation

## 2023-09-18 DIAGNOSIS — F17210 Nicotine dependence, cigarettes, uncomplicated: Secondary | ICD-10-CM | POA: Insufficient documentation

## 2023-09-18 DIAGNOSIS — Z87891 Personal history of nicotine dependence: Secondary | ICD-10-CM | POA: Diagnosis not present

## 2023-09-18 DIAGNOSIS — I739 Peripheral vascular disease, unspecified: Secondary | ICD-10-CM | POA: Insufficient documentation

## 2023-09-18 DIAGNOSIS — Z793 Long term (current) use of hormonal contraceptives: Secondary | ICD-10-CM | POA: Diagnosis not present

## 2023-09-18 DIAGNOSIS — Z79899 Other long term (current) drug therapy: Secondary | ICD-10-CM | POA: Insufficient documentation

## 2023-09-18 NOTE — Patient Instructions (Addendum)
 You were seen and examined today by Dr. Cheree Cords. Dr. Cheree Cords is a hematologist, meaning that he specializes in blood abnormalities. Dr. Cheree Cords discussed your past medical history, family history of cancers/blood conditions and the events that led to you being here today.  You were referred to Dr. Cheree Cords due to elevated ferritin.  Continue to hold the iron pill.  We will see you back in 2 months. We will repeat your iron levels prior to this visit.   Follow-up as scheduled.

## 2023-09-18 NOTE — Progress Notes (Signed)
 [name] [address], [place] [address]   Clinic Day:  09/18/2023  Referring physician: Alison Irvine, FNP  Patient Care Team: Zarwolo, Gloria, FNP as PCP - General (Family Medicine)   ASSESSMENT & PLAN:   Assessment:  1.  Mildly elevated ferritin levels: - Seen at the request of Alison Irvine, FNP - 05/27/2023: Ferritin 305, saturation 35 - 08/28/2023: Ferritin 289, saturation 24 - She is taking iron tablet and vitamin C daily for the last 4 years.  Stopped taking iron for few days after the labs on 05/27/2023, took 31 pills prior to repeat labs on 08/28/2023 and then stopped completely.  No prior history of blood transfusion. - Had history of heavy menses in 2021 from fibroids, status post Mirena  with improvement.  She now has spotting/mild bleeding monthly.  2. Social/Family History: -Quit tobacco use 1 month ago of 0.5 ppd since age 33.  -No family history of hemochromatosis. Mother experienced menopause at age 18. Brother has sickle cell anemia. Maternal aunt has thyroid cancer. Maternal uncle has prostate cancer. Maternal grandfather died from lung cancer. Maternal cousin died from lymphoma.   Plan:  1.  Mildly elevated ferritin levels: - She has mildly elevated ferritin levels, corresponding to iron and vitamin C intake and decrease in menstrual bleeding.  Her saturations are normal.  I do not suspect hereditary hemochromatosis.  Hence I am not doing any testing today. - She has been holding iron tablet and vitamin C completely since 08/28/2023.  She will continue to hold it.  RTC 2 months for follow-up with repeat CBC, ferritin and iron panel.  If the ferritin improved to normal levels, she does not require any further testing.  She will be discharged from the clinic at that point.   Orders Placed This Encounter  Procedures   CBC    Standing Status:   Future    Expected Date:   11/10/2023    Expiration Date:   09/17/2024   Iron and TIBC ([facility] ONLY)    Standing Status:   Future    Expected Date:   11/10/2023    Expiration Date:   09/17/2024   Ferritin    Standing Status:   Future    Expected Date:   11/10/2023    Expiration Date:   09/17/2024      Hurman Maiden R Teague,acting as a scribe for Paulett Boros, MD.,have documented all relevant documentation on the behalf of Paulett Boros, MD,as directed by  Paulett Boros, MD while in the presence of Paulett Boros, MD.   I, Paulett Boros MD, have reviewed the above documentation for accuracy and completeness, and I agree with the above.   Paulett Boros, MD   5/29/20252:06 PM  CHIEF COMPLAINT/PURPOSE OF CONSULT:   Diagnosis: Mildly elevated ferritin levels  Current Therapy: None  HISTORY OF PRESENT ILLNESS:   [name] is a 48 y.o. female presenting to clinic today for evaluation of elevated ferritin at the request of Alison Irvine, FNP.  Patient has a medical history of Vitamin D  deficiency, peripheral venous insufficiency and varicose veins requiring LE venous reflux, intramural uterine fibroid, umbilical hernia, and herpes simplex type II infection.   Kemora was seen by her PCP on 08/28/23 for management of chronic issues with lab work done prior. She has a history of iron deficiency anemia and was taking oral iron supplements until she was told to discontinue after ferritin was elevated in February 2025. Her ferritin has remained elevated since 05/27/23,  at which time it was 304. Vitamin B 12 from 05/27/23 was normal at 635. Her most recent ferritin from 08/28/23 was elevated at 289. CBC diff from 08/28/23 showed low MCHC at 31.2  Her most recent colonoscopy was on 07/04/22 with Dr. Mordechai April. Two 3-4 mm polyps were found in the ascending colon.  Today, she states that she is doing well overall. Her appetite level is at 100%. Her energy level is at 75%.  She discontinued iron and vitamin C supplements in February 2025.Susane has taken iron  supplements and vitamin C daily for 4 years due to an episode of menorrhagia in 2021. She was diagnosed with uterine fibroid around that time and had an IUD placed. She also notes only having one menstrual cycle this year, which has been regular in the past. Other than one episode of menorrhagia, she has light menstrual cycles or spotting. She has no history of blood transfusions. RH negative blood  Quenisha has also discontinued Women's One a Day in May 2025, which contained some iron, though she was not taking it regularly from February 2025 to 08/28/2023. She states she took 31 pills of this during that time. Chamille states she has sickle cell trait, though she has not been checked for this.   PAST MEDICAL HISTORY:   Past Medical History: Past Medical History:  Diagnosis Date   Anemia 05/31/2014   BV (bacterial vaginosis) 06/16/2015   Depression    Encounter for smoking cessation counseling 06/16/2015   HSV (herpes simplex virus) infection    Hypertension    Smoker 06/16/2015   Umbilical hernia 06/16/2015   Vaginal discharge 05/30/2014   Vitamin D  deficiency 06/19/2015   Yeast infection 05/30/2014    Surgical History: Past Surgical History:  Procedure Laterality Date   CESAREAN SECTION     COLONOSCOPY WITH PROPOFOL  N/A 07/04/2022   Procedure: COLONOSCOPY WITH PROPOFOL ;  Surgeon: Vinetta Greening, DO;  [facility];  Service: Endoscopy;  Laterality: N/A;  11;00 am, asa 2   POLYPECTOMY  07/04/2022   Procedure: POLYPECTOMY;  Surgeon: Vinetta Greening, DO;  Location: [facility];  Service: Endoscopy;;   TUBAL LIGATION      Social History: Social History   Socioeconomic History   Marital status: Legally Separated    Spouse name: Not on file   Number of children: 3   Years of education: Not on file   Highest education level: 10th grade  Occupational History   Not on file  Tobacco Use   Smoking status: Former    Current packs/day: 0.50    Average packs/day: 0.5  packs/day for 18.0 years (9.0 ttl pk-yrs)    Types: Cigarettes    Passive exposure: Past   Smokeless tobacco: Never   Tobacco comments:    1/2 a day.  Vaping Use   Vaping status: Never Used  Substance and Sexual Activity   Alcohol use: No   Drug use: Not Currently    Types: Marijuana    Comment: none since ine her 53s   Sexual activity: Yes    Partners: Male    Birth control/protection: Surgical, I.U.D.    Comment: tubal  Other Topics Concern   Not on file  Social History Narrative   Works at [employer].    Social Drivers of Corporate investment banker Strain: Low Risk  (07/18/2023)   Overall Financial Resource Strain (CARDIA)    Difficulty of Paying Living Expenses: Not very hard  Food Insecurity: No Food Insecurity (  09/18/2023)   Hunger Vital Sign    Worried About Running Out of Food in the Last Year: Never true    Ran Out of Food in the Last Year: Never true  Transportation Needs: No Transportation Needs (09/18/2023)   PRAPARE - Administrator, Civil Service (Medical): No    Lack of Transportation (Non-Medical): No  Physical Activity: Insufficiently Active (07/18/2023)   Exercise Vital Sign    Days of Exercise per Week: 3 days    Minutes of Exercise per Session: 30 min  Stress: Stress Concern Present (07/18/2023)   [employer] of Occupational Health - Occupational Stress Questionnaire    Feeling of Stress : Rather much  Social Connections: Socially Integrated (07/18/2023)   Social Connection and Isolation Panel [NHANES]    Frequency of Communication with Friends and Family: More than three times a week    Frequency of Social Gatherings with Friends and Family: Once a week    Attends Religious Services: More than 4 times per year    Active Member of [employer] or Organizations: No    Attends Engineer, structural: More than 4 times per year    Marital Status: Married  Catering manager Violence: Not At Risk (09/18/2023)   Humiliation, Afraid, Rape, and Kick  questionnaire    Fear of Current or Ex-Partner: No    Emotionally Abused: No    Physically Abused: No    Sexually Abused: No    Family History: Family History  Problem Relation Age of Onset   Alzheimer's disease Maternal Grandmother    Cancer Maternal Grandfather    Kidney disease Father    Hypertension Mother    Hypertension Brother    Sickle cell anemia Brother    Other Brother        born with fluid on brain   Asthma Son     Current Medications:  Current Outpatient Medications:    azelastine  (ASTELIN ) 0.1 % nasal spray, Place 1 spray into both nostrils 2 (two) times daily. Use in each nostril as directed, Disp: 30 mL, Rfl: 2   cetirizine (ZYRTEC) 10 MG tablet, Take 10 mg by mouth daily., Disp: , Rfl:    fluticasone (FLONASE) 50 MCG/ACT nasal spray, Place 1 spray into both nostrils daily., Disp: , Rfl:    levonorgestrel  (MIRENA ) 20 MCG/DAY IUD, 1 each by Intrauterine route once. (Inserted 2021), Disp: , Rfl:    naproxen  (NAPROSYN ) 500 MG tablet, Take 1 tablet (500 mg total) by mouth 2 (two) times daily as needed., Disp: 60 tablet, Rfl: 0   sertraline  (ZOLOFT ) 25 MG tablet, Take 1 tablet by mouth once daily, Disp: 30 tablet, Rfl: 0   Allergies: Allergies  Allergen Reactions   Lisinopril Cough    REVIEW OF SYSTEMS:   Review of Systems  Constitutional:  Negative for chills, fatigue and fever.  HENT:   Negative for lump/mass, mouth sores, nosebleeds, sore throat and trouble swallowing.   Eyes:  Negative for eye problems.  Respiratory:  Negative for cough and shortness of breath.   Cardiovascular:  Negative for chest pain, leg swelling and palpitations.  Gastrointestinal:  Negative for abdominal pain, constipation, diarrhea, nausea and vomiting.  Genitourinary:  Negative for bladder incontinence, difficulty urinating, dysuria, frequency, hematuria and nocturia.   Musculoskeletal:  Positive for arthralgias (in hips, thighs, and shoulders). Negative for back pain, flank  pain, myalgias and neck pain.  Skin:  Negative for itching and rash.  Neurological:  Negative for dizziness, headaches and numbness.  Hematological:  Does not bruise/bleed easily.  Psychiatric/Behavioral:  Negative for depression, sleep disturbance and suicidal ideas. The patient is not nervous/anxious.   All other systems reviewed and are negative.    VITALS:   Blood pressure 131/79, pulse 82, temperature (!) 97 F (36.1 C), temperature source Tympanic, resp. rate 18, height 5' (1.524 m), weight 139 lb (63 kg), SpO2 100%.  Wt Readings from Last 3 Encounters:  09/18/23 139 lb (63 kg)  09/04/23 140 lb 14.4 oz (63.9 kg)  08/28/23 144 lb (65.3 kg)    Body mass index is 27.15 kg/m.   PHYSICAL EXAM:   Physical Exam Vitals and nursing note reviewed. Exam conducted with a chaperone present.  Constitutional:      Appearance: Normal appearance.  Cardiovascular:     Rate and Rhythm: Normal rate and regular rhythm.     Pulses: Normal pulses.     Heart sounds: Normal heart sounds.  Pulmonary:     Effort: Pulmonary effort is normal.     Breath sounds: Normal breath sounds.  Abdominal:     Palpations: Abdomen is soft. There is no hepatomegaly, splenomegaly or mass.     Tenderness: There is no abdominal tenderness.  Musculoskeletal:     Right lower leg: No edema.     Left lower leg: No edema.  Lymphadenopathy:     Cervical: No cervical adenopathy.     Right cervical: No superficial, deep or posterior cervical adenopathy.    Left cervical: No superficial, deep or posterior cervical adenopathy.     Upper Body:     Right upper body: No supraclavicular or axillary adenopathy.     Left upper body: No supraclavicular or axillary adenopathy.  Neurological:     General: No focal deficit present.     Mental Status: She is alert and oriented to person, place, and time.  Psychiatric:        Mood and Affect: Mood normal.        Behavior: Behavior normal.     LABS:   CBC    Component  Value Date/Time   WBC 5.0 08/28/2023 1002   WBC 5.9 10/02/2019 1614   RBC 4.54 08/28/2023 1002   RBC 4.75 10/02/2019 1614   HGB 12.4 08/28/2023 1002   HCT 39.7 08/28/2023 1002   PLT 183 08/28/2023 1002   MCV 87 08/28/2023 1002   MCH 27.3 08/28/2023 1002   MCH 27.4 10/02/2019 1614   MCHC 31.2 (L) 08/28/2023 1002   MCHC 31.3 10/02/2019 1614   RDW 13.6 08/28/2023 1002   LYMPHSABS 2.3 08/28/2023 1002   MONOABS 0.6 12/10/2009 1040   EOSABS 0.0 08/28/2023 1002   BASOSABS 0.0 08/28/2023 1002    CMP    Component Value Date/Time   NA 140 05/27/2023 0855   K 4.4 05/27/2023 0855   CL 105 05/27/2023 0855   CO2 24 05/27/2023 0855   GLUCOSE 89 05/27/2023 0855   GLUCOSE 91 10/02/2019 1614   BUN 14 05/27/2023 0855   CREATININE 0.70 05/27/2023 0855   CREATININE 0.63 06/13/2017 0909   CALCIUM 9.3 05/27/2023 0855   PROT 6.6 11/04/2022 0908   ALBUMIN 4.1 11/04/2022 0908   AST 18 11/04/2022 0908   ALT 13 11/04/2022 0908   ALKPHOS 55 11/04/2022 0908   BILITOT <0.2 11/04/2022 0908   GFRNONAA >60 10/02/2019 1614   GFRNONAA 112 06/13/2017 0909   GFRAA >60 10/02/2019 1614   GFRAA 130 06/13/2017 0909    No results found for: "CEA1", "CEA" /  No results found for: "CEA1", "CEA" No results found for: "PSA1" No results found for: "YNW295" No results found for: "CAN125"  No results found for: "TOTALPROTELP", "ALBUMINELP", "A1GS", "A2GS", "BETS", "BETA2SER", "GAMS", "MSPIKE", "SPEI" Lab Results  Component Value Date   TIBC 349 08/28/2023   TIBC 327 05/27/2023   TIBC 415 12/09/2017   FERRITIN 289 (H) 08/28/2023   FERRITIN 304 (H) 05/27/2023   FERRITIN 22 12/09/2017   IRONPCTSAT 24 08/28/2023   IRONPCTSAT 35 05/27/2023   IRONPCTSAT 19 12/09/2017   No results found for: "LDH"   STUDIES:   VAS US  LOWER EXTREMITY VENOUS REFLUX Result Date: 09/04/2023  Lower Venous Reflux Study Patient Name:  ROSABELL GEYER  Date of Exam:   09/04/2023 Medical Rec #: [id number]     Accession #:    [id number]  Date of Birth: 05-21-1975    Patient Gender: F Patient Age:   60 years Exam Location:  [address] Procedure:      VAS US  LOWER EXTREMITY VENOUS REFLUX Referring Phys: JOSHUA ROBINS --------------------------------------------------------------------------------  Indications: Pain.  Performing Technologist: Aloma Arrant RVS  Examination Guidelines: A complete evaluation includes B-mode imaging, spectral Doppler, color Doppler, and power Doppler as needed of all accessible portions of each vessel. Bilateral testing is considered an integral part of a complete examination. Limited examinations for reoccurring indications may be performed as noted. The reflux portion of the exam is performed with the patient in reverse Trendelenburg. Significant venous reflux is defined as >500 ms in the superficial venous system, and >1 second in the deep venous system.  Venous Reflux Times +--------------+---------+------+-----------+------------+--------+ RIGHT         Reflux NoRefluxReflux TimeDiameter cmsComments                         Yes                                  +--------------+---------+------+-----------+------------+--------+ CFV                     yes                                  +--------------+---------+------+-----------+------------+--------+ FV mid        no                                             +--------------+---------+------+-----------+------------+--------+ Popliteal     no                                             +--------------+---------+------+-----------+------------+--------+ GSV at SFJ              yes                 .56              +--------------+---------+------+-----------+------------+--------+ GSV prox thighno                            .35              +--------------+---------+------+-----------+------------+--------+  GSV mid thigh no                            .25               +--------------+---------+------+-----------+------------+--------+ GSV dist thighno                            .35              +--------------+---------+------+-----------+------------+--------+ GSV at knee   no                            .26              +--------------+---------+------+-----------+------------+--------+ GSV prox calf no                            .30              +--------------+---------+------+-----------+------------+--------+ SSV Pop Fossa no                            .36              +--------------+---------+------+-----------+------------+--------+ SSV prox calf no                            .19              +--------------+---------+------+-----------+------------+--------+   Summary: Right: - Venous reflux is noted in the right common femoral vein. - Venous reflux is noted in the right sapheno-femoral junction.  *See table(s) above for measurements and observations. Electronically signed by Irvin Mantel on 09/04/2023 at 1:26:25 PM.    Final

## 2023-10-07 ENCOUNTER — Ambulatory Visit
Admission: RE | Admit: 2023-10-07 | Discharge: 2023-10-07 | Disposition: A | Discharge status: Home / Self Care | Source: Ambulatory Visit | Attending: Family Medicine | Admitting: Family Medicine

## 2023-10-07 DIAGNOSIS — Z1231 Encounter for screening mammogram for malignant neoplasm of breast: Secondary | ICD-10-CM | POA: Insufficient documentation

## 2023-10-26 ENCOUNTER — Other Ambulatory Visit: Payer: Self-pay | Admitting: Family Medicine

## 2023-10-26 DIAGNOSIS — F339 Major depressive disorder, recurrent, unspecified: Secondary | ICD-10-CM

## 2023-11-11 ENCOUNTER — Inpatient Hospital Stay: Attending: Hematology

## 2023-11-11 DIAGNOSIS — Z832 Family history of diseases of the blood and blood-forming organs and certain disorders involving the immune mechanism: Secondary | ICD-10-CM | POA: Diagnosis not present

## 2023-11-11 DIAGNOSIS — Z801 Family history of malignant neoplasm of trachea, bronchus and lung: Secondary | ICD-10-CM | POA: Insufficient documentation

## 2023-11-11 DIAGNOSIS — Z808 Family history of malignant neoplasm of other organs or systems: Secondary | ICD-10-CM | POA: Diagnosis not present

## 2023-11-11 DIAGNOSIS — N9489 Other specified conditions associated with female genital organs and menstrual cycle: Secondary | ICD-10-CM | POA: Insufficient documentation

## 2023-11-11 DIAGNOSIS — R7989 Other specified abnormal findings of blood chemistry: Secondary | ICD-10-CM | POA: Diagnosis not present

## 2023-11-11 DIAGNOSIS — Z87891 Personal history of nicotine dependence: Secondary | ICD-10-CM | POA: Diagnosis not present

## 2023-11-11 DIAGNOSIS — Z8042 Family history of malignant neoplasm of prostate: Secondary | ICD-10-CM | POA: Insufficient documentation

## 2023-11-11 DIAGNOSIS — Z807 Family history of other malignant neoplasms of lymphoid, hematopoietic and related tissues: Secondary | ICD-10-CM | POA: Diagnosis not present

## 2023-11-11 DIAGNOSIS — Z975 Presence of (intrauterine) contraceptive device: Secondary | ICD-10-CM | POA: Diagnosis not present

## 2023-11-11 LAB — IRON AND TIBC
Iron: 80 ug/dL (ref 28–170)
Saturation Ratios: 21 % (ref 10.4–31.8)
TIBC: 376 ug/dL (ref 250–450)
UIBC: 296 ug/dL

## 2023-11-11 LAB — CBC
HCT: 38 % (ref 36.0–46.0)
Hemoglobin: 12.1 g/dL (ref 12.0–15.0)
MCH: 27.6 pg (ref 26.0–34.0)
MCHC: 31.8 g/dL (ref 30.0–36.0)
MCV: 86.8 fL (ref 80.0–100.0)
Platelets: 176 K/uL (ref 150–400)
RBC: 4.38 MIL/uL (ref 3.87–5.11)
RDW: 14.1 % (ref 11.5–15.5)
WBC: 4.3 K/uL (ref 4.0–10.5)
nRBC: 0 % (ref 0.0–0.2)

## 2023-11-11 LAB — FERRITIN: Ferritin: 202 ng/mL (ref 11–307)

## 2023-11-17 ENCOUNTER — Encounter: Payer: Self-pay | Admitting: Physician Assistant

## 2023-11-17 NOTE — Progress Notes (Deleted)
 [facility] [address], [place] [address]   CLINIC:  Medical Oncology/Hematology  PCP:  Zarwolo, Gloria, FNP [address] [place] [address] [phone]   REASON FOR VISIT:  Follow-up for elevated ferritin  PRIOR THERAPY: None  CURRENT THERAPY: Surveillance  INTERVAL HISTORY:   Ms. Mabry 48 y.o. female returns for routine follow-up of elevated ferritin. She was seen for initial consultation by Dr. Rogers on 09/18/2023.  At today's visit, she reports feeling ***.  No recent hospitalizations, surgeries, or changes in baseline health status.  ***She discontinued iron and vitamin C supplements in February 2025.Denecia has taken iron supplements and vitamin C daily for 4 years due to an episode of menorrhagia in 2021. She was diagnosed with uterine fibroid around that time and had an IUD placed. She also notes only having one menstrual cycle this year, which has been regular in the past. Other than one episode of menorrhagia, she has light menstrual cycles or spotting. She has no history of blood transfusions. RH negative blood   She has ***% energy and ***% appetite. She endorses that she is maintaining a stable weight.   ASSESSMENT & PLAN:  1.  Elevated ferritin - Seen at the request of Leita Longs, FNP - 05/27/2023: Ferritin 305, saturation 35 - 08/28/2023: Ferritin 289, saturation 24 - She is taking iron tablet and vitamin C daily for the last 4 years.  Stopped taking iron for few days after the labs on 05/27/2023, took 31 pills prior to repeat labs on 08/28/2023 and then stopped completely.  No prior history of blood transfusion. - Had history of heavy menses in 2021 from fibroids, status post Mirena  with improvement.  She now has spotting/mild bleeding monthly. - Mildly elevated ferritin levels corresponded to iron and vitamin C intake and decreased severity of menstrual bleeding.  Iron saturation remained normal. - Holding iron tablet/vitamin C  completely since 08/28/2023. - Most recent labs (11/11/2023): Normal ferritin 202, iron saturation 21%. - PLAN: Ferritin levels have returned to normal after stopping oral iron supplementation.  No suspicion for hereditary hemochromatosis. - Recommend discharge to PCP.  If patient has any significant elevations in ferritin in the future (ferritin >500 for more than 6 consecutive months in the absence of iron supplementation) would consider referring back for additional hematology evaluation.  2.  Social/Family History: - Quit tobacco use 1 month ago of 0.5 ppd since age 49.  - No family history of hemochromatosis. Mother experienced menopause at age 55. Brother has sickle cell anemia. Maternal aunt has thyroid cancer. Maternal uncle has prostate cancer. Maternal grandfather died from lung cancer. Maternal cousin died from lymphoma.   PLAN SUMMARY: >> *** >> *** >> ***   [facility] **VISIT SUMMARY & IMPORTANT INSTRUCTIONS **   You were seen today by Pleasant Barefoot PA-C for your elevated iron (ferritin) levels.  Your iron levels have returned to normal after you stopped taking iron supplement.  You do not need to restart iron supplements at this time.  You do not need any further appointments at the hematology clinic / Cancer Center.  Follow-up with your primary care provider for ongoing monitoring of other chronic conditions.***  ***Note to PCP:  Metta Mario was seen at hematology clinic for elevated ferritin.  Since ferritin levels have returned to normal after discontinuing iron/vitamin C supplementation, no further hematology workup or follow-up is needed at this time.  Consider referring back to Cancer  Center if she has any severely elevated ferritin in the future (ferritin >500 for 6 consecutive months in the absence of iron supplementation).     REVIEW OF SYSTEMS: ***  Review of Systems - Oncology   PHYSICAL EXAM:  ECOG PERFORMANCE STATUS: {CHL ONC  ECOG ED:[id number]} *** There were no vitals filed for this visit. There were no vitals filed for this visit. Physical Exam  PAST MEDICAL/SURGICAL HISTORY:  Past Medical History:  Diagnosis Date   Anemia 05/31/2014   BV (bacterial vaginosis) 06/16/2015   Depression    Encounter for smoking cessation counseling 06/16/2015   HSV (herpes simplex virus) infection    Hypertension    Smoker 06/16/2015   Umbilical hernia 06/16/2015   Vaginal discharge 05/30/2014   Vitamin D  deficiency 06/19/2015   Yeast infection 05/30/2014   Past Surgical History:  Procedure Laterality Date   CESAREAN SECTION     COLONOSCOPY WITH PROPOFOL  N/A 07/04/2022   Procedure: COLONOSCOPY WITH PROPOFOL ;  Surgeon: Cindie Carlin POUR, DO;  [facility];  Service: Endoscopy;  Laterality: N/A;  11;00 am, asa 2   POLYPECTOMY  07/04/2022   Procedure: POLYPECTOMY;  Surgeon: Cindie Carlin POUR, DO;  [facility];  Service: Endoscopy;;   TUBAL LIGATION      SOCIAL HISTORY:  Social History   Socioeconomic History   Marital status: Legally Separated    Spouse name: Not on file   Number of children: 3   Years of education: Not on file   Highest education level: 10th grade  Occupational History   Not on file  Tobacco Use   Smoking status: Former    Current packs/day: 0.50    Average packs/day: 0.5 packs/day for 18.0 years (9.0 ttl pk-yrs)    Types: Cigarettes    Passive exposure: Past   Smokeless tobacco: Never   Tobacco comments:    1/2 a day.  Vaping Use   Vaping status: Never Used  Substance and Sexual Activity   Alcohol use: No   Drug use: Not Currently    Types: Marijuana    Comment: none since ine her 88s   Sexual activity: Yes    Partners: Male    Birth control/protection: Surgical, I.U.D.    Comment: tubal  Other Topics Concern   Not on file  Social History Narrative   Works at [employer].    Social Drivers of Corporate investment banker Strain: Low Risk  (07/18/2023)    Overall Financial Resource Strain (CARDIA)    Difficulty of Paying Living Expenses: Not very hard  Food Insecurity: No Food Insecurity (09/18/2023)   Hunger Vital Sign    Worried About Running Out of Food in the Last Year: Never true    Ran Out of Food in the Last Year: Never true  Transportation Needs: No Transportation Needs (09/18/2023)   PRAPARE - Administrator, Civil Service (Medical): No    Lack of Transportation (Non-Medical): No  Physical Activity: Insufficiently Active (07/18/2023)   Exercise Vital Sign    Days of Exercise per Week: 3 days    Minutes of Exercise per Session: 30 min  Stress: Stress Concern Present (07/18/2023)   [employer] of Occupational Health - Occupational Stress Questionnaire    Feeling of Stress : Rather much  Social Connections: Socially Integrated (07/18/2023)   Social Connection and Isolation Panel    Frequency of Communication with Friends and Family: More than three times a week    Frequency of Social  Gatherings with Friends and Family: Once a week    Attends Religious Services: More than 4 times per year    Active Member of [employer] or Organizations: No    Attends Engineer, structural: More than 4 times per year    Marital Status: Married  Catering manager Violence: Not At Risk (09/18/2023)   Humiliation, Afraid, Rape, and Kick questionnaire    Fear of Current or Ex-Partner: No    Emotionally Abused: No    Physically Abused: No    Sexually Abused: No    FAMILY HISTORY:  Family History  Problem Relation Age of Onset   Hypertension Mother    Kidney disease Father    Alzheimer's disease Maternal Grandmother    Cancer Maternal Grandfather    Hypertension Brother    Sickle cell anemia Brother    Other Brother        born with fluid on brain   Asthma Son    Breast cancer Neg Hx     CURRENT MEDICATIONS:  Outpatient Encounter Medications as of 11/18/2023  Medication Sig   azelastine  (ASTELIN ) 0.1 % nasal spray Place 1  spray into both nostrils 2 (two) times daily. Use in each nostril as directed   cetirizine (ZYRTEC) 10 MG tablet Take 10 mg by mouth daily.   fluticasone (FLONASE) 50 MCG/ACT nasal spray Place 1 spray into both nostrils daily.   levonorgestrel  (MIRENA ) 20 MCG/DAY IUD 1 each by Intrauterine route once. (Inserted 2021)   naproxen  (NAPROSYN ) 500 MG tablet Take 1 tablet (500 mg total) by mouth 2 (two) times daily as needed.   sertraline  (ZOLOFT ) 25 MG tablet Take 1 tablet by mouth once daily   No facility-administered encounter medications on file as of 11/18/2023.    ALLERGIES:  Allergies  Allergen Reactions   Lisinopril Cough    LABORATORY DATA:  I have reviewed the labs as listed.  CBC    Component Value Date/Time   WBC 4.3 11/11/2023 1415   RBC 4.38 11/11/2023 1415   HGB 12.1 11/11/2023 1415   HGB 12.4 08/28/2023 1002   HCT 38.0 11/11/2023 1415   HCT 39.7 08/28/2023 1002   PLT 176 11/11/2023 1415   PLT 183 08/28/2023 1002   MCV 86.8 11/11/2023 1415   MCV 87 08/28/2023 1002   MCH 27.6 11/11/2023 1415   MCHC 31.8 11/11/2023 1415   RDW 14.1 11/11/2023 1415   RDW 13.6 08/28/2023 1002   LYMPHSABS 2.3 08/28/2023 1002   MONOABS 0.6 12/10/2009 1040   EOSABS 0.0 08/28/2023 1002   BASOSABS 0.0 08/28/2023 1002      Latest Ref Rng & Units 05/27/2023    8:55 AM 11/04/2022    9:08 AM 06/14/2022    9:57 AM  CMP  Glucose 70 - 99 mg/dL 89  88  88   BUN 6 - 24 mg/dL 14  14  10    Creatinine 0.57 - 1.00 mg/dL 9.29  9.25  9.23   Sodium 134 - 144 mmol/L 140  138  141   Potassium 3.5 - 5.2 mmol/L 4.4  3.9  4.1   Chloride 96 - 106 mmol/L 105  103  102   CO2 20 - 29 mmol/L 24  21  23    Calcium 8.7 - 10.2 mg/dL 9.3  8.9  9.8   Total Protein 6.0 - 8.5 g/dL  6.6  7.7   Total Bilirubin 0.0 - 1.2 mg/dL  <9.7  0.4   Alkaline Phos 44 - 121 IU/L  55  53   AST 0 - 40 IU/L  18  21   ALT 0 - 32 IU/L  13  18     DIAGNOSTIC IMAGING:  I have independently reviewed the relevant imaging and discussed  with the patient.   WRAP UP:  All questions were answered. The patient knows to call the clinic with any problems, questions or concerns.  Medical decision making: ***  Time spent on visit: I spent *** minutes counseling the patient face to face. The total time spent in the appointment was *** minutes and more than 50% was on counseling.  Pleasant CHRISTELLA Barefoot, PA-C  ***

## 2023-11-18 ENCOUNTER — Inpatient Hospital Stay: Admitting: Physician Assistant

## 2023-11-20 ENCOUNTER — Other Ambulatory Visit: Payer: Self-pay | Admitting: Family Medicine

## 2023-11-20 ENCOUNTER — Encounter: Payer: Self-pay | Admitting: Family Medicine

## 2023-11-20 ENCOUNTER — Ambulatory Visit (INDEPENDENT_AMBULATORY_CARE_PROVIDER_SITE_OTHER): Payer: BC Managed Care – PPO | Admitting: Family Medicine

## 2023-11-20 VITALS — BP 128/78 | HR 68 | Resp 16 | Ht 60.0 in | Wt 145.0 lb

## 2023-11-20 DIAGNOSIS — M25511 Pain in right shoulder: Secondary | ICD-10-CM | POA: Diagnosis not present

## 2023-11-20 DIAGNOSIS — F339 Major depressive disorder, recurrent, unspecified: Secondary | ICD-10-CM

## 2023-11-20 DIAGNOSIS — M25551 Pain in right hip: Secondary | ICD-10-CM

## 2023-11-20 DIAGNOSIS — T7840XA Allergy, unspecified, initial encounter: Secondary | ICD-10-CM | POA: Diagnosis not present

## 2023-11-20 DIAGNOSIS — G8929 Other chronic pain: Secondary | ICD-10-CM

## 2023-11-20 MED ORDER — GABAPENTIN 100 MG PO CAPS: 100.0000 mg | ORAL_CAPSULE | Freq: Every day | ORAL | 3 refills | Status: AC

## 2023-11-20 MED ORDER — NAPROXEN 500 MG PO TABS: 500.0000 mg | ORAL_TABLET | Freq: Two times a day (BID) | ORAL | 0 refills | Status: DC | PRN

## 2023-11-20 MED ORDER — SERTRALINE HCL 25 MG PO TABS: 25.0000 mg | ORAL_TABLET | Freq: Every day | ORAL | 0 refills | Status: DC

## 2023-11-20 MED ORDER — LEVOCETIRIZINE DIHYDROCHLORIDE 5 MG PO TABS: 5.0000 mg | ORAL_TABLET | Freq: Every evening | ORAL | 1 refills | Status: AC

## 2023-11-20 NOTE — Patient Instructions (Addendum)
 I appreciate the opportunity to provide care to you today!    Follow up:  4 months  Labs: please stop by the lab during the week to get your blood drawn (CBC, CMP, TSH, Lipid profile, HgA1c, Vit D)  Right Hip Pain: -The patient is advised to stop by [facility] for a right hip X-ray to evaluate the source of pain.  Start Gabapentin  100 mg at bedtime for nerve-related discomfort. Start Naproxen  500 mg twice daily with food for pain and inflammation, as tolerated.  Activities to Avoid: -Lying or sleeping on the right side - this can increase pressure on the hip joint and soft tissues. -Prolonged standing or walking - especially on hard surfaces, which may aggravate inflammation or strain. -High-impact activities - such as running, jumping, or aerobic exercises that involve repetitive hip movement. -Stairs or inclines - if they worsen pain. -Crossing legs or sitting in low chairs - can stress the hip joint or surrounding muscles. -Squatting or bending frequently - which may aggravate bursitis or tendon irritation.  Encourage: -Low-impact exercises like walking short distances on flat surfaces, swimming, or stationary cycling (if tolerated). -Using supportive footwear and possibly a cane or walker if ambulation is painful.   Please follow up if your symptoms worsen or fail to improve.    Please continue to a heart-healthy diet and increase your physical activities. Try to exercise for at least five days a week.    It was a pleasure to see you and I look forward to continuing to work together on your health and well-being. Please do not hesitate to call the office if you need care or have questions about your care.  In case of emergency, please visit the Emergency Department for urgent care, or contact our clinic at [phone] to schedule an appointment. We're here to help you!   Have a wonderful day and week. With Gratitude, Chidiebere Wynn MSN, FNP-BC

## 2023-11-20 NOTE — Progress Notes (Unsigned)
 Established Patient Office Visit  Subjective:  Patient ID: [name], female    DOB: 1975/07/03  Age: 48 y.o. MRN: [id number]  CC:  Chief Complaint  Patient presents with  . Hip Pain    Right hip pain. Unable to lay on her right side at night. States it has been bothering her for awhile now and sometimes its a burning sensation and its not improving   . Medication Refill    States another provider had given her an allergy med and she spelled out xyzal  but I don't see it on her list. Wants it prescribed     HPI [name] is a 48 y.o. female with past medical history of *** presents for f/u of *** chronic medical conditions.  Goten works longer than a month   Right hip pain. Unable to lay on her right side at night. States it has been bothering her for awhile now and sometimes its a burning sensation and its not improving  Every now and then has a burning sensation 11/28/08, radiaite down the legs. Burning sensatin Worst at nighttime and slepping  on it.   Worst when layin on it.   No recent injury or trama  Nothing makes it sometimes take naproxen  with provides some relief   Past Medical History:  Diagnosis Date  . Anemia 05/31/2014  . BV (bacterial vaginosis) 06/16/2015  . Depression   . Encounter for smoking cessation counseling 06/16/2015  . HSV (herpes simplex virus) infection   . Hypertension   . Smoker 06/16/2015  . Umbilical hernia 06/16/2015  . Vaginal discharge 05/30/2014  . Vitamin D  deficiency 06/19/2015  . Yeast infection 05/30/2014    Past Surgical History:  Procedure Laterality Date  . CESAREAN SECTION    . COLONOSCOPY WITH PROPOFOL  N/A 07/04/2022   Procedure: COLONOSCOPY WITH PROPOFOL ;  Surgeon: Mikayla Carlin POUR, DO;  [facility];  Service: Endoscopy;  Laterality: N/A;  11;00 am, asa 2  . POLYPECTOMY  07/04/2022   Procedure: POLYPECTOMY;  Surgeon: Mikayla Carlin POUR, DO;  [facility];  Service: Endoscopy;;  . TUBAL LIGATION       Family History  Problem Relation Age of Onset  . Hypertension Mother   . Kidney disease Father   . Alzheimer's disease Maternal Grandmother   . Cancer Maternal Grandfather   . Hypertension Brother   . Sickle cell anemia Brother   . Other Brother        born with fluid on brain  . Asthma Son   . Breast cancer Neg Hx     Social History   Socioeconomic History  . Marital status: Legally Separated    Spouse name: Not on file  . Number of children: 3  . Years of education: Not on file  . Highest education level: 10th grade  Occupational History  . Not on file  Tobacco Use  . Smoking status: Former    Current packs/day: 0.50    Average packs/day: 0.5 packs/day for 18.0 years (9.0 ttl pk-yrs)    Types: Cigarettes    Passive exposure: Past  . Smokeless tobacco: Never  . Tobacco comments:    1/2 a day.  Vaping Use  . Vaping status: Never Used  Substance and Sexual Activity  . Alcohol use: No  . Drug use: Not Currently    Types: Marijuana    Comment: none since ine her 20s  . Sexual activity: Yes    Partners: Male  Birth control/protection: Surgical, I.U.D.    Comment: tubal  Other Topics Concern  . Not on file  Social History Narrative   Works at [employer].    Social Drivers of Corporate investment banker Strain: Low Risk  (07/18/2023)   Overall Financial Resource Strain (CARDIA)   . Difficulty of Paying Living Expenses: Not very hard  Food Insecurity: No Food Insecurity (09/18/2023)   Hunger Vital Sign   . Worried About Programme researcher, broadcasting/film/video in the Last Year: Never true   . Ran Out of Food in the Last Year: Never true  Transportation Needs: No Transportation Needs (09/18/2023)   PRAPARE - Transportation   . Lack of Transportation (Medical): No   . Lack of Transportation (Non-Medical): No  Physical Activity: Insufficiently Active (07/18/2023)   Exercise Vital Sign   . Days of Exercise per Week: 3 days   . Minutes of Exercise per Session: 30 min  Stress: Stress  Concern Present (07/18/2023)   [employer] of Occupational Health - Occupational Stress Questionnaire   . Feeling of Stress : Rather much  Social Connections: Socially Integrated (07/18/2023)   Social Connection and Isolation Panel   . Frequency of Communication with Friends and Family: More than three times a week   . Frequency of Social Gatherings with Friends and Family: Once a week   . Attends Religious Services: More than 4 times per year   . Active Member of Clubs or Organizations: No   . Attends Banker Meetings: More than 4 times per year   . Marital Status: Married  Catering manager Violence: Not At Risk (09/18/2023)   Humiliation, Afraid, Rape, and Kick questionnaire   . Fear of Current or Ex-Partner: No   . Emotionally Abused: No   . Physically Abused: No   . Sexually Abused: No    Outpatient Medications Prior to Visit  Medication Sig Dispense Refill  . azelastine  (ASTELIN ) 0.1 % nasal spray Place 1 spray into both nostrils 2 (two) times daily. Use in each nostril as directed 30 mL 2  . fluticasone (FLONASE) 50 MCG/ACT nasal spray Place 1 spray into both nostrils daily.    . levonorgestrel  (MIRENA ) 20 MCG/DAY IUD 1 each by Intrauterine route once. (Inserted 2021)    . naproxen  (NAPROSYN ) 500 MG tablet Take 1 tablet (500 mg total) by mouth 2 (two) times daily as needed. 60 tablet 0  . sertraline  (ZOLOFT ) 25 MG tablet Take 1 tablet by mouth once daily 30 tablet 0  . cetirizine (ZYRTEC) 10 MG tablet Take 10 mg by mouth daily. (Patient not taking: Reported on 11/20/2023)     No facility-administered medications prior to visit.    Allergies  Allergen Reactions  . Lisinopril Cough    ROS Review of Systems    Objective:    Physical Exam  BP 128/78   Pulse 68   Resp 16   Ht 5' (1.524 m)   Wt 145 lb (65.8 kg)   SpO2 98%   BMI 28.32 kg/m  Wt Readings from Last 3 Encounters:  11/20/23 145 lb (65.8 kg)  09/18/23 139 lb (63 kg)  09/04/23 140 lb 14.4  oz (63.9 kg)    Lab Results  Component Value Date   TSH 1.180 11/04/2022   Lab Results  Component Value Date   WBC 4.3 11/11/2023   HGB 12.1 11/11/2023   HCT 38.0 11/11/2023   MCV 86.8 11/11/2023   PLT 176 11/11/2023   Lab Results  Component  Value Date   NA 140 05/27/2023   K 4.4 05/27/2023   CO2 24 05/27/2023   GLUCOSE 89 05/27/2023   BUN 14 05/27/2023   CREATININE 0.70 05/27/2023   BILITOT <0.2 11/04/2022   ALKPHOS 55 11/04/2022   AST 18 11/04/2022   ALT 13 11/04/2022   PROT 6.6 11/04/2022   ALBUMIN 4.1 11/04/2022   CALCIUM 9.3 05/27/2023   ANIONGAP 10 10/02/2019   EGFR 108 05/27/2023   Lab Results  Component Value Date   CHOL 158 05/27/2023   Lab Results  Component Value Date   HDL 42 05/27/2023   Lab Results  Component Value Date   LDLCALC 104 (H) 05/27/2023   Lab Results  Component Value Date   TRIG 62 05/27/2023   Lab Results  Component Value Date   CHOLHDL 3.8 05/27/2023   Lab Results  Component Value Date   HGBA1C 5.5 05/27/2023      Assessment & Plan:  Depression, recurrent (HCC)    Follow-up: No follow-ups on file.   Pierce Biagini, FNP

## 2023-11-21 ENCOUNTER — Ambulatory Visit (HOSPITAL_COMMUNITY)
Admission: RE | Admit: 2023-11-21 | Discharge: 2023-11-21 | Disposition: A | Discharge status: Home / Self Care | Source: Ambulatory Visit | Attending: Family Medicine | Admitting: Family Medicine

## 2023-11-21 DIAGNOSIS — M25551 Pain in right hip: Secondary | ICD-10-CM | POA: Diagnosis not present

## 2023-11-24 ENCOUNTER — Other Ambulatory Visit: Payer: Self-pay | Admitting: Family Medicine

## 2023-11-24 DIAGNOSIS — R7303 Prediabetes: Secondary | ICD-10-CM

## 2023-11-24 DIAGNOSIS — E559 Vitamin D deficiency, unspecified: Secondary | ICD-10-CM

## 2023-11-25 LAB — CBC WITH DIFFERENTIAL/PLATELET
Basophils Absolute: 0 x10E3/uL (ref 0.0–0.2)
Basos: 0 %
EOS (ABSOLUTE): 0 x10E3/uL (ref 0.0–0.4)
Eos: 1 %
Hematocrit: 38.1 % (ref 34.0–46.6)
Hemoglobin: 11.5 g/dL (ref 11.1–15.9)
Immature Grans (Abs): 0 x10E3/uL (ref 0.0–0.1)
Immature Granulocytes: 0 %
Lymphocytes Absolute: 1.9 x10E3/uL (ref 0.7–3.1)
Lymphs: 47 %
MCH: 26.6 pg (ref 26.6–33.0)
MCHC: 30.2 g/dL — ABNORMAL LOW (ref 31.5–35.7)
MCV: 88 fL (ref 79–97)
Monocytes Absolute: 0.4 x10E3/uL (ref 0.1–0.9)
Monocytes: 9 %
Neutrophils Absolute: 1.8 x10E3/uL (ref 1.4–7.0)
Neutrophils: 43 %
Platelets: 189 x10E3/uL (ref 150–450)
RBC: 4.32 x10E6/uL (ref 3.77–5.28)
RDW: 13.2 % (ref 11.7–15.4)
WBC: 4.1 x10E3/uL (ref 3.4–10.8)

## 2023-11-25 LAB — CMP14+EGFR
ALT: 29 IU/L (ref 0–32)
AST: 21 IU/L (ref 0–40)
Albumin: 4.1 g/dL (ref 3.9–4.9)
Alkaline Phosphatase: 50 IU/L (ref 44–121)
BUN/Creatinine Ratio: 21 (ref 9–23)
BUN: 15 mg/dL (ref 6–24)
Bilirubin Total: 0.2 mg/dL (ref 0.0–1.2)
CO2: 20 mmol/L (ref 20–29)
Calcium: 8.9 mg/dL (ref 8.7–10.2)
Chloride: 104 mmol/L (ref 96–106)
Creatinine, Ser: 0.73 mg/dL (ref 0.57–1.00)
Globulin, Total: 2.3 g/dL (ref 1.5–4.5)
Glucose: 83 mg/dL (ref 70–99)
Potassium: 4.3 mmol/L (ref 3.5–5.2)
Sodium: 140 mmol/L (ref 134–144)
Total Protein: 6.4 g/dL (ref 6.0–8.5)
eGFR: 103 mL/min/1.73 (ref >59)

## 2023-11-25 LAB — HEMOGLOBIN A1C
Est. average glucose Bld gHb Est-mCnc: 111 mg/dL
Hgb A1c MFr Bld: 5.5 % (ref 4.8–5.6)

## 2023-11-25 LAB — LIPID PANEL
Chol/HDL Ratio: 3.4 ratio (ref 0.0–4.4)
Cholesterol, Total: 172 mg/dL (ref 100–199)
HDL: 51 mg/dL (ref >39)
LDL Chol Calc (NIH): 108 mg/dL — ABNORMAL HIGH (ref 0–99)
Triglycerides: 69 mg/dL (ref 0–149)
VLDL Cholesterol Cal: 13 mg/dL (ref 5–40)

## 2023-11-25 LAB — TSH: TSH: 0.828 u[IU]/mL (ref 0.450–4.500)

## 2023-11-25 LAB — VITAMIN D 25 HYDROXY (VIT D DEFICIENCY, FRACTURES): Vit D, 25-Hydroxy: 33.3 ng/mL (ref 30.0–100.0)

## 2023-11-25 NOTE — Assessment & Plan Note (Signed)
 Denies SI/HI and AVH Refilled Zoloft  25 mg daily

## 2023-11-25 NOTE — Assessment & Plan Note (Signed)
 The patient is advised to stop by [facility] for a right hip X-ray to evaluate the source of pain.  Encouraged to start  taking Gabapentin  100 mg at bedtime for nerve-related discomfort. Encouraged to start taking  Naproxen  500 mg twice daily with food for pain and inflammation, as tolerated.  Discussed Activities to Avoid: -Lying or sleeping on the right side - this can increase pressure on the hip joint and soft tissues. -Prolonged standing or walking - especially on hard surfaces, which may aggravate inflammation or strain. -High-impact activities - such as running, jumping, or aerobic exercises that involve repetitive hip movement. -Stairs or inclines - if they worsen pain. -Crossing legs or sitting in low chairs - can stress the hip joint or surrounding muscles. -Squatting or bending frequently - which may aggravate bursitis or tendon irritation.  Encourage: -Low-impact exercises like walking short distances on flat surfaces, swimming, or stationary cycling (if tolerated). -Using supportive footwear and possibly a cane or walker if ambulation is painful.

## 2023-11-27 ENCOUNTER — Ambulatory Visit: Payer: Self-pay | Admitting: Family Medicine

## 2023-12-08 ENCOUNTER — Ambulatory Visit: Payer: Self-pay | Admitting: Family Medicine

## 2024-01-05 ENCOUNTER — Other Ambulatory Visit: Payer: Self-pay | Admitting: Family Medicine

## 2024-01-05 DIAGNOSIS — F339 Major depressive disorder, recurrent, unspecified: Secondary | ICD-10-CM

## 2024-02-09 ENCOUNTER — Ambulatory Visit (INDEPENDENT_AMBULATORY_CARE_PROVIDER_SITE_OTHER): Payer: Self-pay

## 2024-02-09 DIAGNOSIS — Z23 Encounter for immunization: Secondary | ICD-10-CM

## 2024-02-09 DIAGNOSIS — F339 Major depressive disorder, recurrent, unspecified: Secondary | ICD-10-CM

## 2024-02-09 DIAGNOSIS — Z111 Encounter for screening for respiratory tuberculosis: Secondary | ICD-10-CM

## 2024-02-09 MED ORDER — SERTRALINE HCL 25 MG PO TABS: 25.0000 mg | ORAL_TABLET | Freq: Every day | ORAL | 0 refills | Status: DC

## 2024-02-09 NOTE — Addendum Note (Signed)
 Addended by: DYANE MOATS D on: 02/09/2024 09:56 AM   Modules accepted: Orders

## 2024-02-11 LAB — TB SKIN TEST
Induration: 0.01 mm
TB Skin Test: NEGATIVE

## 2024-02-15 ENCOUNTER — Ambulatory Visit: Payer: Self-pay | Admitting: Family Medicine

## 2024-02-15 NOTE — Progress Notes (Signed)
 Please inform the patient her tb skin test was negative

## 2024-03-04 ENCOUNTER — Encounter: Payer: Self-pay | Admitting: Adult Health

## 2024-03-04 ENCOUNTER — Ambulatory Visit (INDEPENDENT_AMBULATORY_CARE_PROVIDER_SITE_OTHER): Admitting: Adult Health

## 2024-03-04 VITALS — BP 138/73 | HR 83 | Ht 60.0 in | Wt 148.0 lb

## 2024-03-04 DIAGNOSIS — G479 Sleep disorder, unspecified: Secondary | ICD-10-CM | POA: Insufficient documentation

## 2024-03-04 DIAGNOSIS — R61 Generalized hyperhidrosis: Secondary | ICD-10-CM | POA: Insufficient documentation

## 2024-03-04 DIAGNOSIS — Z975 Presence of (intrauterine) contraceptive device: Secondary | ICD-10-CM | POA: Diagnosis not present

## 2024-03-04 DIAGNOSIS — R232 Flushing: Secondary | ICD-10-CM | POA: Insufficient documentation

## 2024-03-04 MED ORDER — ESTRADIOL 0.5 MG PO TABS: 0.5000 mg | ORAL_TABLET | Freq: Every day | ORAL | 3 refills | Status: AC

## 2024-03-04 NOTE — Progress Notes (Signed)
  Subjective:     Patient ID: [name], female   DOB: [birth year], 48 y.o.   MRN: [id number]  HPI [name] is a 48 year old black female, separated, [id number] in complaining of hot flashes, night sweats and not sleeping well, wakes up at night. She has had tubal and has IUD to control periods.Hips achy too.     Component Value Date/Time   DIAGPAP  05/28/2022 1137    - Negative for intraepithelial lesion or malignancy (NILM)   HPVHIGH Negative 05/28/2022 1137   ADEQPAP  05/28/2022 1137    Satisfactory for evaluation; transformation zone component PRESENT.    PCP is Meade Gerlach FNP Review of Systems +hot flashes, + night sweats  +not sleeping well, wakes up at night. +Hips achy too. Denies being moody Denies MI,stroke, DVT, Breast cancer or migraine with aura She quit smoking over 6 months ago   Reviewed past medical,surgical, social and family history. Reviewed medications and allergies.  Objective:   Physical Exam BP 138/73 (BP Location: Right Arm, Patient Position: Sitting, Cuff Size: Normal)   Pulse 83   Ht 5' (1.524 m)   Wt 148 lb (67.1 kg)   BMI 28.90 kg/m     Skin warm and dry.  Lungs: clear to ausculation bilaterally. Cardiovascular: regular rate and rhythm. Fall risk is low  Upstream - 03/04/24 1125       Pregnancy Intention Screening   Does the patient want to become pregnant in the next year? No    Does the patient's partner want to become pregnant in the next year? No    Would the patient like to discuss contraceptive options today? No      Contraception Wrap Up   Current Method Female Sterilization;IUD or IUS    End Method Female Sterilization;IUD or IUS    Contraception Counseling Provided Yes           Assessment:     1. Hot flashes (Primary) +hot flashes, some days worse than others Discussed options will try added estrogen Rx estrace 0.5 gm 1 daily  Meds ordered this encounter  Medications   estradiol (ESTRACE) 0.5 MG tablet    Sig: Take 1  tablet (0.5 mg total) by mouth daily.    Dispense:  30 tablet    Refill:  3    Supervising Provider:   JAYNE MINDER H [[id number]]   Follow up 04/05/24 for ROS   2. Night sweats +night sweats   3. Sleep disturbance wakes up at night   4. IUD (intrauterine device) in place Mirena  was placed 06/02/19    Plan:     Follow up 04/05/24 for ROS

## 2024-03-22 ENCOUNTER — Ambulatory Visit: Admitting: Family Medicine

## 2024-03-22 ENCOUNTER — Ambulatory Visit: Payer: Self-pay

## 2024-03-22 NOTE — Telephone Encounter (Signed)
 FYI Only or Action Required?: Action required by provider: update on patient condition.  Patient was last seen in primary care on 11/20/2023 by Edman Meade PEDLAR, FNP.  Called Nurse Triage reporting Hand Pain.  Symptoms began about a month ago.  Interventions attempted: Rest, hydration, or home remedies.  Symptoms are: gradually worsening.  Triage Disposition: See PCP Within 2 Weeks  Patient/caregiver understands and will follow disposition?: Yes  Message from [place] F sent at 03/22/2024 12:24 PM EST  Reason for Triage: ring finger on right hand been locking up, pain goes all up on right arm. PT unable to hold for nurse since she is in class and was calling on her break. Requesting call back after 2PM.   Reason for Disposition  Morning stiffness of fingers is a chronic symptom (recurrent or ongoing AND present > 4 weeks)  Answer Assessment - Initial Assessment Questions For the last month- When she is sleeping, her right ring finger locks up and triggers stiffness all the way up her right arm. Can occur when her arms are raised taking her hair out as well. Denies CP, SOB, Dizziness, color or temp changes, hot hard knot.  Had appt scheduled for today- called Friday and this morning to cancel. She had a class she had to pay for and was unable to make it.  Appt with PCP office MD to assess 12/2. ED/UC precautions advised.    1. ONSET: When did the pain start?      A month  2. LOCATION and RADIATION: Where is the pain located?  (e.g., fingertip, around nail, joint, entire      Right ring finger- all the way up to her shoulder- stiffness 3. SEVERITY: How bad is the pain? What does it keep you from doing?   (Scale 1-10; or mild, moderate, severe)     6/10 stiffness/soreness 4. APPEARANCE: What does the finger look like? (e.g., redness, swelling, bruising, pallor)     Slight swelling intermittently  5. WORK OR EXERCISE: Has there been any recent work or exercise that involved  this part (i.e., fingers or hand) of the body?     Sleeps on the arm occasionally, when taking her hair out it gets stiff after  6. CAUSE: What do you think is causing the pain?     unsure 7. AGGRAVATING FACTORS: What makes the pain worse? (e.g., using computer)     Taking out hair- having arms up for long periods  8. OTHER SYMPTOMS: Do you have any other symptoms? (e.g., fever, neck pain, numbness)     denies 9. PREGNANCY: Is there any chance you are pregnant? When was your last menstrual period?     Denies  Protocols used: Finger Pain-A-AH

## 2024-03-22 NOTE — Telephone Encounter (Signed)
 Noted patient scheduled 12/2 with dr antonetta

## 2024-03-23 ENCOUNTER — Ambulatory Visit: Payer: Self-pay | Admitting: Family Medicine

## 2024-04-05 ENCOUNTER — Ambulatory Visit: Admitting: Adult Health

## 2024-04-06 ENCOUNTER — Other Ambulatory Visit: Payer: Self-pay | Admitting: Family Medicine

## 2024-04-06 ENCOUNTER — Encounter: Payer: Self-pay | Admitting: Family Medicine

## 2024-04-06 DIAGNOSIS — G8929 Other chronic pain: Secondary | ICD-10-CM

## 2024-04-06 NOTE — Telephone Encounter (Signed)
 Please advise

## 2024-04-12 ENCOUNTER — Telehealth: Payer: Self-pay | Admitting: Family Medicine

## 2024-04-12 NOTE — Telephone Encounter (Signed)
 Copied from CRM [id number]. Topic: Appointments - Scheduling Inquiry for Clinic >> Apr 12, 2024  2:45 PM Winona R wrote: Pt would like to switch providers. Pt feels like she never see NP Bacchus and is now in need for a in person appointment but her appointment was switched to virtual. Pt states she need her hand locked at because eit become stiff randomly and being seen virtually is not helpful. Pt would like to know how can she be seen asap and also transfer her care.

## 2024-04-13 NOTE — Telephone Encounter (Signed)
 Okay to switch, but I don't know when she will be able to get an appt

## 2024-04-13 NOTE — Telephone Encounter (Signed)
 Called and scheduled with Leita in April

## 2024-04-21 ENCOUNTER — Ambulatory Visit: Payer: Self-pay | Admitting: Nurse Practitioner

## 2024-04-21 ENCOUNTER — Encounter: Payer: Self-pay | Admitting: Nurse Practitioner

## 2024-04-21 VITALS — BP 130/85 | HR 103 | Ht 59.0 in | Wt 150.0 lb

## 2024-04-21 DIAGNOSIS — J029 Acute pharyngitis, unspecified: Secondary | ICD-10-CM | POA: Diagnosis not present

## 2024-04-21 DIAGNOSIS — J020 Streptococcal pharyngitis: Secondary | ICD-10-CM | POA: Diagnosis not present

## 2024-04-21 DIAGNOSIS — M79644 Pain in right finger(s): Secondary | ICD-10-CM | POA: Diagnosis not present

## 2024-04-21 MED ORDER — AMOXICILLIN 875 MG PO TABS: 875.0000 mg | ORAL_TABLET | Freq: Two times a day (BID) | ORAL | 0 refills | Status: AC

## 2024-04-21 NOTE — Patient Instructions (Signed)

## 2024-04-21 NOTE — Progress Notes (Signed)
 "  Subjective:    Patient ID: [name], female    DOB: [birth year], 48 y.o.   MRN: [id number]   Chief Complaint: Finger Injury (Finger gets stuck , painful ), Sore Throat (Sore throat for two days ), and body stiffness (Stiff, sore body )   HPI Patient in with 2 complaints: - sore throat- started yesterday. Has not been around anyone that is sick. Waxing and waning. Painful to swallow. Rates pain 8/10 currently. Low grade fever- 99.2 last night.took some tylenom cold and flu and only helped for a little bit. - right ring finger gets stiff and pops when she opens and closes it. Has been going on for a couple of months. Takes naproxen  which helps some. Rates pain 6/10 when it is at its worse.  Patient Active Problem List   Diagnosis Date Noted   Sleep disturbance 03/04/2024   Night sweats 03/04/2024   Hot flashes 03/04/2024   Elevated ferritin 08/29/2023   Varicose veins of both lower extremities with pain 07/02/2023   Sinus infection 05/23/2023   Prediabetes 05/23/2023   Eustachian tube dysfunction 04/17/2023   Cellulitis of right finger 03/05/2023   Intramural uterine fibroid 02/19/2023   Yeast infection 02/12/2023   BV (bacterial vaginosis) 02/12/2023   RLQ discomfort 02/12/2023   Breakthrough bleeding with IUD 02/12/2023   Elevated BP without diagnosis of hypertension 02/12/2023   Onychomycosis of toenail 01/25/2023   Chronic right shoulder pain 11/02/2022   Right hip pain 08/03/2022   Bunion of great toe of right foot 08/03/2022   Back pain of lumbar region with sciatica 06/16/2022   Chronic thumb pain, left 06/16/2022   Depression, recurrent 06/16/2022   IUD (intrauterine device) in place 05/04/2021   Vitamin D  deficiency 06/19/2015   Smoker 06/16/2015   Umbilical hernia without obstruction and without gangrene 06/16/2015   Encounter for insertion of Mirena  IUD 06/16/2015   Anemia 05/31/2014   Visit for routine gyn exam 11/15/2013   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 Negative for chills.  HENT:  Positive for congestion, sore throat and trouble swallowing. Negative for ear pain and sinus pressure.   Respiratory:  Negative for cough.   Musculoskeletal:  Positive for arthralgias (right ring finger).       Objective:   Physical Exam Constitutional:      Appearance: She is well-developed.  HENT:     Right Ear: Tympanic membrane normal.     Left Ear: Tympanic membrane normal.     Nose: Congestion present. No rhinorrhea.     Mouth/Throat:     Pharynx: Oropharyngeal exudate and posterior oropharyngeal erythema present.  Cardiovascular:     Rate and Rhythm: Normal rate and regular rhythm.     Heart sounds: Normal heart sounds.  Pulmonary:     Breath sounds: Normal breath sounds.  Musculoskeletal:     Cervical back: Normal range of motion and neck supple.     Comments: Right ring finger mildly edematous FROM with slight pain in flexion and extension Grips equal bil hands  Skin:    General: Skin is warm.  Neurological:     General: No focal deficit present.     Mental Status: She is alert and oriented to person, place, and time.  Psychiatric:        Mood and Affect: Mood normal.        Behavior: Behavior normal.  BP 130/85   Pulse (!) 103   Ht 4' 11 (1.499 m)   Wt 150 lb (68 kg)   SpO2 99%   BMI 30.30 kg/m   Strep positive     Assessment & Plan:  [name] in today with chief complaint of Finger Injury (Finger gets stuck , painful ), Sore Throat (Sore throat for two days ), and body stiffness (Stiff, sore body )   1. Sore throat (Primary) - Rapid Strep Screen ([facility] ONLY)  2. Strep pharyngitis Force fluids Motrin or tylenol  OTC OTC decongestant Throat lozenges if help New toothbrush in 3 days  - amoxicillin (AMOXIL) 875 MG tablet; Take 1 tablet (875 mg total) by mouth 2 (two) times daily. 1 po BID  Dispense: 20 tablet; Refill: 0  3.  Finger pain, right Soak in warm epsom salt when needed' naprosyn  as needed' if worsens will need to see ortho    The above assessment and management plan was discussed with the patient. The patient verbalized understanding of and has agreed to the management plan. Patient is aware to call the clinic if symptoms persist or worsen. Patient is aware when to return to the clinic for a follow-up visit. Patient educated on when it is appropriate to go to the emergency department.   Mary-Margaret Gladis, FNP    "

## 2024-04-22 LAB — RAPID STREP SCREEN (MED CTR MEBANE ONLY): Strep Gp A Ag, IA W/Reflex: POSITIVE — AB

## 2024-04-23 ENCOUNTER — Ambulatory Visit: Payer: Self-pay | Admitting: Nurse Practitioner

## 2024-04-27 ENCOUNTER — Ambulatory Visit: Admitting: Adult Health

## 2024-04-28 ENCOUNTER — Ambulatory Visit: Payer: Self-pay | Admitting: Nurse Practitioner

## 2024-05-06 ENCOUNTER — Ambulatory Visit: Admitting: Family Medicine

## 2024-05-20 ENCOUNTER — Other Ambulatory Visit: Payer: Self-pay | Admitting: Family Medicine

## 2024-05-20 DIAGNOSIS — F339 Major depressive disorder, recurrent, unspecified: Secondary | ICD-10-CM

## 2024-07-28 ENCOUNTER — Ambulatory Visit: Payer: Self-pay
